# Patient Record
Sex: Female | Born: 1960 | Race: White | Hispanic: No | Smoking: Never smoker
Health system: Southern US, Community
[De-identification: ages and names within clinical notes are randomized; demographics above are authoritative.]

## PROBLEM LIST (undated history)

## (undated) DIAGNOSIS — R1900 Intra-abdominal and pelvic swelling, mass and lump, unspecified site: Secondary | ICD-10-CM

## (undated) DIAGNOSIS — N809 Endometriosis, unspecified: Secondary | ICD-10-CM

## (undated) DIAGNOSIS — M436 Torticollis: Secondary | ICD-10-CM

## (undated) DIAGNOSIS — F418 Other specified anxiety disorders: Secondary | ICD-10-CM

## (undated) DIAGNOSIS — F419 Anxiety disorder, unspecified: Secondary | ICD-10-CM

## (undated) DIAGNOSIS — C801 Malignant (primary) neoplasm, unspecified: Secondary | ICD-10-CM

## (undated) DIAGNOSIS — N80129 Deep endometriosis of ovary, unspecified ovary: Secondary | ICD-10-CM

## (undated) HISTORY — DX: Malignant (primary) neoplasm, unspecified: C80.1

## (undated) HISTORY — DX: Other specified anxiety disorders: F41.8

## (undated) HISTORY — PX: OTHER SURGICAL HISTORY: SHX169

---

## 1998-03-18 ENCOUNTER — Encounter: Payer: Self-pay | Admitting: Obstetrics and Gynecology

## 1998-03-18 ENCOUNTER — Ambulatory Visit (HOSPITAL_COMMUNITY): Admission: RE | Admit: 1998-03-18 | Discharge: 1998-03-18 | Payer: Self-pay | Admitting: Obstetrics and Gynecology

## 2000-01-23 ENCOUNTER — Other Ambulatory Visit: Admission: RE | Admit: 2000-01-23 | Discharge: 2000-01-23 | Payer: Self-pay | Admitting: Gynecology

## 2001-03-29 ENCOUNTER — Other Ambulatory Visit: Admission: RE | Admit: 2001-03-29 | Discharge: 2001-03-29 | Payer: Self-pay | Admitting: Gynecology

## 2001-10-03 ENCOUNTER — Ambulatory Visit (HOSPITAL_COMMUNITY): Admission: RE | Admit: 2001-10-03 | Discharge: 2001-10-03 | Payer: Self-pay | Admitting: Family Medicine

## 2001-10-03 ENCOUNTER — Encounter: Payer: Self-pay | Admitting: Family Medicine

## 2001-10-18 ENCOUNTER — Encounter: Admission: RE | Admit: 2001-10-18 | Discharge: 2001-10-18 | Payer: Self-pay | Admitting: Family Medicine

## 2001-10-18 ENCOUNTER — Encounter: Payer: Self-pay | Admitting: Family Medicine

## 2001-11-29 ENCOUNTER — Encounter: Payer: Self-pay | Admitting: Family Medicine

## 2001-11-29 ENCOUNTER — Encounter: Admission: RE | Admit: 2001-11-29 | Discharge: 2001-11-29 | Payer: Self-pay | Admitting: Family Medicine

## 2002-07-18 ENCOUNTER — Other Ambulatory Visit: Admission: RE | Admit: 2002-07-18 | Discharge: 2002-07-18 | Payer: Self-pay | Admitting: Gynecology

## 2003-10-09 ENCOUNTER — Other Ambulatory Visit: Admission: RE | Admit: 2003-10-09 | Discharge: 2003-10-09 | Payer: Self-pay | Admitting: Gynecology

## 2005-03-02 ENCOUNTER — Other Ambulatory Visit: Admission: RE | Admit: 2005-03-02 | Discharge: 2005-03-02 | Payer: Self-pay | Admitting: Gynecology

## 2005-03-30 ENCOUNTER — Ambulatory Visit (HOSPITAL_BASED_OUTPATIENT_CLINIC_OR_DEPARTMENT_OTHER): Admission: RE | Admit: 2005-03-30 | Discharge: 2005-03-30 | Payer: Self-pay | Admitting: Gynecology

## 2005-03-30 HISTORY — PX: OTHER SURGICAL HISTORY: SHX169

## 2006-05-12 ENCOUNTER — Other Ambulatory Visit: Admission: RE | Admit: 2006-05-12 | Discharge: 2006-05-12 | Payer: Self-pay | Admitting: Gynecology

## 2010-02-02 ENCOUNTER — Encounter: Payer: Self-pay | Admitting: Family Medicine

## 2010-06-17 ENCOUNTER — Other Ambulatory Visit: Payer: Self-pay | Admitting: Gynecology

## 2010-08-11 ENCOUNTER — Other Ambulatory Visit: Payer: Self-pay | Admitting: Physician Assistant

## 2010-08-11 ENCOUNTER — Ambulatory Visit
Admission: RE | Admit: 2010-08-11 | Discharge: 2010-08-11 | Disposition: A | Discharge status: Home / Self Care | Payer: 59 | Source: Ambulatory Visit | Attending: Physician Assistant | Admitting: Physician Assistant

## 2010-08-11 DIAGNOSIS — M542 Cervicalgia: Secondary | ICD-10-CM

## 2011-05-17 ENCOUNTER — Inpatient Hospital Stay (HOSPITAL_COMMUNITY): Payer: 59

## 2011-05-17 ENCOUNTER — Emergency Department (HOSPITAL_COMMUNITY)
Admission: EM | Admit: 2011-05-17 | Discharge: 2011-05-17 | Disposition: A | Discharge status: Home / Self Care | Payer: 59 | Source: Home / Self Care | Attending: Family Medicine | Admitting: Family Medicine

## 2011-05-17 ENCOUNTER — Inpatient Hospital Stay (HOSPITAL_COMMUNITY)
Admission: AD | Admit: 2011-05-17 | Discharge: 2011-05-18 | Disposition: A | Discharge status: Home / Self Care | Payer: 59 | Source: Ambulatory Visit | Attending: Gynecology | Admitting: Gynecology

## 2011-05-17 ENCOUNTER — Encounter (HOSPITAL_COMMUNITY): Payer: Self-pay | Admitting: *Deleted

## 2011-05-17 DIAGNOSIS — D259 Leiomyoma of uterus, unspecified: Secondary | ICD-10-CM | POA: Insufficient documentation

## 2011-05-17 DIAGNOSIS — R109 Unspecified abdominal pain: Secondary | ICD-10-CM | POA: Insufficient documentation

## 2011-05-17 DIAGNOSIS — R102 Pelvic and perineal pain: Secondary | ICD-10-CM

## 2011-05-17 DIAGNOSIS — N83209 Unspecified ovarian cyst, unspecified side: Secondary | ICD-10-CM

## 2011-05-17 DIAGNOSIS — N83201 Unspecified ovarian cyst, right side: Secondary | ICD-10-CM

## 2011-05-17 DIAGNOSIS — N949 Unspecified condition associated with female genital organs and menstrual cycle: Secondary | ICD-10-CM

## 2011-05-17 HISTORY — DX: Anxiety disorder, unspecified: F41.9

## 2011-05-17 LAB — DIFFERENTIAL
Eosinophils Absolute: 0 10*3/uL (ref 0.0–0.7)
Eosinophils Relative: 0 % (ref 0–5)
Lymphocytes Relative: 10 % — ABNORMAL LOW (ref 12–46)
Lymphs Abs: 1.1 10*3/uL (ref 0.7–4.0)
Monocytes Relative: 5 % (ref 3–12)

## 2011-05-17 LAB — POCT URINALYSIS DIP (DEVICE)
Glucose, UA: NEGATIVE mg/dL
Ketones, ur: >=160 mg/dL — AB
Specific Gravity, Urine: 1.025 (ref 1.005–1.030)

## 2011-05-17 LAB — CBC
HCT: 42.2 % (ref 36.0–46.0)
Hemoglobin: 14.1 g/dL (ref 12.0–15.0)
MCH: 30.7 pg (ref 26.0–34.0)
MCHC: 33.4 g/dL (ref 30.0–36.0)
MCV: 91.7 fL (ref 78.0–100.0)
Platelets: 334 10*3/uL (ref 150–400)
RBC: 4.6 MIL/uL (ref 3.87–5.11)
RDW: 13.1 % (ref 11.5–15.5)
WBC: 11.2 10*3/uL — ABNORMAL HIGH (ref 4.0–10.5)

## 2011-05-17 NOTE — MAU Note (Signed)
Pt in c/o 4-5 episodes of right sided mid abdominal "spasm-like" pain since yesterday around 1730.  States she had not had a period in over a year besides a couple times of spotting for one day.  States she started having a normal period 9 days ago, now starting to subside.  Was seen at urgent care and told to come over here.

## 2011-05-17 NOTE — ED Provider Notes (Signed)
History     CSN: [id number]  Arrival date & time 05/17/11  1656   First MD Initiated Contact with Patient 05/17/11 1713      Chief Complaint  Patient presents with  . Abdominal Pain    (Consider location/radiation/quality/duration/timing/severity/associated sxs/prior treatment) Patient is a 51 y.o. female presenting with abdominal pain. The history is provided by the patient.  Abdominal Pain The primary symptoms of the illness include abdominal pain, fever, diarrhea and vaginal bleeding. The primary symptoms of the illness do not include fatigue, nausea, vomiting or vaginal discharge. The current episode started yesterday. The onset of the illness was sudden. The problem has been gradually worsening.  Associated with: very vague about lnmp being more or less than 1 yr. The patient states that she believes she is currently not pregnant. The patient has had a change in bowel habit. Additional symptoms associated with the illness include anorexia.    Past Medical History  Diagnosis Date  . Anxiety   . Fibroid     Past Surgical History  Procedure Date  . Cystectomy     History reviewed. No pertinent family history.  History  Substance Use Topics  . Smoking status: Never Smoker   . Smokeless tobacco: Not on file  . Alcohol Use: Yes    OB History    Grav Para Term Preterm Abortions TAB SAB Ect Mult Living                  Review of Systems  Constitutional: Positive for fever. Negative for fatigue.  Gastrointestinal: Positive for abdominal pain, diarrhea and anorexia. Negative for nausea and vomiting.  Genitourinary: Positive for vaginal bleeding and pelvic pain. Negative for flank pain, vaginal discharge and vaginal pain.    Allergies  Review of patient's allergies indicates no known allergies.  Home Medications   Current Outpatient Rx  Name Route Sig Dispense Refill  . ACETAMINOPHEN 325 MG PO TABS Oral Take 325 mg by mouth every 6 (six) hours as needed.    .  ALPRAZOLAM 0.5 MG PO TABS Oral Take 0.5 mg by mouth 1 day or 1 dose.      BP 145/85  Pulse 104  Temp(Src) 99.3 F (37.4 C) (Oral)  Resp 21  SpO2 100%  LMP 05/08/2011  Physical Exam  Nursing note and vitals reviewed. Constitutional: She appears well-developed and well-nourished.  Abdominal: Soft. Bowel sounds are normal. She exhibits distension. There is tenderness. There is guarding.    Genitourinary:       ED Course  Procedures (including critical care time)  Labs Reviewed  POCT URINALYSIS DIP (DEVICE) - Abnormal; Notable for the following:    Bilirubin Urine SMALL (*)    Ketones, ur >=160 (*)    Hgb urine dipstick SMALL (*)    Protein, ur 30 (*)    All other components within normal limits   No results found.   1. Acute pelvic pain, female       MDM          Linna Hoff, MD 05/17/11 1820

## 2011-05-17 NOTE — MAU Provider Note (Signed)
History     CSN: [id number]  Arrival date & time 05/17/11  9562   None     Chief Complaint  Patient presents with  . Abdominal Pain    (Consider location/radiation/quality/duration/timing/severity/associated sxs/prior treatment) HPI Kathryn [name] is a 51 y.o.  [id number]. She presents with R side/mid apin since 5/4 starting around 5:30 pm. She has had 3-4 episode sof extreme pain, unabl eto move. It hurst to move or laugh. No change in discharge, UTI S&S. Feel nauseated and like has low grade fever with thep ain. She had 1st menses in 1 yr 4/26 x 7-9 days, changed a pad 1-2x/day, occ clot , no cramping. Last sexual contact 8-9 yr ago. Known uterine fibroids.  Past Medical History  Diagnosis Date  . Anxiety   . Fibroid     Past Surgical History  Procedure Date  . Cystectomy     Family History  Problem Relation Age of Onset  . Anesthesia problems Neg Hx     History  Substance Use Topics  . Smoking status: Never Smoker   . Smokeless tobacco: Not on file  . Alcohol Use: Yes    OB History    Grav Para Term Preterm Abortions TAB SAB Ect Mult Living   3 2 2  0 1 0 1 0 0 2      Review of Systems  Constitutional: Negative for fever and chills.  Gastrointestinal:       Abd pain  Genitourinary: Negative for vaginal bleeding, vaginal discharge and vaginal pain.    Allergies  Review of patient's allergies indicates no known allergies.  Home Medications  No current outpatient prescriptions on file.  BP 146/93  Pulse 108  Temp(Src) 100.9 F (38.3 C) (Oral)  Resp 18  Ht 5' 2.5" (1.588 m)  Wt 140 lb (63.504 kg)  BMI 25.20 kg/m2  LMP 05/08/2011  Physical Exam  Constitutional: She is oriented to person, place, and time. She appears well-developed and well-nourished. No distress.  HENT:  Head: Normocephalic.  Cardiovascular: Normal rate.   Pulmonary/Chest: Effort normal.  Abdominal: Soft. She exhibits no distension and no mass. There is tenderness (Right mid  abdomen). There is no rebound and no guarding.  Genitourinary: Vagina normal and uterus normal. No vaginal discharge (No blood) found.       Uterus enlarged, retroverted  Musculoskeletal: Normal range of motion.  Neurological: She is alert and oriented to person, place, and time.  Skin: Skin is warm and dry.  Psychiatric: She has a normal mood and affect.    ED Course  Procedures (including critical care time)   Labs Reviewed  CBC   [place] Results:  Renal US shows small cyst on left kidney, normal on right with bilateral jets. US Renal  05/17/2011  The *RADIOLOGY REPORT*  Clinical Data: Right abdominal pain.  RENAL / URINARY TRACT ULTRASOUND  Technique:  Complete ultrasound exam of the kidneys and urinary bladder was performed.  Comparison: No comparison studies available.  Findings:  The right kidney measures 10.1 cm in Erler axis.  The left kidney measures 10.3 cm.  The right kidney is unremarkable.  16 mm cyst is identified in the upper pole of the left kidney.  Midline imaging of the pelvis reveals an unremarkable urinary bladder.  Impression:  Small left renal cyst.  Otherwise unremarkable urinary tract ultrasound.  Original Report Authenticated By: ERIC A. MANSELL, M.D.   Pelvic US shows bilateral complex ovarian cysts, two on left, one on right. 4 fibroids,  all around 2cm.  MDM  Care to Artelia Laroche, CNM Discussed with Dr Audie Box He feels it is probably GI Will check pelvic and renal ultrasound. If normal will have pt go home on supportive care and if pain worsens go to [facility] ED  A:  Abdominal pain      Bilateral Complex ovarian cysts      Uterine Fibroids P:  DIscharge home.       Instructed to call Dr Nicholas Lose tomorrow to schedule f/u appt        To go to ED if pain or other symptoms worsen.

## 2011-05-17 NOTE — ED Notes (Signed)
[facility] evaluation vaginal bleeding and abd pain - report given to [place] [facility]U

## 2011-05-17 NOTE — ED Notes (Signed)
Pt with onset of right sided abdominal pain yesterday 530pm - pt describes pain as a spasm pain radiates across abdomen - nausea with pain - abdomen tender  To palpation right mid to lower abd - pt has had no solid foods today drinking water and coffee - denies urinary symptoms - denies constipation - BM x 2 today last loose - per pt had not had a period over one year -  Lasted approx 9 days pt had been taking flax seed thought had brought period on - continues to have very light brownish discharge

## 2011-05-17 NOTE — Discharge Instructions (Signed)
Go directly to [facility] for further eval., do not eat or drink  Anything  Until seen.

## 2011-05-17 NOTE — ED Notes (Signed)
Dr Artis Flock spoke with NP at [facility]

## 2011-05-17 NOTE — MAU Note (Signed)
Pt sent from urgent care c/o having pain on mid lower right side tha radiates down towads her pelvis. Had not had a period for a little over a year and then started having vaginal bleeding 9 days ago that has since slowed down and more brownish (lasted like a period) stated she had satrted taking flax seed oil prior to the vaginal bleeding started.

## 2011-05-18 ENCOUNTER — Telehealth: Payer: Self-pay | Admitting: Gynecology

## 2011-05-18 LAB — POCT PREGNANCY, URINE: Preg Test, Ur: NEGATIVE

## 2011-05-18 MED ORDER — HYDROCODONE-ACETAMINOPHEN 5-325 MG PO TABS: 1.0000 | ORAL_TABLET | Freq: Four times a day (QID) | ORAL | Status: AC | PRN

## 2011-05-18 NOTE — Discharge Instructions (Signed)
Ovarian Cyst The ovaries are small organs that are on each side of the uterus. The ovaries are the organs that produce the female hormones, estrogen and progesterone. An ovarian cyst is a sac filled with fluid that can vary in its size. It is normal for a small cyst to form in women who are in the childbearing age and who have menstrual periods. This type of cyst is called a follicle cyst that becomes an ovulation cyst (corpus luteum cyst) after it produces the women's egg. It later goes away on its own if the woman does not become pregnant. There are other kinds of ovarian cysts that may cause problems and may need to be treated. The most serious problem is a cyst with cancer. It should be noted that menopausal women who have an ovarian cyst are at a higher risk of it being a cancer cyst. They should be evaluated very quickly, thoroughly and followed closely. This is especially true in menopausal women because of the high rate of ovarian cancer in women in menopause. CAUSES AND TYPES OF OVARIAN CYSTS:  FUNCTIONAL CYST: The follicle/corpus luteum cyst is a functional cyst that occurs every month during ovulation with the menstrual cycle. They go away with the next menstrual cycle if the woman does not get pregnant. Usually, there are no symptoms with a functional cyst.   ENDOMETRIOMA CYST: This cyst develops from the lining of the uterus tissue. This cyst gets in or on the ovary. It grows every month from the bleeding during the menstrual period. It is also called a "chocolate cyst" because it becomes filled with blood that turns brown. This cyst can cause pain in the lower abdomen during intercourse and with your menstrual period.   CYSTADENOMA CYST: This cyst develops from the cells on the outside of the ovary. They usually are not cancerous. They can get very big and cause lower abdomen pain and pain with intercourse. This type of cyst can twist on itself, cut off its blood supply and cause severe pain.  It also can easily rupture and cause a lot of pain.   DERMOID CYST: This type of cyst is sometimes found in both ovaries. They are found to have different kinds of body tissue in the cyst. The tissue includes skin, teeth, hair, and/or cartilage. They usually do not have symptoms unless they get very big. Dermoid cysts are rarely cancerous.   POLYCYSTIC OVARY: This is a rare condition with hormone problems that produces many small cysts on both ovaries. The cysts are follicle-like cysts that never produce an egg and become a corpus luteum. It can cause an increase in body weight, infertility, acne, increase in body and facial hair and lack of menstrual periods or rare menstrual periods. Many women with this problem develop type 2 diabetes. The exact cause of this problem is unknown. A polycystic ovary is rarely cancerous.   THECA LUTEIN CYST: Occurs when too much hormone (human chorionic gonadotropin) is produced and over-stimulates the ovaries to produce an egg. They are frequently seen when doctors stimulate the ovaries for invitro-fertilization (test tube babies).   LUTEOMA CYST: This cyst is seen during pregnancy. Rarely it can cause an obstruction to the birth canal during labor and delivery. They usually go away after delivery.  SYMPTOMS   Pelvic pain or pressure.   Pain during sexual intercourse.   Increasing girth (swelling) of the abdomen.   Abnormal menstrual periods.   Increasing pain with menstrual periods.   You stop having   menstrual periods and you are not pregnant.  DIAGNOSIS  The diagnosis can be made during:  Routine or annual pelvic examination (common).   Ultrasound.   X-ray of the pelvis.   CT Scan.   MRI.   Blood tests.  TREATMENT   Treatment may only be to follow the cyst monthly for 2 to 3 months with your caregiver. Many go away on their own, especially functional cysts.   May be aspirated (drained) with a Giammarco needle with ultrasound, or by laparoscopy  (inserting a tube into the pelvis through a small incision).   The whole cyst can be removed by laparoscopy.   Sometimes the cyst may need to be removed through an incision in the lower abdomen.   Hormone treatment is sometimes used to help dissolve certain cysts.   Birth control pills are sometimes used to help dissolve certain cysts.  HOME CARE INSTRUCTIONS  Follow your caregiver's advice regarding:  Medicine.   Follow up visits to evaluate and treat the cyst.   You may need to come back or make an appointment with another caregiver, to find the exact cause of your cyst, if your caregiver is not a gynecologist.   Get your yearly and recommended pelvic examinations and Pap tests.   Let your caregiver know if you have had an ovarian cyst in the past.  SEEK MEDICAL CARE IF:   Your periods are late, irregular, they stop, or are painful.   Your stomach (abdomen) or pelvic pain does not go away.   Your stomach becomes larger or swollen.   You have pressure on your bladder or trouble emptying your bladder completely.   You have painful sexual intercourse.   You have feelings of fullness, pressure, or discomfort in your stomach.   You lose weight for no apparent reason.   You feel generally ill.   You become constipated.   You lose your appetite.   You develop acne.   You have an increase in body and facial hair.   You are gaining weight, without changing your exercise and eating habits.   You think you are pregnant.  SEEK IMMEDIATE MEDICAL CARE IF:   You have increasing abdominal pain.   You feel sick to your stomach (nausea) and/or vomit.   You develop a fever that comes on suddenly.   You develop abdominal pain during a bowel movement.   Your menstrual periods become heavier than usual.  Document Released: 12/29/2004 Document Revised: 12/18/2010 Document Reviewed: 11/01/2008 Exit[employer] Patient Information 2012 [employer]. 

## 2011-05-18 NOTE — Telephone Encounter (Signed)
Pt informed with the below note, u/s results faxed to Dr. Nicholas Lose office. Pt will follow up as directed.

## 2011-05-18 NOTE — Telephone Encounter (Signed)
Please call the patient.  I was on call last night when the patient was evaluated in the triage area by the nurse practitioner. Her ultrasound showed cysts on both of her ovaries which may account for her pain and these definitely need to be followed up. I know that they told her to follow up with Dr. Nicholas Lose but I want to emphasize the need to do so to rule out ovarian tumors and that she needs to call his office to make an appointment to see him. I also want a copy of the ultrasound report faxed to his office as he does not have access to EMR.

## 2011-05-19 ENCOUNTER — Other Ambulatory Visit: Payer: Self-pay | Admitting: Gynecology

## 2011-05-19 DIAGNOSIS — N9489 Other specified conditions associated with female genital organs and menstrual cycle: Secondary | ICD-10-CM

## 2011-05-26 ENCOUNTER — Ambulatory Visit
Admission: RE | Admit: 2011-05-26 | Discharge: 2011-05-26 | Disposition: A | Discharge status: Home / Self Care | Payer: 59 | Source: Ambulatory Visit | Attending: Gynecology | Admitting: Gynecology

## 2011-05-26 DIAGNOSIS — N9489 Other specified conditions associated with female genital organs and menstrual cycle: Secondary | ICD-10-CM

## 2011-05-26 MED ORDER — GADOBENATE DIMEGLUMINE 529 MG/ML IV SOLN
12.0000 mL | Freq: Once | INTRAVENOUS | Status: AC | PRN
  Administered 2011-05-26: 12 mL via INTRAVENOUS

## 2011-06-22 ENCOUNTER — Encounter (HOSPITAL_BASED_OUTPATIENT_CLINIC_OR_DEPARTMENT_OTHER): Payer: Self-pay | Admitting: *Deleted

## 2011-06-22 NOTE — Progress Notes (Signed)
NPO AFTER MN. ARRIVES AT 0615. NEEDS HG AND EKG. WILL TAKE XANAX AM OF SURG W/ SIP OF WATER. REVIEWED [facility] GUIDELINES.  MD PRE-OP ORDERS PENDING.

## 2011-06-29 ENCOUNTER — Ambulatory Visit (HOSPITAL_BASED_OUTPATIENT_CLINIC_OR_DEPARTMENT_OTHER): Payer: 59 | Admitting: Anesthesiology

## 2011-06-29 ENCOUNTER — Ambulatory Visit (HOSPITAL_BASED_OUTPATIENT_CLINIC_OR_DEPARTMENT_OTHER)
Admission: RE | Admit: 2011-06-29 | Discharge: 2011-06-30 | Disposition: A | Discharge status: Home / Self Care | Payer: 59 | Source: Ambulatory Visit | Attending: Gynecology | Admitting: Gynecology

## 2011-06-29 ENCOUNTER — Encounter (HOSPITAL_BASED_OUTPATIENT_CLINIC_OR_DEPARTMENT_OTHER): Payer: Self-pay | Admitting: Anesthesiology

## 2011-06-29 ENCOUNTER — Encounter (HOSPITAL_BASED_OUTPATIENT_CLINIC_OR_DEPARTMENT_OTHER)
Admission: RE | Disposition: A | Discharge status: Home / Self Care | Payer: Self-pay | Source: Ambulatory Visit | Attending: Gynecology

## 2011-06-29 ENCOUNTER — Encounter (HOSPITAL_BASED_OUTPATIENT_CLINIC_OR_DEPARTMENT_OTHER): Payer: Self-pay | Admitting: *Deleted

## 2011-06-29 DIAGNOSIS — N80109 Endometriosis of ovary, unspecified side, unspecified depth: Secondary | ICD-10-CM | POA: Insufficient documentation

## 2011-06-29 DIAGNOSIS — R52 Pain, unspecified: Secondary | ICD-10-CM | POA: Insufficient documentation

## 2011-06-29 DIAGNOSIS — D250 Submucous leiomyoma of uterus: Secondary | ICD-10-CM | POA: Insufficient documentation

## 2011-06-29 DIAGNOSIS — N736 Female pelvic peritoneal adhesions (postinfective): Secondary | ICD-10-CM | POA: Insufficient documentation

## 2011-06-29 DIAGNOSIS — N801 Endometriosis of ovary: Secondary | ICD-10-CM | POA: Insufficient documentation

## 2011-06-29 DIAGNOSIS — R109 Unspecified abdominal pain: Secondary | ICD-10-CM | POA: Insufficient documentation

## 2011-06-29 DIAGNOSIS — D251 Intramural leiomyoma of uterus: Secondary | ICD-10-CM | POA: Insufficient documentation

## 2011-06-29 HISTORY — PX: ABDOMINAL HYSTERECTOMY: SHX81

## 2011-06-29 HISTORY — DX: Intra-abdominal and pelvic swelling, mass and lump, unspecified site: R19.00

## 2011-06-29 HISTORY — PX: SALPINGOOPHORECTOMY: SHX82

## 2011-06-29 HISTORY — DX: Torticollis: M43.6

## 2011-06-29 HISTORY — PX: LAPAROTOMY: SHX154

## 2011-06-29 HISTORY — PX: ENDOMETRIAL ABLATION: SHX621

## 2011-06-29 HISTORY — DX: Endometriosis, unspecified: N80.9

## 2011-06-29 HISTORY — DX: Deep endometriosis of ovary, unspecified ovary: N80.129

## 2011-06-29 LAB — CBC
HCT: 41 % (ref 36.0–46.0)
Platelets: 229 10*3/uL (ref 150–400)
RDW: 13 % (ref 11.5–15.5)
WBC: 12.7 10*3/uL — ABNORMAL HIGH (ref 4.0–10.5)

## 2011-06-29 LAB — POCT HEMOGLOBIN-HEMACUE: Hemoglobin: 15.1 g/dL — ABNORMAL HIGH (ref 12.0–15.0)

## 2011-06-29 SURGERY — HYSTERECTOMY, ABDOMINAL
Anesthesia: General | Site: Uterus | Wound class: Clean

## 2011-06-29 MED ORDER — DEXAMETHASONE SODIUM PHOSPHATE 4 MG/ML IJ SOLN
INTRAMUSCULAR | Status: DC | PRN
  Administered 2011-06-29: 10 mg via INTRAVENOUS

## 2011-06-29 MED ORDER — DROPERIDOL 2.5 MG/ML IJ SOLN
INTRAMUSCULAR | Status: DC | PRN
  Administered 2011-06-29: 0.625 mg via INTRAVENOUS

## 2011-06-29 MED ORDER — OXYCODONE HCL 5 MG PO TABS
5.0000 mg | ORAL_TABLET | ORAL | Status: DC | PRN
  Administered 2011-06-29 – 2011-06-30 (×4): 5 mg via ORAL

## 2011-06-29 MED ORDER — ONDANSETRON HCL 4 MG/2ML IJ SOLN: 4.0000 mg | Freq: Four times a day (QID) | INTRAMUSCULAR | Status: DC | PRN

## 2011-06-29 MED ORDER — ALPRAZOLAM 0.5 MG PO TABS: 0.5000 mg | ORAL_TABLET | Freq: Four times a day (QID) | ORAL | Status: DC

## 2011-06-29 MED ORDER — STERILE WATER FOR IRRIGATION IR SOLN
Status: DC | PRN
  Administered 2011-06-29: 500 mL

## 2011-06-29 MED ORDER — SODIUM CHLORIDE 0.9 % IR SOLN
Status: DC | PRN
  Administered 2011-06-29: 500 mL

## 2011-06-29 MED ORDER — SODIUM CHLORIDE 0.9 % IJ SOLN: 9.0000 mL | INTRAMUSCULAR | Status: DC | PRN

## 2011-06-29 MED ORDER — DEXTROSE-NACL 5-0.45 % IV SOLN
INTRAVENOUS | Status: DC
  Administered 2011-06-29: 15:00:00 via INTRAVENOUS

## 2011-06-29 MED ORDER — MEPERIDINE HCL 25 MG/ML IJ SOLN: 6.2500 mg | INTRAMUSCULAR | Status: DC | PRN

## 2011-06-29 MED ORDER — PROMETHAZINE HCL 25 MG/ML IJ SOLN: 12.5000 mg | Freq: Four times a day (QID) | INTRAMUSCULAR | Status: DC | PRN

## 2011-06-29 MED ORDER — LACTATED RINGERS IV SOLN: INTRAVENOUS | Status: DC

## 2011-06-29 MED ORDER — EPHEDRINE SULFATE 50 MG/ML IJ SOLN
INTRAMUSCULAR | Status: DC | PRN
  Administered 2011-06-29: 10 mg via INTRAVENOUS

## 2011-06-29 MED ORDER — GLYCOPYRROLATE 0.2 MG/ML IJ SOLN
INTRAMUSCULAR | Status: DC | PRN
  Administered 2011-06-29: 0.6 mg via INTRAVENOUS

## 2011-06-29 MED ORDER — NEOSTIGMINE METHYLSULFATE 1 MG/ML IJ SOLN
INTRAMUSCULAR | Status: DC | PRN
  Administered 2011-06-29: 5 mg via INTRAVENOUS

## 2011-06-29 MED ORDER — FENTANYL CITRATE 0.05 MG/ML IJ SOLN
INTRAMUSCULAR | Status: DC | PRN
  Administered 2011-06-29 (×4): 50 ug via INTRAVENOUS
  Administered 2011-06-29: 100 ug via INTRAVENOUS
  Administered 2011-06-29 (×3): 50 ug via INTRAVENOUS

## 2011-06-29 MED ORDER — LIDOCAINE HCL (CARDIAC) 20 MG/ML IV SOLN
INTRAVENOUS | Status: DC | PRN
  Administered 2011-06-29: 60 mg via INTRAVENOUS

## 2011-06-29 MED ORDER — PROPOFOL 10 MG/ML IV EMUL
INTRAVENOUS | Status: DC | PRN
  Administered 2011-06-29: 200 mg via INTRAVENOUS

## 2011-06-29 MED ORDER — ONDANSETRON HCL 4 MG PO TABS: 4.0000 mg | ORAL_TABLET | Freq: Four times a day (QID) | ORAL | Status: DC | PRN

## 2011-06-29 MED ORDER — DIPHENHYDRAMINE HCL 50 MG/ML IJ SOLN: 12.5000 mg | Freq: Four times a day (QID) | INTRAMUSCULAR | Status: DC | PRN

## 2011-06-29 MED ORDER — CELECOXIB 200 MG PO CAPS
200.0000 mg | ORAL_CAPSULE | Freq: Once | ORAL | Status: AC
  Administered 2011-06-29: 200 mg via ORAL

## 2011-06-29 MED ORDER — DEXTROSE 5 % IV SOLN: 2.0000 g | INTRAVENOUS | Status: AC

## 2011-06-29 MED ORDER — MIDAZOLAM HCL 5 MG/5ML IJ SOLN
INTRAMUSCULAR | Status: DC | PRN
  Administered 2011-06-29: 2 mg via INTRAVENOUS

## 2011-06-29 MED ORDER — ROCURONIUM BROMIDE 100 MG/10ML IV SOLN
INTRAVENOUS | Status: DC | PRN
  Administered 2011-06-29: 40 mg via INTRAVENOUS
  Administered 2011-06-29: 5 mg via INTRAVENOUS

## 2011-06-29 MED ORDER — NALOXONE HCL 0.4 MG/ML IJ SOLN: 0.4000 mg | INTRAMUSCULAR | Status: DC | PRN

## 2011-06-29 MED ORDER — ONDANSETRON HCL 4 MG/2ML IJ SOLN
INTRAMUSCULAR | Status: DC | PRN
  Administered 2011-06-29: 4 mg via INTRAVENOUS

## 2011-06-29 MED ORDER — HYDROMORPHONE 0.3 MG/ML IV SOLN
INTRAVENOUS | Status: DC
  Administered 2011-06-29: 13:00:00 via INTRAVENOUS

## 2011-06-29 MED ORDER — ACETAMINOPHEN 10 MG/ML IV SOLN
INTRAVENOUS | Status: DC | PRN
  Administered 2011-06-29: 1000 mg via INTRAVENOUS

## 2011-06-29 MED ORDER — ACETAMINOPHEN 10 MG/ML IV SOLN
1000.0000 mg | Freq: Four times a day (QID) | INTRAVENOUS | Status: DC
  Administered 2011-06-29 (×2): 1000 mg via INTRAVENOUS

## 2011-06-29 MED ORDER — DIPHENHYDRAMINE HCL 12.5 MG/5ML PO ELIX: 12.5000 mg | ORAL_SOLUTION | Freq: Four times a day (QID) | ORAL | Status: DC | PRN

## 2011-06-29 MED ORDER — PROMETHAZINE HCL 25 MG/ML IJ SOLN: 6.2500 mg | INTRAMUSCULAR | Status: DC | PRN

## 2011-06-29 MED ORDER — LACTATED RINGERS IV SOLN
INTRAVENOUS | Status: DC
  Administered 2011-06-29 (×3): via INTRAVENOUS

## 2011-06-29 MED ORDER — HYDROMORPHONE HCL PF 1 MG/ML IJ SOLN
0.2500 mg | INTRAMUSCULAR | Status: DC | PRN
  Administered 2011-06-29 (×2): 0.25 mg via INTRAVENOUS

## 2011-06-29 MED ORDER — DEXTROSE 5 % IV SOLN
1.0000 g | INTRAVENOUS | Status: DC | PRN
  Administered 2011-06-29: 2 g via INTRAVENOUS

## 2011-06-29 SURGICAL SUPPLY — 49 items
APL SKNCLS STERI-STRIP NONHPOA (GAUZE/BANDAGES/DRESSINGS) ×3
BAG URINE DRAINAGE (UROLOGICAL SUPPLIES) ×4 IMPLANT
BENZOIN TINCTURE PRP APPL 2/3 (GAUZE/BANDAGES/DRESSINGS) ×4 IMPLANT
BLADE HEX COATED 2.75 (ELECTRODE) ×4 IMPLANT
BLADE SURG 10 STRL SS (BLADE) ×8 IMPLANT
CANISTER SUCTION 2500CC (MISCELLANEOUS) ×4 IMPLANT
CATH FOLEY 2WAY SLVR  5CC 16FR (CATHETERS) ×1
CATH FOLEY 2WAY SLVR 5CC 16FR (CATHETERS) ×3 IMPLANT
CLEANER CAUTERY TIP 5X5 PAD (MISCELLANEOUS) ×3 IMPLANT
CLOTH BEACON ORANGE TIMEOUT ST (SAFETY) ×4 IMPLANT
COVER MAYO STAND STRL (DRAPES) ×4 IMPLANT
COVER TABLE BACK 60X90 (DRAPES) ×4 IMPLANT
DECANTER SPIKE VIAL GLASS SM (MISCELLANEOUS) IMPLANT
DRAPE LAPAROTOMY TRNSV 102X78 (DRAPE) ×4 IMPLANT
DRAPE WARM FLUID 44X44 (DRAPE) ×4 IMPLANT
GAUZE SPONGE 4X4 12PLY STRL LF (GAUZE/BANDAGES/DRESSINGS) ×4 IMPLANT
GLOVE BIO SURGEON STRL SZ8 (GLOVE) ×8 IMPLANT
GLOVE BIOGEL PI IND STRL 6.5 (GLOVE) ×3 IMPLANT
GLOVE BIOGEL PI INDICATOR 6.5 (GLOVE) ×1
GLOVE ECLIPSE 6.0 STRL STRAW (GLOVE) ×4 IMPLANT
GLOVE ECLIPSE 7.5 STRL STRAW (GLOVE) ×4 IMPLANT
GOWN PREVENTION PLUS LG XLONG (DISPOSABLE) ×8 IMPLANT
GOWN SURGICAL LARGE (GOWNS) ×4 IMPLANT
HOLDER FOLEY CATH W/STRAP (MISCELLANEOUS) ×4 IMPLANT
NEEDLE HYPO 25X1 1.5 SAFETY (NEEDLE) IMPLANT
NS IRRIG 500ML POUR BTL (IV SOLUTION) ×12 IMPLANT
PACK BASIN DAY SURGERY FS (CUSTOM PROCEDURE TRAY) ×4 IMPLANT
PAD CLEANER CAUTERY TIP 5X5 (MISCELLANEOUS) ×1
PAD OB MATERNITY 4.3X12.25 (PERSONAL CARE ITEMS) ×4 IMPLANT
PENCIL BUTTON HOLSTER BLD 10FT (ELECTRODE) ×4 IMPLANT
SPONGE GAUZE 4X4 12PLY (GAUZE/BANDAGES/DRESSINGS) ×4 IMPLANT
SPONGE LAP 18X18 X RAY DECT (DISPOSABLE) ×8 IMPLANT
STAPLER VISISTAT 35W (STAPLE) ×4 IMPLANT
STRIP CLOSURE SKIN 1/2X4 (GAUZE/BANDAGES/DRESSINGS) IMPLANT
STRIP CLOSURE SKIN 1/4X4 (GAUZE/BANDAGES/DRESSINGS) ×4 IMPLANT
SUT MON AB 2-0 SH 27 (SUTURE) ×2
SUT MON AB 2-0 SH27 (SUTURE) ×3 IMPLANT
SUT MON AB-0 CT1 36 (SUTURE) ×4 IMPLANT
SUT VIC AB 0 CT1 36 (SUTURE) ×12 IMPLANT
SUT VIC AB 3-0 CTX 36 (SUTURE) ×4 IMPLANT
SUT VICRYL 0 TIES 12 18 (SUTURE) ×4 IMPLANT
SYR BULB IRRIGATION 50ML (SYRINGE) ×4 IMPLANT
SYRINGE 10CC LL (SYRINGE) ×4 IMPLANT
TAPE CLOTH SURG 4X10 WHT LF (GAUZE/BANDAGES/DRESSINGS) ×4 IMPLANT
TOWEL OR 17X24 6PK STRL BLUE (TOWEL DISPOSABLE) ×8 IMPLANT
TRAY DSU PREP LF (CUSTOM PROCEDURE TRAY) ×4 IMPLANT
TUBE CONNECTING 12X1/4 (SUCTIONS) ×4 IMPLANT
WATER STERILE IRR 500ML POUR (IV SOLUTION) ×4 IMPLANT
YANKAUER SUCT BULB TIP NO VENT (SUCTIONS) ×4 IMPLANT

## 2011-06-29 NOTE — Anesthesia Postprocedure Evaluation (Signed)
  Anesthesia Post-op Note  Patient: Kathryn [name]  Procedure(s) Performed: Procedure(s) (LRB): HYSTERECTOMY ABDOMINAL (N/A) SALPINGO OOPHERECTOMY (Bilateral) ENDOMETRIAL ABLATION (N/A) EXPLORATORY LAPAROTOMY (N/A)  Patient Location: PACU  Anesthesia Type: General  Level of Consciousness: awake and alert   Airway and Oxygen Therapy: Patient Spontanous Breathing  Post-op Pain: mild  Post-op Assessment: Post-op Vital signs reviewed, Patient's Cardiovascular Status Stable, Respiratory Function Stable, Patent Airway and No signs of Nausea or vomiting  Post-op Vital Signs: stable  Complications: No apparent anesthesia complications

## 2011-06-29 NOTE — Progress Notes (Signed)
Report given to [facility] in [facility]

## 2011-06-29 NOTE — Anesthesia Procedure Notes (Signed)
Procedure Name: Intubation Date/Time: 06/29/2011 7:39 AM Performed by: Norva Pavlov Pre-anesthesia Checklist: Patient identified, Emergency Drugs available, Suction available and Patient being monitored Patient Re-evaluated:Patient Re-evaluated prior to inductionOxygen Delivery Method: Circle System Utilized Preoxygenation: Pre-oxygenation with 100% oxygen Intubation Type: IV induction Ventilation: Mask ventilation without difficulty Laryngoscope Size: Mac and 3 Grade View: Grade I Tube type: Oral Tube size: 7.0 mm Number of attempts: 1 Airway Equipment and Method: stylet and oral airway Placement Confirmation: ETT inserted through vocal cords under direct vision,  positive ETCO2 and breath sounds checked- equal and bilateral Secured at: 21 cm Tube secured with: Tape Dental Injury: Teeth and Oropharynx as per pre-operative assessment

## 2011-06-29 NOTE — Transfer of Care (Signed)
Immediate Anesthesia Transfer of Care Note  Patient: Deandra A Kovacevic  Procedure(s) Performed: Procedure(s) (LRB): HYSTERECTOMY ABDOMINAL (N/A) SALPINGO OOPHERECTOMY (Bilateral) ENDOMETRIAL ABLATION (N/A) EXPLORATORY LAPAROTOMY (N/A)  Patient Location: PACU  Anesthesia Type: General  Level of Consciousness: drowsy, arouses to name, follows commands  Airway & Oxygen Therapy: Patient Spontanous Breathing and Patient connected to face mask oxygen  Post-op Assessment: Report given to PACU RN and Post -op Vital signs reviewed and stable  Post vital signs: Reviewed and stable  Complications: No apparent anesthesia complications

## 2011-06-29 NOTE — Anesthesia Preprocedure Evaluation (Addendum)
Anesthesia Evaluation  Patient identified by MRN, date of birth, ID band Patient awake    Reviewed: Allergy & Precautions, H&P , NPO status , Patient's Chart, lab work & pertinent test results  Airway Mallampati: II TM Distance: >3 FB Neck ROM: full    Dental No notable dental hx.    Pulmonary neg pulmonary ROS,  breath sounds clear to auscultation  Pulmonary exam normal       Cardiovascular Exercise Tolerance: Good negative cardio ROS  Rhythm:regular Rate:Normal     Neuro/Psych negative neurological ROS  negative psych ROS   GI/Hepatic negative GI ROS, Neg liver ROS,   Endo/Other  negative endocrine ROS  Renal/GU negative Renal ROS  negative genitourinary   Musculoskeletal   Abdominal   Peds  Hematology negative hematology ROS (+)   Anesthesia Other Findings   Reproductive/Obstetrics negative OB ROS                           Anesthesia Physical Anesthesia Plan  ASA: II  Anesthesia Plan: General ETT   Post-op Pain Management:    Induction:   Airway Management Planned:   Additional Equipment:   Intra-op Plan:   Post-operative Plan:   Informed Consent: I have reviewed the patients History and Physical, chart, labs and discussed the procedure including the risks, benefits and alternatives for the proposed anesthesia with the patient or authorized representative who has indicated his/her understanding and acceptance.   Dental Advisory Given  Plan Discussed with: CRNA  Anesthesia Plan Comments:         Anesthesia Quick Evaluation  

## 2011-06-30 NOTE — Discharge Instructions (Signed)
Hysterectomy Care After Refer to this sheet in the next few weeks. These instructions provide you with information on caring for yourself after your procedure. Your caregiver may also give you more specific instructions. Your treatment has been planned according to current medical practices, but problems sometimes occur. Call your caregiver if you have any problems or questions after your procedure. HOME CARE INSTRUCTIONS  Healing will take time. You may have discomfort, tenderness, swelling, and bruising at the surgical site for about 2 weeks. This is normal and will get better as time goes on.  Only take over-the-counter or prescription medicines for pain, discomfort, or fever as directed by your caregiver.   Do not take aspirin. It can cause bleeding.   Do not drive when taking pain medicine.   Follow your caregiver's advice regarding exercise, lifting, driving, and general activities.   Resume your usual diet as directed and allowed.   Get plenty of rest and sleep.   Do not douche, use tampons, or have sexual intercourse for at least 6 weeks or until your caregiver gives you permission.   Change your bandages (dressings) as directed by your caregiver.   Monitor your temperature.   Take showers instead of baths for 2 to 3 weeks.   Do not drink alcohol until your caregiver gives you permission.   If you are constipated, you may take a mild laxative with your caregiver's permission. Bran foods may help with constipation problems. Drinking enough fluids to keep your urine clear or pale yellow may help as well.   Try to have someone home with you for 1 or 2 weeks to help around the house.   Keep all of your follow-up appointments as directed by your caregiver.  SEEK MEDICAL CARE IF:   You have swelling, redness, or increasing pain in the surgical cut (incision) area.   You have pus coming from the incision.   You notice a bad smell coming from the incision or dressing.   You  have swelling, redness, or pain around the intravenous (IV) site.   Your incision breaks open.   You feel dizzy or lightheaded.   You have pain or bleeding when you urinate.   You have persistent diarrhea.   You have persistent nausea and vomiting.   You have abnormal vaginal discharge.   You have a rash.   You have any type of abnormal reaction or develop an allergy to your medicine.   Your pain is not controlled with your prescribed medicine.  SEEK IMMEDIATE MEDICAL CARE IF:   You have a fever.   You have severe abdominal pain.   You have chest pain.   You have shortness of breath.   You faint.   You have pain, swelling, or redness of your leg.   You have heavy vaginal bleeding with blood clots.  MAKE SURE YOU:  Understand these instructions.   Will watch your condition.   Will get help right away if you are not doing well or get worse.  Document Released: 07/18/2004 Document Revised: 12/18/2010 Document Reviewed: 08/15/2010 [employer] Patient Information 2012 [employer]. 

## 2011-06-30 NOTE — Op Note (Signed)
Kathryn [name], Kathryn [name]               ACCOUNT NO.:  [id number]  MEDICAL RECORD NO.:  [id number]  LOCATION:                               FACILITY:  [facility]  PHYSICIAN:  Gretta Cool, M.D. DATE OF BIRTH:  Dec 17, 1960  DATE OF PROCEDURE:  06/28/2011 DATE OF DISCHARGE:                              OPERATIVE REPORT   PREOPERATIVE DIAGNOSES: 1. Bilateral ovarian endometriomas with intermittent leakage and acute     abdominal pain. 2. Submucous and mural fibroids.  POSTOPERATIVE DIAGNOSES: 1. Bilateral ovarian endometriomas with intermittent leakage and acute     abdominal pain. 2. Submucous and mural fibroids. 3. Extreme adhesions of the bilateral endometriomas to the     rectosigmoid and the posterior aspect of the uterus.  PROCEDURE:  Exploratory laparotomy, lysis of extensive adhesions, supracervical hysterectomy and bilateral salpingo-oophorectomy.  SURGEON:  Gretta Cool, M.D.  ANESTHESIA:  General orotracheal.  ASSISTANT:  Dr. Ileana Roup.  PROCEDURE:  Under excellent general anesthesia, with the patient prepped and draped in supine position, with her bladder drained by Foley catheter, a Pfannenstiel incision was made and extended through the fascia.  The rectus muscles were then separated in the midline and the peritoneum opened.  The abdomen was then explored.  Immediately there was identified a chocolate endometriosis material aspirated throughout the pelvis.  Both ovaries were large endometriomas densely adherent to the rectosigmoid and the posterior aspect of the uterus and the cul-de- sac peritoneum.  The upper abdomen appeared entirely unremarkable.  All of the endometriosis seemed confined to the pelvis and the depths of the pelvis in particular.  The ovaries were mobilized from the cul-de-sac and adhesions to the posterior aspect of the uterus and rectosigmoid by blunt and sharp dissection.  Both ovaries looked endometrioma fluid and collapsed during the  dissection.  There was no evidence of anaplastic process, pelvic washings were taken at the beginning of the procedure, but discarded because of the benign appearance of the process.  Once the adnexal structures were adequately mobilized, clamps were placed across the adnexal pedicles, round ligaments ligated, transected and then the anterior leaf of the broad ligament dissected off the lower uterine segment.  The peritoneum was opened and the ureter was identified bilaterally.  The pedicles were then clamped, cut, sutured and tied with 0 Vicryl.  A second free tie was used to doubly ligate the pedicle.  At this point, the uterine vessels were skeletonized, clamped, cut, sutured and tied with 0 Vicryl.  The upper portion of the cardinal ligaments were then also clamped, cut, sutured and tied.  Because of concern about the patient's support with removal of the cervix, decision was made to leave the cervix in place.  The in conical inverted T incision was made so as to remove most of the endocervical canal.  The remainder of the canal was then cauterized.  The cervix was then closed anterior to posterior with a running suture of 0 Vicryl.  At this point, the cardinal uterosacral complex was secured to the cervix on each side. The cervix felt quite well supported at this point.  The pelvis was irrigated to remove all debris.  All visible evidence of  endometriosis was excised, bleeding points treated by cautery.  At the end of the procedure there was no significant bleeding.  The pelvic floor was re- peritonealized with running suture of number 2-0 Monocryl.  At this point, the packs and retractors were removed.  The pelvis was again irrigated, bile pool placed down in the pelvis and the omentum pulled down as well.  The abdominal peritoneum was then closed with a running suture of 0 Monocryl.  The fascia was then approximated from each angle to the midline with a running suture of 0 Vicryl.   At this point, the subcutaneous tissue was approximated with interrupted sutures of 3-0 Vicryl.  Skin was closed with skin staples and Steri-Strips.  At the end the procedure, sponge and lap counts were correct.  No complications. The patient returned to the recovery room in excellent condition.          ______________________________ Gretta Cool, M.D.     CWL/MEDQ  D:  06/29/2011  T:  06/29/2011  Job:  [id number]  cc:   Dr. Ileana Roup  [facility]  [facility]

## 2011-07-03 ENCOUNTER — Encounter (HOSPITAL_BASED_OUTPATIENT_CLINIC_OR_DEPARTMENT_OTHER): Payer: Self-pay | Admitting: Gynecology

## 2012-06-07 ENCOUNTER — Telehealth: Payer: Self-pay | Admitting: Neurology

## 2012-06-07 NOTE — Telephone Encounter (Signed)
Kathryn [name], Please let her know, she does not have to do research study, but she needs to come back for follow up before we refill or increase clonazepam. Give her a follow up appt

## 2012-06-07 NOTE — Telephone Encounter (Signed)
Patient requesting dose increase on Klonopin.  I am unable to change doses, forwarding request to Provider.  As well, patient has not been seen in over one year, will need to schedule annual appt.  I called and spoke to patient.  She said she would like a new Rx for increased dose, if MD feels that is okay.  Said if we can send one Rx she will call back and schedule appt.  Says she is not interested in doing the research study and would like a different option if possible.

## 2012-06-08 ENCOUNTER — Encounter: Payer: Self-pay | Admitting: *Deleted

## 2012-06-08 ENCOUNTER — Telehealth: Payer: Self-pay | Admitting: Neurology

## 2012-06-09 NOTE — Telephone Encounter (Signed)
Patient is sch. For apt. With Dr.Yan 06/10/2012 spoke with patient

## 2012-06-09 NOTE — Telephone Encounter (Signed)
Patient is sch. For apt 06/10/2012 with Dr.Yan spoke to patient she wants to come.

## 2012-06-09 NOTE — Telephone Encounter (Signed)
Patient calling back because she has no more meds. Please call the patient per Dr. Terrace Arabia to schedule so that she can get a refill after being seen.

## 2012-06-10 ENCOUNTER — Encounter: Payer: Self-pay | Admitting: Neurology

## 2012-06-10 ENCOUNTER — Ambulatory Visit (INDEPENDENT_AMBULATORY_CARE_PROVIDER_SITE_OTHER): Payer: 59 | Admitting: Neurology

## 2012-06-10 VITALS — BP 131/75 | HR 68 | Temp 97.1°F | Ht 62.0 in | Wt 146.0 lb

## 2012-06-10 DIAGNOSIS — G243 Spasmodic torticollis: Secondary | ICD-10-CM | POA: Insufficient documentation

## 2012-06-10 MED ORDER — VENLAFAXINE HCL ER 37.5 MG PO CP24: 75.0000 mg | ORAL_CAPSULE | Freq: Every day | ORAL | Status: DC

## 2012-06-10 MED ORDER — CLONAZEPAM 0.5 MG PO TABS: 0.5000 mg | ORAL_TABLET | Freq: Two times a day (BID) | ORAL | Status: DC | PRN
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
## 2012-06-14 ENCOUNTER — Encounter: Payer: Self-pay | Admitting: Neurology

## 2013-03-07 ENCOUNTER — Other Ambulatory Visit: Payer: Self-pay | Admitting: Neurology

## 2013-03-07 MED ORDER — CLONAZEPAM 0.5 MG PO TABS: 0.5000 mg | ORAL_TABLET | Freq: Two times a day (BID) | ORAL | Status: DC | PRN

## 2013-03-08 NOTE — Telephone Encounter (Signed)
Rx has been faxed.

## 2013-03-10 ENCOUNTER — Telehealth: Payer: Self-pay | Admitting: Neurology
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
## 2013-03-10 NOTE — Telephone Encounter (Signed)
Patient calling for clonazepam refill. She uses [facility] on [address]. [phone].

## 2013-07-12 ENCOUNTER — Ambulatory Visit: Payer: 59 | Admitting: Neurology

## 2013-09-13 ENCOUNTER — Telehealth: Payer: Self-pay | Admitting: Neurology

## 2013-09-13 NOTE — Telephone Encounter (Signed)
Patient calling to check why her clonazepam medication refill request was denied, please return call and advise.

## 2013-09-13 NOTE — Telephone Encounter (Signed)
Patient has scheduled an appt.  Request forwarded to provider for approval

## 2013-09-13 NOTE — Telephone Encounter (Signed)
We have not received a refill request from the pharmacy.  By viewing the chart, this patient has not been seen in over 1 year.  I called the patient back, got no answer.  Left message.

## 2013-09-14 ENCOUNTER — Ambulatory Visit (INDEPENDENT_AMBULATORY_CARE_PROVIDER_SITE_OTHER): Payer: Self-pay | Admitting: Neurology

## 2013-09-14 ENCOUNTER — Encounter: Payer: Self-pay | Admitting: Neurology

## 2013-09-14 VITALS — BP 139/84 | HR 82 | Ht 62.0 in | Wt 150.0 lb

## 2013-09-14 DIAGNOSIS — G243 Spasmodic torticollis: Secondary | ICD-10-CM

## 2013-09-14 MED ORDER — CLONAZEPAM 0.5 MG PO TABS: 0.5000 mg | ORAL_TABLET | Freq: Three times a day (TID) | ORAL | Status: DC
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
## 2013-11-13 ENCOUNTER — Encounter: Payer: Self-pay | Admitting: Neurology

## 2014-04-02 ENCOUNTER — Telehealth: Payer: Self-pay | Admitting: Neurology

## 2014-04-02 NOTE — Telephone Encounter (Signed)
Patient is calling as she needs refill for Rx Klonopin 0.5 mg 3 tablets per day. Please call.

## 2014-04-03 ENCOUNTER — Other Ambulatory Visit: Payer: Self-pay

## 2014-04-03 MED ORDER — CLONAZEPAM 0.5 MG PO TABS: 0.5000 mg | ORAL_TABLET | Freq: Three times a day (TID) | ORAL | Status: DC

## 2014-04-04 NOTE — Telephone Encounter (Signed)
Rx signed and faxed.

## 2014-07-24 ENCOUNTER — Emergency Department (HOSPITAL_COMMUNITY)
Admission: EM | Admit: 2014-07-24 | Discharge: 2014-07-24 | Disposition: A | Discharge status: Home / Self Care | Payer: Self-pay | Attending: Emergency Medicine | Admitting: Emergency Medicine

## 2014-07-24 ENCOUNTER — Emergency Department (HOSPITAL_COMMUNITY): Payer: Self-pay

## 2014-07-24 ENCOUNTER — Encounter (HOSPITAL_COMMUNITY): Payer: Self-pay | Admitting: Emergency Medicine

## 2014-07-24 DIAGNOSIS — R000 Tachycardia, unspecified: Secondary | ICD-10-CM | POA: Insufficient documentation

## 2014-07-24 DIAGNOSIS — M542 Cervicalgia: Secondary | ICD-10-CM | POA: Insufficient documentation

## 2014-07-24 DIAGNOSIS — R079 Chest pain, unspecified: Secondary | ICD-10-CM | POA: Insufficient documentation

## 2014-07-24 DIAGNOSIS — F418 Other specified anxiety disorders: Secondary | ICD-10-CM | POA: Insufficient documentation

## 2014-07-24 DIAGNOSIS — Z8742 Personal history of other diseases of the female genital tract: Secondary | ICD-10-CM | POA: Insufficient documentation

## 2014-07-24 DIAGNOSIS — M549 Dorsalgia, unspecified: Secondary | ICD-10-CM | POA: Insufficient documentation

## 2014-07-24 DIAGNOSIS — Z79899 Other long term (current) drug therapy: Secondary | ICD-10-CM | POA: Insufficient documentation

## 2014-07-24 LAB — COMPREHENSIVE METABOLIC PANEL
ALK PHOS: 82 U/L (ref 38–126)
ALT: 24 U/L (ref 14–54)
AST: 26 U/L (ref 15–41)
Albumin: 4.3 g/dL (ref 3.5–5.0)
Anion gap: 11 (ref 5–15)
BUN: 10 mg/dL (ref 6–20)
CALCIUM: 9.1 mg/dL (ref 8.9–10.3)
CHLORIDE: 102 mmol/L (ref 101–111)
CO2: 27 mmol/L (ref 22–32)
Creatinine, Ser: 0.85 mg/dL (ref 0.44–1.00)
GFR calc Af Amer: >60 mL/min (ref >60)
Glucose, Bld: 99 mg/dL (ref 65–99)
Potassium: 3.7 mmol/L (ref 3.5–5.1)
SODIUM: 140 mmol/L (ref 135–145)
Total Bilirubin: 0.6 mg/dL (ref 0.3–1.2)
Total Protein: 7.6 g/dL (ref 6.5–8.1)

## 2014-07-24 LAB — CBC WITH DIFFERENTIAL/PLATELET
BASOS PCT: 0 % (ref 0–1)
Basophils Absolute: 0 10*3/uL (ref 0.0–0.1)
Eosinophils Absolute: 0.1 10*3/uL (ref 0.0–0.7)
Eosinophils Relative: 1 % (ref 0–5)
HEMATOCRIT: 48.1 % — AB (ref 36.0–46.0)
Hemoglobin: 16 g/dL — ABNORMAL HIGH (ref 12.0–15.0)
LYMPHS PCT: 13 % (ref 12–46)
Lymphs Abs: 1.4 10*3/uL (ref 0.7–4.0)
MCH: 30.2 pg (ref 26.0–34.0)
MCHC: 33.3 g/dL (ref 30.0–36.0)
MCV: 90.9 fL (ref 78.0–100.0)
MONOS PCT: 7 % (ref 3–12)
Monocytes Absolute: 0.8 10*3/uL (ref 0.1–1.0)
NEUTROS PCT: 79 % — AB (ref 43–77)
Neutro Abs: 8.5 10*3/uL — ABNORMAL HIGH (ref 1.7–7.7)
Platelets: 223 10*3/uL (ref 150–400)
RBC: 5.29 MIL/uL — ABNORMAL HIGH (ref 3.87–5.11)
RDW: 13.3 % (ref 11.5–15.5)
WBC: 10.8 10*3/uL — ABNORMAL HIGH (ref 4.0–10.5)

## 2014-07-24 LAB — I-STAT TROPONIN, ED
TROPONIN I, POC: 0 ng/mL (ref 0.00–0.08)
TROPONIN I, POC: 0 ng/mL (ref 0.00–0.08)

## 2014-07-24 LAB — D-DIMER, QUANTITATIVE: D-Dimer, Quant: 0.46 ug/mL-FEU (ref 0.00–0.48)

## 2014-07-24 LAB — LIPASE, BLOOD: Lipase: 20 U/L — ABNORMAL LOW (ref 22–51)

## 2014-07-24 MED ORDER — HYDROCODONE-ACETAMINOPHEN 5-325 MG PO TABS: 1.0000 | ORAL_TABLET | Freq: Four times a day (QID) | ORAL | Status: DC | PRN

## 2014-07-24 NOTE — ED Notes (Signed)
Pt states that she has been having episodes of intermittent chest pain radiating to back and up to her jaw.  States that she has had one every day for the past 3 days.

## 2014-07-24 NOTE — ED Provider Notes (Signed)
CSN: [id number]     Arrival date & time 07/24/14  1254 History   First MD Initiated Contact with Patient 07/24/14 1309     Chief Complaint  Patient presents with  . Chest Pain  . Back Pain  . Neck Pain    Patient is a 54 y.o. female presenting with chest pain, back pain, and neck pain. The history is provided by the patient. No language interpreter was used.  Chest Pain Associated symptoms: back pain   Back Pain Associated symptoms: chest pain   Neck Pain Associated symptoms: chest pain    [name] presents for reevaluation of chest pain. She reports 2-3 weeks of intermittent right-sided chest pain and central back pain. Pain episodes are waxing and waning. The pain is more frequent at night and in the morning. When she does have a painful episode lasted about 10 minutes and is worse with movement. At times the pain radiates up into the neck and the right jaw. She denies any fevers, shortness of breath, leg swelling. She's had decreased appetite and nausea, no abdominal pain or vomiting. She has a history of hysterectomy. Symptoms are moderate, waxing and waning, worsening. She has no current pain on evaluation in the emergency department.  Past Medical History  Diagnosis Date  . Anxiety   . Mass of pelvis   . Endometriosis   . Endometrioma   . Torticollis, unspecified NECK MUSCLE--  OCCASIONAL  . Depression with anxiety    Past Surgical History  Procedure Laterality Date  . Excision of a hidradenitis abscess, left inguinal area  03-30-2005  . Benign breast cyst removed  YRS AGO  . Abdominal hysterectomy  06/29/2011    Procedure: HYSTERECTOMY ABDOMINAL;  Surgeon: Selinda Orion, MD;  Location: [facility];  Service: Gynecology;  Laterality: N/A;  PROCEDURE READS: EXPLORATORY LAPAROTOMY, TOTAL ABDOMINAL HYSTERECTOMY WITH BSO AND  SUPERCERVICAL HYSTERECTOMY WITH BSO AND EXCISION OF ENDOMETRIOMIAS OWER  . Salpingoophorectomy  06/29/2011    Procedure: SALPINGO  OOPHERECTOMY;  Surgeon: Selinda Orion, MD;  Location: [facility];  Service: Gynecology;  Laterality: Bilateral;  . Endometrial ablation  06/29/2011    Procedure: ENDOMETRIAL ABLATION;  Surgeon: Selinda Orion, MD;  Location: [facility];  Service: Gynecology;  Laterality: N/A;  . Laparotomy  06/29/2011    Procedure: EXPLORATORY LAPAROTOMY;  Surgeon: Selinda Orion, MD;  Location: [facility];  Service: Gynecology;  Laterality: N/A;   Family History  Problem Relation Age of Onset  . Anesthesia problems Neg Hx   . Cancer Father    History  Substance Use Topics  . Smoking status: Never Smoker   . Smokeless tobacco: Never Used  . Alcohol Use: Yes     Comment: 2-3 Alcoholic beverages daily.   OB History    Gravida Para Term Preterm AB TAB SAB Ectopic Multiple Living   3 2 2  0 1 0 1 0 0 2     Review of Systems  Cardiovascular: Positive for chest pain.  Musculoskeletal: Positive for back pain and neck pain.  All other systems reviewed and are negative.     Allergies  Review of patient's allergies indicates no known allergies.  Home Medications   Prior to Admission medications   Medication Sig Start Date End Date Taking? Authorizing Provider  cholecalciferol (VITAMIN D) 1000 UNITS tablet Take 1,000 Units by mouth daily.   Yes Historical Provider, MD  clonazePAM (KLONOPIN) 0.5 MG tablet Take 1 tablet (0.5  mg total) by mouth 3 (three) times daily. Patient taking differently: Take 0.5 mg by mouth 3 (three) times daily as needed for anxiety.  04/03/14  Yes Marcial Pacas, MD  cyclobenzaprine (FLEXERIL) 10 MG tablet Take 10 mg by mouth 3 (three) times daily as needed for muscle spasms.   Yes Historical Provider, MD  Flaxseed, Linseed, (FLAX SEEDS PO) Take 1 tablet by mouth daily.   Yes Historical Provider, MD  Misc Natural Products (ACAI+SUPERFRUIT/GREEN TEA) TABS Take 1 tablet by mouth daily.   Yes Historical Provider, MD  Multiple Vitamin  (MULITIVITAMIN WITH MINERALS) TABS Take 1 tablet by mouth daily.   Yes Historical Provider, MD  venlafaxine XR (EFFEXOR-XR) 37.5 MG 24 hr capsule Take 2 capsules (75 mg total) by mouth daily. One po qday xone week. Patient not taking: Reported on 07/24/2014 06/10/12   Marcial Pacas, MD   BP 160/93 mmHg  Pulse 121  Temp(Src) 98.3 F (36.8 C) (Oral)  Resp 18  SpO2 100% Physical Exam  Constitutional: She is oriented to person, place, and time. She appears well-developed and well-nourished.  HENT:  Head: Normocephalic and atraumatic.  Cardiovascular: Regular rhythm.   No murmur heard. Tachycardic  Pulmonary/Chest: Effort normal and breath sounds normal. No respiratory distress.  Abdominal: Soft. There is no tenderness. There is no rebound and no guarding.  Musculoskeletal: She exhibits no edema or tenderness.  Neurological: She is alert and oriented to person, place, and time.  Skin: Skin is warm and dry.  Psychiatric: She has a normal mood and affect. Her behavior is normal.  Nursing note and vitals reviewed.   ED Course  Procedures (including critical care time) Labs Review Labs Reviewed  CBC WITH DIFFERENTIAL/PLATELET - Abnormal; Notable for the following:    WBC 10.8 (*)    RBC 5.29 (*)    Hemoglobin 16.0 (*)    HCT 48.1 (*)    Neutrophils Relative % 79 (*)    Neutro Abs 8.5 (*)    All other components within normal limits  LIPASE, BLOOD - Abnormal; Notable for the following:    Lipase 20 (*)    All other components within normal limits  COMPREHENSIVE METABOLIC PANEL  D-DIMER, QUANTITATIVE (NOT AT [facility])  Randolm Idol, ED  Randolm Idol, ED    Imaging Review Dg Chest 2 View  07/24/2014   CLINICAL DATA:  Chest pain since this morning.  EXAM: CHEST  2 VIEW  COMPARISON:  None.  FINDINGS: The heart size and mediastinal contours are within normal limits. Both lungs are clear. The visualized skeletal structures are unremarkable.  IMPRESSION: Normal chest x-ray.    Electronically Signed   By: Marijo Sanes M.D.   On: 07/24/2014 14:30     EKG Interpretation   Date/Time:  Tuesday July 24 2014 13:07:05 EDT Ventricular Rate:  117 PR Interval:  111 QRS Duration: 69 QT Interval:  317 QTC Calculation: 442 R Axis:   1 Text Interpretation:  Sinus tachycardia no acute ST changes Confirmed by  Hazle Coca [phone]) on 07/24/2014 1:59:43 PM      MDM   Final diagnoses:  Chest pain, unspecified chest pain type    Pt here for evaluation of intermittent right sided chest pain.  Hx and presentation is atypical for ACS, CHF, dissection, biliary colic.  Chest pain happens at night and when lying on her side.  No active pain in department. No abdominal tenderness/chest wall tenderness on exam.  Discussed with pt outpatient follow up, return precautions.  Quintella Reichert, MD 07/25/14 [phone]

## 2014-07-24 NOTE — ED Notes (Signed)
Patient wanted to know if she was able to eat. Per Dr. Ayesha Rumpf, patient needs to wait until test results comes back in.

## 2014-07-24 NOTE — Discharge Instructions (Signed)

## 2014-07-24 NOTE — Care Management Note (Signed)
Case Management Note  Patient Details  Name: Kathryn [name] MRN: [id number] Date of Birth: 10-02-1960  Subjective/Objective:      54 y.o. F seen in the ED for intermittent CP x 3d. States she has no insurance and confirms self-pay d/t decreased work days to 2-3 per week. Appreciative of Self pay resources and willing to accept referral to [facility]. Unaware that she had appt upcoming with Neurologist that she had last seen 10/2013. She states that she did see Neurologist Krista Blue  for a reduced rate d/t her lack of insurance.               Action/Plan:  CM discussed and provided written information for self pay pcps, discussed the importance of pcp vs EDP services for f/u care, www.needymeds.org, www.goodrx.com, discounted pharmacies and other [employer] such as [employer] [facility] [employer], affordable care act,  [place] med assist, financial assistance, self pay dental services, [place] med assist, DSS and  health department  Reviewed resources for [employer] self pay pcps like [facility], family medicine at [employer], [facility], [facility], local urgent care centers, [facility], [facility] family practice, general medical clinics, family services of the [place], [facility] urgent care plus others, medication resources, [facility] out [facility]ies  Pt voiced understanding and appreciation of resources provided   Provided [facility] contact information Pt agreed to a referral Cm completed referral Pt to be contact by [facility] clinical liaison    Expected Discharge Date:                  Expected Discharge Plan:     In-House Referral:     Discharge planning Services     Post Acute Care Choice:    Choice offered to:     DME Arranged:    DME Agency:     HH Arranged:    [place] Agency:     Status of Service:     Medicare Important Message Given:    Date Medicare IM Given:    Medicare IM give by:    Date Additional Medicare IM Given:    Additional Medicare Important Message give  by:     If discussed at [place] of Stay Meetings, dates discussed:    Additional Comments:  Delrae Sawyers, RN 07/24/2014, 3:51 PM

## 2014-09-18 ENCOUNTER — Ambulatory Visit (INDEPENDENT_AMBULATORY_CARE_PROVIDER_SITE_OTHER): Payer: Self-pay | Admitting: Nurse Practitioner

## 2014-09-18 ENCOUNTER — Encounter: Payer: Self-pay | Admitting: Nurse Practitioner

## 2014-09-18 VITALS — BP 120/88 | HR 108 | Ht 62.0 in | Wt 145.8 lb

## 2014-09-18 DIAGNOSIS — G243 Spasmodic torticollis: Secondary | ICD-10-CM

## 2014-09-18 MED ORDER — CLONAZEPAM 0.5 MG PO TABS: 0.5000 mg | ORAL_TABLET | Freq: Three times a day (TID) | ORAL | Status: DC | PRN

## 2014-09-18 NOTE — Patient Instructions (Signed)
Continue Klonipin at current dose will refill
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
## 2014-09-19 NOTE — Progress Notes (Signed)
I have reviewed and agreed above plan. 

## 2015-06-04 ENCOUNTER — Telehealth: Payer: Self-pay | Admitting: Neurology

## 2015-06-04 MED ORDER — CLONAZEPAM 0.5 MG PO TABS: 0.5000 mg | ORAL_TABLET | Freq: Three times a day (TID) | ORAL | Status: DC | PRN

## 2015-06-04 NOTE — Telephone Encounter (Signed)
Message For: OFFICE               Taken 23-MAY-17 at 12:41PM by MCP ------------------------------------------------------------ Kathryn [name]                  CID [id number]  Patient SAME                 Pt's Dr Krista Blue          Area Code 336 Phone# [phone]    [facility], HAS ONLY 2        PILLS LEFT, PLEASE CALL                              Disp:Y/N N If Y = C/B If No Response In 63minutes ============================================================

## 2015-06-04 NOTE — Telephone Encounter (Signed)
Patient requesting refill of clonazePAM (KLONOPIN) 0.5 MG tablet [facility]acy: [facility] Patient states pharmacy was told, patient was never a patient of Dr. Rhea Belton.

## 2015-06-04 NOTE — Telephone Encounter (Signed)
Clonazepam was faxed to the pharmacy earlier today.  Called patient to let her know the status.

## 2015-09-18 ENCOUNTER — Ambulatory Visit: Payer: Self-pay | Admitting: Nurse Practitioner

## 2015-10-25 ENCOUNTER — Ambulatory Visit (INDEPENDENT_AMBULATORY_CARE_PROVIDER_SITE_OTHER): Payer: Self-pay | Admitting: Nurse Practitioner

## 2015-10-25 ENCOUNTER — Encounter: Payer: Self-pay | Admitting: Nurse Practitioner

## 2015-10-25 VITALS — BP 123/85 | HR 107 | Ht 62.0 in | Wt 153.8 lb

## 2015-10-25 DIAGNOSIS — G243 Spasmodic torticollis: Secondary | ICD-10-CM

## 2015-10-25 MED ORDER — CLONAZEPAM 0.5 MG PO TABS: 0.5000 mg | ORAL_TABLET | Freq: Three times a day (TID) | ORAL | 5 refills | Status: DC | PRN

## 2015-10-25 NOTE — Patient Instructions (Signed)
Continue Klonipin at current dose will refill Continue tizanidine. PCP, Follow-up yearly and when necessary Call for worsening of symptoms
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
## 2015-10-25 NOTE — Progress Notes (Signed)
[phone].

## 2015-10-28 NOTE — Progress Notes (Signed)
I have reviewed and agreed above plan. 

## 2016-03-27 ENCOUNTER — Telehealth: Payer: Self-pay | Admitting: Neurology

## 2016-03-27 NOTE — Telephone Encounter (Signed)
Patient had 200 units of xeomin that expired. Office disposed of the medication.

## 2016-06-03 ENCOUNTER — Telehealth: Payer: Self-pay | Admitting: Nurse Practitioner

## 2016-06-03 MED ORDER — CLONAZEPAM 0.5 MG PO TABS: 0.5000 mg | ORAL_TABLET | Freq: Three times a day (TID) | ORAL | 5 refills | Status: DC | PRN

## 2016-06-03 NOTE — Telephone Encounter (Signed)
Pt has called numerous times today saying the pharmacy has sent the refill request several times prior to the message sent earlier. She just called again and said [facility] message from the clinic says patient unknown. She is saying someone has done something incorrectly on our side, said she wants this straightened out so it doesn't happen again. I told her the msg for the request has been forwarded and I would send a msg to RN with her concerns.

## 2016-06-03 NOTE — Telephone Encounter (Signed)
Refilled

## 2016-06-03 NOTE — Telephone Encounter (Signed)
Pt request refill for clonazePAM (KLONOPIN) 0.5 MG tablet [facility]

## 2016-06-03 NOTE — Telephone Encounter (Signed)
There was not a refill request for the klonopin from the pharmacy. [facility] to [phone]. Rx receive and fax.

## 2016-10-23 ENCOUNTER — Ambulatory Visit: Payer: Self-pay | Admitting: Nurse Practitioner

## 2016-10-28 ENCOUNTER — Encounter: Payer: Self-pay | Admitting: Nurse Practitioner

## 2016-10-28 ENCOUNTER — Ambulatory Visit (INDEPENDENT_AMBULATORY_CARE_PROVIDER_SITE_OTHER): Payer: Self-pay | Admitting: Nurse Practitioner

## 2016-10-28 VITALS — BP 148/87 | HR 117 | Ht 62.0 in | Wt 151.8 lb

## 2016-10-28 DIAGNOSIS — G243 Spasmodic torticollis: Secondary | ICD-10-CM

## 2016-10-28 MED ORDER — CLONAZEPAM 0.5 MG PO TABS: 0.5000 mg | ORAL_TABLET | Freq: Three times a day (TID) | ORAL | 5 refills | Status: DC | PRN

## 2016-10-28 NOTE — Progress Notes (Signed)
FX CONFIRMATION RECEIVED CLONAZEPAM [facility] [phone].

## 2016-10-28 NOTE — Patient Instructions (Signed)
Continue Klonipin at current dose will refill Continue tizanidine. Per PCP, Follow-up yearly and when necessary Call for worsening of symptoms
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
## 2016-10-29 NOTE — Progress Notes (Signed)
I have reviewed and agreed above plan. 

## 2016-12-29 ENCOUNTER — Ambulatory Visit (HOSPITAL_COMMUNITY)
Admission: RE | Admit: 2016-12-29 | Discharge: 2016-12-29 | Disposition: A | Discharge status: Home / Self Care | Payer: Self-pay | Source: Ambulatory Visit | Attending: Obstetrics and Gynecology | Admitting: Obstetrics and Gynecology

## 2016-12-29 ENCOUNTER — Other Ambulatory Visit: Payer: Self-pay | Admitting: Radiology

## 2016-12-29 ENCOUNTER — Encounter (HOSPITAL_COMMUNITY): Payer: Self-pay

## 2016-12-29 VITALS — BP 140/98 | Ht 62.0 in | Wt 157.0 lb

## 2016-12-29 DIAGNOSIS — N6310 Unspecified lump in the right breast, unspecified quadrant: Secondary | ICD-10-CM

## 2016-12-29 DIAGNOSIS — Z1239 Encounter for other screening for malignant neoplasm of breast: Secondary | ICD-10-CM

## 2016-12-29 NOTE — Progress Notes (Signed)
Complaints of right breast lump, redness, and pain x 3 months. Patient states the pain has increased and is a constant pain. Patient rates the pain at a 7 out of 10. Patient referred to [facility] by [facility] due to recommending bilateral breast biopsies.  Pap Smear: Pap smear not completed today. Last Pap smear was in 2013 and normal per patient. Per patient has no history of an abnormal Pap smear. Patient has a history of a hysterectomy 06/29/2011 due to endometriosis. Patient no longer needs Pap smears due to her history of a hysterectomy for benign reasons per BCCCP and ACOG guidelines. No Pap smear results but hysterectomy report is in Epic.  Physical exam: Breasts Breasts symmetrical. No skin abnormalities left breast. Right upper inner quadrant of breast red and scar observed right inner breast from history of right breast lump removal. No nipple retraction bilateral breasts. No nipple discharge bilateral breasts. No lymphadenopathy. No lumps palpated left breast. Palpated a firm mass within the right upper inner quadrant. Complaints of diffuse bilateral breast pain on exam that was greater within the left outer breast and right upper inner quadrant. Referred patient to [facility] for bilateral breast biopsies per recommendation. Appointment scheduled for Tuesday, December 29, 2016 at 1400.     Pelvic/Bimanual No Pap smear completed today since patient has a history of a hysterectomy for benign reasons. Pap smear not indicated per [facility]CP guidelines.   Smoking History: Patient has never smoked.  Patient Navigation: Patient education provided. Access to services provided for patient through [facility] program.   Colorectal Cancer Screening: Per patient has never had a colonoscopy completed. No complaints today. FIT Test given to patient to complete and return to [facility]CP.

## 2016-12-29 NOTE — Patient Instructions (Addendum)
Explained breast self awareness with Kathryn [name]. Patient did not need a Pap smear today due to patient has a history of a hysterectomy for benign reasons. Let patient know that she doesn't need any further Pap smears due to her history of a hysterectomy for benign reasons. Referred patient to [facility] for bilateral breast biopsies per recommendation. Appointment scheduled for Tuesday, December 29, 2016 at 1400. Patient aware of appointment and will be there. Kathryn [name] verbalized understanding.  Kathryn [name], Arvil Chaco, RN 12:40 PM

## 2016-12-30 ENCOUNTER — Encounter (HOSPITAL_COMMUNITY): Payer: Self-pay | Admitting: *Deleted

## 2016-12-31 ENCOUNTER — Telehealth: Payer: Self-pay | Admitting: Hematology and Oncology

## 2016-12-31 NOTE — Telephone Encounter (Signed)
LVM for patient to return call in reference to [facility] on 01/13/17

## 2017-01-01 ENCOUNTER — Encounter: Payer: Self-pay | Admitting: *Deleted

## 2017-01-04 ENCOUNTER — Other Ambulatory Visit: Payer: Self-pay | Admitting: *Deleted

## 2017-01-04 DIAGNOSIS — C50411 Malignant neoplasm of upper-outer quadrant of right female breast: Secondary | ICD-10-CM | POA: Insufficient documentation

## 2017-01-04 DIAGNOSIS — Z171 Estrogen receptor negative status [ER-]: Principal | ICD-10-CM

## 2017-01-12 DIAGNOSIS — C50919 Malignant neoplasm of unspecified site of unspecified female breast: Secondary | ICD-10-CM

## 2017-01-12 HISTORY — DX: Malignant neoplasm of unspecified site of unspecified female breast: C50.919

## 2017-01-13 ENCOUNTER — Other Ambulatory Visit: Payer: Self-pay | Admitting: *Deleted

## 2017-01-13 ENCOUNTER — Other Ambulatory Visit: Payer: Self-pay | Admitting: Hematology and Oncology

## 2017-01-13 ENCOUNTER — Ambulatory Visit
Admission: RE | Admit: 2017-01-13 | Discharge: 2017-01-13 | Disposition: A | Discharge status: Home / Self Care | Payer: Medicaid Other | Source: Ambulatory Visit | Attending: Radiation Oncology | Admitting: Radiation Oncology

## 2017-01-13 ENCOUNTER — Ambulatory Visit (HOSPITAL_BASED_OUTPATIENT_CLINIC_OR_DEPARTMENT_OTHER): Payer: Medicaid Other | Admitting: Hematology and Oncology

## 2017-01-13 ENCOUNTER — Encounter: Payer: Self-pay | Admitting: Radiation Oncology

## 2017-01-13 ENCOUNTER — Encounter: Payer: Self-pay | Admitting: Hematology and Oncology

## 2017-01-13 ENCOUNTER — Encounter: Payer: Self-pay | Admitting: *Deleted

## 2017-01-13 ENCOUNTER — Telehealth: Payer: Self-pay | Admitting: Hematology and Oncology

## 2017-01-13 ENCOUNTER — Ambulatory Visit: Payer: Medicaid Other | Attending: General Surgery | Admitting: Physical Therapy

## 2017-01-13 ENCOUNTER — Other Ambulatory Visit (HOSPITAL_BASED_OUTPATIENT_CLINIC_OR_DEPARTMENT_OTHER): Payer: Medicaid Other

## 2017-01-13 ENCOUNTER — Other Ambulatory Visit: Payer: Self-pay | Admitting: General Surgery

## 2017-01-13 VITALS — BP 127/85 | HR 111 | Temp 97.5°F | Resp 19 | Ht 62.0 in | Wt 158.2 lb

## 2017-01-13 DIAGNOSIS — Z006 Encounter for examination for normal comparison and control in clinical research program: Secondary | ICD-10-CM | POA: Insufficient documentation

## 2017-01-13 DIAGNOSIS — C50411 Malignant neoplasm of upper-outer quadrant of right female breast: Secondary | ICD-10-CM

## 2017-01-13 DIAGNOSIS — R293 Abnormal posture: Secondary | ICD-10-CM | POA: Diagnosis present

## 2017-01-13 DIAGNOSIS — Z171 Estrogen receptor negative status [ER-]: Principal | ICD-10-CM

## 2017-01-13 DIAGNOSIS — K769 Liver disease, unspecified: Secondary | ICD-10-CM | POA: Insufficient documentation

## 2017-01-13 DIAGNOSIS — K7689 Other specified diseases of liver: Secondary | ICD-10-CM | POA: Insufficient documentation

## 2017-01-13 DIAGNOSIS — Z809 Family history of malignant neoplasm, unspecified: Secondary | ICD-10-CM

## 2017-01-13 DIAGNOSIS — C50211 Malignant neoplasm of upper-inner quadrant of right female breast: Secondary | ICD-10-CM | POA: Diagnosis present

## 2017-01-13 DIAGNOSIS — R21 Rash and other nonspecific skin eruption: Secondary | ICD-10-CM | POA: Insufficient documentation

## 2017-01-13 DIAGNOSIS — Z5111 Encounter for antineoplastic chemotherapy: Secondary | ICD-10-CM | POA: Insufficient documentation

## 2017-01-13 DIAGNOSIS — R5383 Other fatigue: Secondary | ICD-10-CM | POA: Insufficient documentation

## 2017-01-13 DIAGNOSIS — Z79899 Other long term (current) drug therapy: Secondary | ICD-10-CM | POA: Insufficient documentation

## 2017-01-13 LAB — COMPREHENSIVE METABOLIC PANEL
ALT: 34 U/L (ref 0–55)
ANION GAP: 9 meq/L (ref 3–11)
AST: 24 U/L (ref 5–34)
Albumin: 3.7 g/dL (ref 3.5–5.0)
Alkaline Phosphatase: 87 U/L (ref 40–150)
BUN: 13.4 mg/dL (ref 7.0–26.0)
CALCIUM: 9.2 mg/dL (ref 8.4–10.4)
CHLORIDE: 104 meq/L (ref 98–109)
CO2: 27 mEq/L (ref 22–29)
CREATININE: 0.9 mg/dL (ref 0.6–1.1)
EGFR: >60 mL/min/{1.73_m2} (ref >60)
Glucose: 110 mg/dl (ref 70–140)
Potassium: 4.1 mEq/L (ref 3.5–5.1)
Sodium: 140 mEq/L (ref 136–145)
Total Bilirubin: 0.39 mg/dL (ref 0.20–1.20)
Total Protein: 6.9 g/dL (ref 6.4–8.3)

## 2017-01-13 LAB — CBC WITH DIFFERENTIAL/PLATELET
BASO%: 0.6 % (ref 0.0–2.0)
Basophils Absolute: 0 10*3/uL (ref 0.0–0.1)
EOS ABS: 0.3 10*3/uL (ref 0.0–0.5)
EOS%: 4.3 % (ref 0.0–7.0)
HEMATOCRIT: 43.9 % (ref 34.8–46.6)
HGB: 14.9 g/dL (ref 11.6–15.9)
LYMPH#: 1.6 10*3/uL (ref 0.9–3.3)
LYMPH%: 22.1 % (ref 14.0–49.7)
MCH: 30.3 pg (ref 25.1–34.0)
MCHC: 34 g/dL (ref 31.5–36.0)
MCV: 89.2 fL (ref 79.5–101.0)
MONO#: 0.4 10*3/uL (ref 0.1–0.9)
MONO%: 6.2 % (ref 0.0–14.0)
NEUT#: 4.8 10*3/uL (ref 1.5–6.5)
NEUT%: 66.8 % (ref 38.4–76.8)
PLATELETS: 268 10*3/uL (ref 145–400)
RBC: 4.92 10*6/uL (ref 3.70–5.45)
RDW: 13.6 % (ref 11.2–14.5)
WBC: 7.2 10*3/uL (ref 3.9–10.3)

## 2017-01-13 MED ORDER — ONDANSETRON HCL 8 MG PO TABS: 8.0000 mg | ORAL_TABLET | Freq: Two times a day (BID) | ORAL | 1 refills | Status: DC | PRN

## 2017-01-13 MED ORDER — PROCHLORPERAZINE MALEATE 10 MG PO TABS: 10.0000 mg | ORAL_TABLET | Freq: Four times a day (QID) | ORAL | 1 refills | Status: DC | PRN

## 2017-01-13 MED ORDER — LORAZEPAM 0.5 MG PO TABS: 0.5000 mg | ORAL_TABLET | Freq: Every evening | ORAL | 0 refills | Status: DC | PRN

## 2017-01-13 MED ORDER — LIDOCAINE-PRILOCAINE 2.5-2.5 % EX CREA: TOPICAL_CREAM | CUTANEOUS | 3 refills | Status: DC

## 2017-01-13 MED ORDER — DEXAMETHASONE 4 MG PO TABS: ORAL_TABLET | ORAL | 0 refills | Status: DC

## 2017-01-13 NOTE — Progress Notes (Signed)
Radiation Oncology         ([phone]) [phone] ________________________________  Name: Kathryn [name]        MRN: [id number]  Date of Service: 01/13/2017 DOB: 08/26/1960  CC:Kathryn Hippo, MD  Kathryn Skates, MD     REFERRING PHYSICIAN: Fanny Skates, MD   DIAGNOSIS: Stage IIIc, cT3N0, grade 3, triple negative invasive ductal carcinoma of the right breast  HISTORY OF PRESENT ILLNESS: Kathryn [name] is a 57 y.o. female seen in the multidisciplinary breast clinic for a new diagnosis of right breast cancer. The patient was noted to have a palpable mass in the right breast and focal asymmetry of the left. She underwent diagnostic imaging with mammography and ultrasound on 12/24/16. She was noted to have a 6 cm mass in the upper inner quadrant of the right breast at 1:00, and multiple cysts within the right breast at 12:00, and at 11:00. These appeared to be complex cystic densities. No adenopathy was noted in either axilla by ultrasound. The left breast revealed a 1.3 cm oval mass at 10:00 and another 7 mm mass in the left breast at 6:00 was also noted.  A biopsy of the large right breast mass at 1:00 revealed a grade 3 invasive ductal carcinoma, triple negative with a Ki 67 of 40%. The 12:00 lesion was also biopsied and was a fibroadenoma. No biopsies were taken of the 11:00 position. She also had a biopsy of the left breast at around 11:00 that was read as fibroadenoma. She comes today to discuss options of her care for her cancer.    PREVIOUS RADIATION THERAPY: No   PAST MEDICAL HISTORY:  Past Medical History:  Diagnosis Date  . Anxiety   . Depression with anxiety   . Endometrioma   . Endometriosis   . Mass of pelvis   . Torticollis, unspecified NECK MUSCLE--  OCCASIONAL       PAST SURGICAL HISTORY: Past Surgical History:  Procedure Laterality Date  . ABDOMINAL HYSTERECTOMY  06/29/2011   Procedure: HYSTERECTOMY ABDOMINAL;  Surgeon: Selinda Orion, MD;  Location: [facility];  Service: Gynecology;  Laterality: N/A;  PROCEDURE READS: EXPLORATORY LAPAROTOMY, TOTAL ABDOMINAL HYSTERECTOMY WITH BSO AND  SUPERCERVICAL HYSTERECTOMY WITH BSO AND EXCISION OF ENDOMETRIOMIAS OWER  . BENIGN BREAST CYST REMOVED  YRS AGO  . ENDOMETRIAL ABLATION  06/29/2011   Procedure: ENDOMETRIAL ABLATION;  Surgeon: Selinda Orion, MD;  Location: [facility];  Service: Gynecology;  Laterality: N/A;  . EXCISION OF A HIDRADENITIS ABSCESS, LEFT INGUINAL AREA  03-30-2005  . LAPAROTOMY  06/29/2011   Procedure: EXPLORATORY LAPAROTOMY;  Surgeon: Selinda Orion, MD;  Location: [facility];  Service: Gynecology;  Laterality: N/A;  . SALPINGOOPHORECTOMY  06/29/2011   Procedure: SALPINGO OOPHERECTOMY;  Surgeon: Selinda Orion, MD;  Location[facility];  Service: Gynecology;  Laterality: Bilateral;     FAMILY HISTORY:  Family History  Problem Relation Age of Onset  . Cancer Father   . Diabetes Father   . Hypertension Father   . Cancer Maternal Aunt   . Heart attack Paternal Aunt   . Breast cancer Paternal Aunt   . Breast cancer Cousin   . Anesthesia problems Neg Hx      SOCIAL HISTORY:  reports that  has never smoked. she has never used smokeless tobacco. She reports that she drinks alcohol. She reports that she does not use drugs. The patient is divorced and lives in [place].  ALLERGIES: Patient has no known allergies.   MEDICATIONS:  Current Outpatient Medications  Medication Sig Dispense Refill  . cholecalciferol (VITAMIN D) 1000 UNITS tablet Take 1,000 Units by mouth daily.    . clonazePAM (KLONOPIN) 0.5 MG tablet Take 1 tablet (0.5 mg total) by mouth 3 (three) times daily as needed for anxiety. 90 tablet 5  . cyclobenzaprine (FLEXERIL) 10 MG tablet Take 10 mg by mouth 3 (three) times daily as needed for muscle spasms.    Marland Kitchen dexamethasone (DECADRON) 4 MG tablet 1 tablet day after chemo and 1 tablet 2 days after chemo with food 8  tablet 0  . lidocaine-prilocaine (EMLA) cream Apply to affected area once 30 g 3  . LORazepam (ATIVAN) 0.5 MG tablet Take 1 tablet (0.5 mg total) by mouth at bedtime as needed (Nausea or vomiting). 30 tablet 0  . Misc Natural Products (ACAI+SUPERFRUIT/GREEN TEA) TABS Take 1 tablet by mouth daily.    . Multiple Vitamin (MULITIVITAMIN WITH MINERALS) TABS Take 1 tablet by mouth daily. Plus vit C '500mg'$     . naproxen (NAPROSYN) 500 MG tablet Take 500 mg by mouth every 12 (twelve) hours as needed.    . ondansetron (ZOFRAN) 8 MG tablet Take 1 tablet (8 mg total) by mouth 2 (two) times daily as needed. Start on the third day after chemotherapy. 30 tablet 1  . prochlorperazine (COMPAZINE) 10 MG tablet Take 1 tablet (10 mg total) by mouth every 6 (six) hours as needed (Nausea or vomiting). 30 tablet 1   No current facility-administered medications for this encounter.      REVIEW OF SYSTEMS: On review of systems, the patient reports that she is doing well overall. She has been shocked by the recommendations at clinic today and is understanbly nervous about treatment. She denies any chest pain, shortness of breath, cough, fevers, chills, night sweats, unintended weight changes. She denies any bowel or bladder disturbances, and denies abdominal pain, nausea or vomiting. She denies any new musculoskeletal or joint aches or pains. A complete review of systems is obtained and is otherwise negative.     PHYSICAL EXAM:  Wt Readings from Last 3 Encounters:  01/13/17 158 lb 3.2 oz (71.8 kg)  12/29/16 157 lb (71.2 kg)  10/28/16 151 lb 12.8 oz (68.9 kg)   Temp Readings from Last 3 Encounters:  01/13/17 (!) 97.5 F (36.4 C) (Oral)  07/24/14 98.3 F (36.8 C) (Oral)  06/10/12 97.1 F (36.2 C) (Oral)   BP Readings from Last 3 Encounters:  01/13/17 127/85  12/29/16 (!) 140/98  10/28/16 (!) 148/87   Pulse Readings from Last 3 Encounters:  01/13/17 (!) 111  10/28/16 (!) 117  10/25/15 (!) 107    In  general this is a well appearing caucasian female in no acute distress. She is alert and oriented x4 and appropriate throughout the examination. HEENT reveals that the patient is normocephalic, atraumatic. EOMs are intact. Skin is intact without any evidence of gross lesions. Cardiovascular exam reveals a regular rate and rhythm, no clicks rubs or murmurs are auscultated. Chest is clear to auscultation bilaterally. Lymphatic assessment is performed and does not reveal any adenopathy in the cervical, supraclavicular, axillary chains. Bilateral breast exam is performed and reveals a prior lumpectomy scar along the left. She has a large palpable mass in the upper inner quadrant right breast measuring approximately 6 x 8 cm. There is erythema of the skin underlying this mass but no obvious skin breakthrough or involvement.  Patient has a scar  in the medial aspect of her right breast from a prior benign biopsy and a second scar in the lower inner quadrant of the right breast,  also from a benign biopsy  Lower extremities are negative for pretibial pitting edema, deep calf tenderness, cyanosis or clubbing.   ECOG = 0  0 - Asymptomatic (Fully active, able to carry on all predisease activities without restriction)  1 - Symptomatic but completely ambulatory (Restricted in physically strenuous activity but ambulatory and able to carry out work of a light or sedentary nature. For example, light housework, office work)  2 - Symptomatic, <50% in bed during the day (Ambulatory and capable of all self care but unable to carry out any work activities. Up and about more than 50% of waking hours)  3 - Symptomatic, >50% in bed, but not bedbound (Capable of only limited self-care, confined to bed or chair 50% or more of waking hours)  4 - Bedbound (Completely disabled. Cannot carry on any self-care. Totally confined to bed or chair)  5 - Death   Eustace Pen MM, Creech RH, Tormey DC, et al. [phone]). "Toxicity and response  criteria of the [facility]". [place] Oncol. 5 (6): 649-55    LABORATORY DATA:  Lab Results  Component Value Date   WBC 7.2 01/13/2017   HGB 14.9 01/13/2017   HCT 43.9 01/13/2017   MCV 89.2 01/13/2017   PLT 268 01/13/2017   Lab Results  Component Value Date   NA 140 01/13/2017   K 4.1 01/13/2017   CL 102 07/24/2014   CO2 27 01/13/2017   Lab Results  Component Value Date   ALT 34 01/13/2017   AST 24 01/13/2017   ALKPHOS 87 01/13/2017   BILITOT 0.39 01/13/2017      RADIOGRAPHY: No results found.     IMPRESSION/PLAN: 1. Stage IIIc, cT3N0, grade 3, triple negative invasive ductal carcinoma of the right breast. I met with the patient and discussed the pathology findings and reviewed the nature of triple negative breast disease. The consensus from the breast conference includes an MRI of the breast to establish extent of disease. Following this, the recommendations in conference were to proceed with a biopsy of the 6 mm lesion in the upper outer quadrant of the breast. Recommendations were to complete a staging work up and to proceed with neoadjuvant chemotherapy followed by breast conservation with lumpectomy with sentinel node evaluation. Her adjuvant course would then be followed by external radiotherapy to the breast. If the patient ends up having a mastectomy I would also recommend radiation therapy given the size of her lesion.  We discussed the risks, benefits, short, and Weier term effects of radiotherapy, and the patient is interested in proceeding at the appropriate interval. I also discussed the delivery and logistics of radiotherapy and I anticipate a course of 6 weeks of treatment. I will see her back about 2 weeks after surgery to move forward with the simulation and planning process and anticipate starting radiotherapy about 4 weeks after surgery.  2. Possible genetic predisposition to malignancy. Given the triple negative nature of her disease,  as well as her family history would indicate a role for her to meet with genetic counseling. She will be set up for formal referral.   -----------------------------------  [name], PhD, MD

## 2017-01-13 NOTE — Therapy (Signed)
[place], [place], [address] Phone: [phone]   Fax:  [phone]  Physical Therapy Evaluation  Patient Details  Name: Kathryn [name] MRN: [id number] Date of Birth: 24-May-1960 Referring Provider: Dr. Fanny Skates   Encounter Date: 01/13/2017  PT End of Session - 01/13/17 1151    Visit Number  1    Number of Visits  1    PT Start Time  1020    PT Stop Time  8299 Also saw pt from [phone] for a total of 28 minutes    PT Time Calculation (min)  17 min    Activity Tolerance  Patient tolerated treatment well    Behavior During Therapy  [facility] for tasks assessed/performed       Past Medical History:  Diagnosis Date  . Anxiety   . Depression with anxiety   . Endometrioma   . Endometriosis   . Mass of pelvis   . Torticollis, unspecified NECK MUSCLE--  OCCASIONAL    Past Surgical History:  Procedure Laterality Date  . ABDOMINAL HYSTERECTOMY  06/29/2011   Procedure: HYSTERECTOMY ABDOMINAL;  Surgeon: Selinda Orion, MD;  Location: [facility];  Service: Gynecology;  Laterality: N/A;  PROCEDURE READS: EXPLORATORY LAPAROTOMY, TOTAL ABDOMINAL HYSTERECTOMY WITH BSO AND  SUPERCERVICAL HYSTERECTOMY WITH BSO AND EXCISION OF ENDOMETRIOMIAS OWER  . BENIGN BREAST CYST REMOVED  YRS AGO  . ENDOMETRIAL ABLATION  06/29/2011   Procedure: ENDOMETRIAL ABLATION;  Surgeon: Selinda Orion, MD;  Location: [facility];  Service: Gynecology;  Laterality: N/A;  . EXCISION OF A HIDRADENITIS ABSCESS, LEFT INGUINAL AREA  03-30-2005  . LAPAROTOMY  06/29/2011   Procedure: EXPLORATORY LAPAROTOMY;  Surgeon: Selinda Orion, MD;  Location: [facility];  Service: Gynecology;  Laterality: N/A;  . SALPINGOOPHORECTOMY  06/29/2011   Procedure: SALPINGO OOPHERECTOMY;  Surgeon: Selinda Orion, MD;  Location: [facility];  Service: Gynecology;  Laterality: Bilateral;    There  were no vitals filed for this visit.   Subjective Assessment - 01/13/17 1138    Subjective  Patient reports she is here today to be seen by her medical team for her newly diagnosed right breast cancer.    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 She has a 6 cm mass in the upper inner quadrant and another area of 6 mm that needs to be biopsied in the upper outer quadrant. Her other medical problems are right neck tightness and anxiety.    Patient Stated Goals  reduce lymphedema risk and learn post op shoulder ROM HEP    Currently in Pain?  No/denies         [facility] PT Assessment - 01/13/17 0001      Assessment   Medical Diagnosis  Right breast cancer    Referring Provider  Dr. Fanny Skates    Onset Date/Surgical Date  12/24/16    Hand Dominance  Right    Prior Therapy  none      Precautions   Precautions  Other (comment)    Precaution Comments  active cancer      Restrictions   Weight Bearing Restrictions  No      Balance Screen   Has the patient fallen in the past 6 months  No    Has the patient had a decrease in activity level because of a fear of falling?   No    Is the  patient reluctant to leave their home because of a fear of falling?   No      Home Environment   Living Environment  Private residence    Living Arrangements  Alone    Available Help at Discharge  Friend(s)      Prior Function   Level of Independence  Independent    Vocation  Unemployed    Leisure  She does not exercise      Cognition   Overall Cognitive Status  Within Functional Limits for tasks assessed      Posture/Postural Control   Posture/Postural Control  Postural limitations    Postural Limitations  Rounded Shoulders;Forward head      ROM / Strength   AROM / PROM / Strength  AROM;Strength      AROM   AROM Assessment Site  Shoulder;Cervical    Right/Left Shoulder  Right;Left    Right Shoulder Extension  60 Degrees    Right Shoulder  Flexion  141 Degrees    Right Shoulder ABduction  164 Degrees    Right Shoulder Internal Rotation  72 Degrees    Right Shoulder External Rotation  90 Degrees    Left Shoulder Extension  60 Degrees    Left Shoulder Flexion  142 Degrees    Left Shoulder ABduction  168 Degrees    Left Shoulder Internal Rotation  67 Degrees    Left Shoulder External Rotation  90 Degrees    Cervical Flexion  WNL    Cervical Extension  WNL    Cervical - Right Side Bend  WNL    Cervical - Left Side Bend  WNL    Cervical - Right Rotation  WNL    Cervical - Left Rotation  WNL      Strength   Overall Strength  Within functional limits for tasks performed        LYMPHEDEMA/ONCOLOGY QUESTIONNAIRE - 01/13/17 1149      Type   Cancer Type  Right breast cancer      Lymphedema Assessments   Lymphedema Assessments  Upper extremities      Right Upper Extremity Lymphedema   10 cm Proximal to Olecranon Process  26.2 cm    Olecranon Process  22.4 cm    10 cm Proximal to Ulnar Styloid Process  20.8 cm    Just Proximal to Ulnar Styloid Process  14.5 cm    Across Hand at [employer]  17.5 cm    At [place] of 2nd Digit  5.8 cm      Left Upper Extremity Lymphedema   10 cm Proximal to Olecranon Process  26.2 cm    Olecranon Process  22.3 cm    10 cm Proximal to Ulnar Styloid Process  18.9 cm    Just Proximal to Ulnar Styloid Process  13.8 cm    Across Hand at [employer]  16.5 cm    At [place] of 2nd Digit  5.5 cm          Objective measurements completed on examination: See above findings.     Patient was instructed today in a home exercise program today for post op shoulder range of motion. These included active assist shoulder flexion in sitting, scapular retraction, wall walking with shoulder abduction, and hands behind head external rotation.  She was encouraged to do these twice a day, holding 3 seconds and repeating 5 times when permitted by her physician.      PT Education - 01/13/17 1150  Education provided  Yes    Education Details  Lymphedema risk reduction and post op shoulder ROM HEP    Person(s) Educated  Patient    Methods  Explanation;Demonstration;Handout    Comprehension  Returned demonstration;Verbalized understanding           Breast Clinic Goals - 01/13/17 1158      Patient will be able to verbalize understanding of pertinent lymphedema risk reduction practices relevant to her diagnosis specifically related to skin care.   Time  1    Period  Days    Status  Achieved      Patient will be able to return demonstrate and/or verbalize understanding of the post-op home exercise program related to regaining shoulder range of motion.   Time  1    Period  Days    Status  Achieved      Patient will be able to verbalize understanding of the importance of attending the postoperative After Breast Cancer Class for further lymphedema risk reduction education and therapeutic exercise.   Time  1    Period  Days    Status  Achieved            Plan - 01/13/17 1152    Clinical Impression Statement  Patient was diagnosed on 12/24/16 with right triple negative breast cancer. She has a 6 cm mass in the upper inner quadrant and another area of 6 mm that needs to be biopsied in the upper outer quadrant. Her other medical problems are right neck tightness and anxiety. Her multidisciplinary medical team met prior to her assessments to determine a recommended treatment plan. She is planning to have an MRI, staging scans, genetic testing, neoadjuvant chemotherapy, a right lumpectomy or mastectomy with sentinel node biopsy, and radiation. She will benefit from post op PT to reduce lymphedema risk and regain shoulder ROM.    History and Personal Factors relevant to plan of care:  lives alone; recent cancer diagnosis; unknown extent of disease    Clinical Presentation  Evolving    Clinical Presentation due to:  Unknown extent of disease    Clinical Decision Making  Moderate     Rehab Potential  Excellent    Clinical Impairments Affecting Rehab Potential  none    PT Frequency  One time visit    PT Treatment/Interventions  Patient/family education;Therapeutic exercise;ADLs/Self Care Home Management    PT Next Visit Plan  Will reassess after surgery to determine needs    PT Home Exercise Plan  Post op shoulder ROM HEP    Consulted and Agree with Plan of Care  Patient;Family member/caregiver    Family Member Consulted  Friend       Patient will benefit from skilled therapeutic intervention in order to improve the following deficits and impairments:  Pain, Postural dysfunction, Impaired UE functional use, Decreased range of motion, Decreased knowledge of precautions  Visit Diagnosis: Malignant neoplasm of upper-inner quadrant of right breast in female, estrogen receptor negative ([place]) - Plan: PT plan of care cert/re-cert  Abnormal posture - Plan: PT plan of care cert/re-cert   Patient will follow up at outpatient cancer rehab 3-4 weeks following surgery.  If the patient requires physical therapy at that time, a specific plan will be dictated and sent to the referring physician for approval. The patient was educated today on appropriate basic range of motion exercises to begin post operatively and the importance of attending the After Breast Cancer class following surgery.  Patient was educated today on lymphedema  risk reduction practices as it pertains to recommendations that will benefit the patient immediately following surgery.  She verbalized good understanding.     Problem List Patient Active Problem List   Diagnosis Date Noted  . Malignant neoplasm of upper-outer quadrant of right breast in female, estrogen receptor negative ([place]) 01/04/2017  . 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

## 2017-01-13 NOTE — Assessment & Plan Note (Signed)
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  T3N0 stage IIIb  Pathology and radiology counseling: Discussed with the patient, the details of pathology including the type of breast cancer,the clinical staging, the significance of ER, PR and HER-2/neu receptors and the implications for treatment. After reviewing the pathology in detail, we proceeded to discuss the different treatment options between surgery, radiation, chemotherapy, antiestrogen therapies.  Recommendation based on multidisciplinary tumor board: 1. Neoadjuvant chemotherapy with Adriamycin and Cytoxan dose dense 4 followed by Abraxane weekly 12 2. Followed by breast conserving surgery with sentinel lymph node study vs targeted axillary dissection 3. Followed by adjuvant radiation therapy  Chemotherapy Counseling: I discussed the risks and benefits of chemotherapy including the risks of nausea/ vomiting, risk of infection from low WBC count, fatigue due to chemo or anemia, bruising or bleeding due to low platelets, mouth sores, loss/ change in taste and decreased appetite. Liver and kidney function will be monitored through out chemotherapy as abnormalities in liver and kidney function may be a side effect of treatment. Cardiac dysfunction due to Adriamycin was discussed in detail. Risk of permanent bone marrow dysfunction due to chemo were also discussed.  Plan: 1. Port placement  2. Echocardiogram 3. Chemotherapy class 4. Breast MRI 5. CT chest abdomen pelvis and bone scan for staging Genetic counseling will also be arranged   Return to clinic in 1 week to start chemotherapy.

## 2017-01-13 NOTE — Progress Notes (Signed)
START ON PATHWAY REGIMEN - Breast     A cycle is every 21 days:     Doxorubicin      Cyclophosphamide      Paclitaxel      Carboplatin   **Always confirm dose/schedule in your pharmacy ordering system**    Patient Characteristics: Preoperative or Nonsurgical Candidate (Clinical Staging), Neoadjuvant Therapy followed by Surgery, Invasive Disease, Chemotherapy, HER2 Negative/Unknown/Equivocal, ER Negative/Unknown, Platinum Therapy Indicated Therapeutic Status: Preoperative or Nonsurgical Candidate (Clinical Staging) AJCC M Category: cM0 AJCC Grade: G3 Breast Surgical Plan: Neoadjuvant Therapy followed by Surgery ER Status: Negative (-) AJCC 8 Stage Grouping: IIIB HER2 Status: Negative (-) AJCC T Category: cT3 AJCC N Category: cN0 PR Status: Negative (-) Type of Therapy: Platinum Therapy Indicated Intent of Therapy: Curative Intent, Discussed with Patient

## 2017-01-13 NOTE — Patient Instructions (Signed)

## 2017-01-13 NOTE — Progress Notes (Signed)
Clinical Social Work [place] Psychosocial Distress Screening [place]  Patient completed distress screening protocol and scored a 3 on the Psychosocial Distress Thermometer which indicates mild distress. Clinical Social Worker met with patient and patients friend in [facility] to assess for distress and other psychosocial needs. Patient stated she was feeling overwhelmed but felt "better" after meeting with the treatment team and getting more information on her treatment plan. CSW and patient discussed common feeling and emotions when being diagnosed with cancer, and the importance of support during treatment. CSW informed patient of the support team and support services at [facility]. CSW provided contact information and encouraged patient to call with any questions or concerns.  ONCBCN DISTRESS SCREENING 01/13/2017  Screening Type Initial Screening  Distress experienced in past week (1-10) 3  Practical problem type Insurance  Emotional problem type Nervousness/Anxiety;Adjusting to appearance changes  Information Concerns Type Lack of info about diagnosis;Lack of info about treatment;Lack of info about complementary therapy choices  Physical Problem type Pain  Physician notified of physical symptoms Yes     [name], MSW, LCSW, OSW-C Clinical Social Worker [place] [phone]

## 2017-01-13 NOTE — Progress Notes (Signed)
[place] CONSULT NOTE  Patient Care Team: [name], MD as PCP - General (Family Medicine)  CHIEF COMPLAINTS/PURPOSE OF CONSULTATION:  Newly diagnosed breast cancer  HISTORY OF PRESENTING ILLNESS:  Kathryn [name] 57 y.o. female is here because of recent diagnosis of right breast cancer.  Patient had a palpable lump in the right breast at least for the past 6 months.  She initially thought this was mastitis and that it was going to go away.  She did not pursue management at that time because she did not have insurance.  She thought this nodule can go away spontaneously.  In December she brought it to the attention of her primary care physician who obtained mammograms and ultrasounds.  Mammograms revealed a large 6 cm mass in addition to this there were couple of other nodules including a 1.6 cm lesion at 12 o'clock position which was fibroadenoma and a 0.6 cm lesion at 11:00 which was not biopsied.  I reviewed her records extensively and collaborated the history with the patient.  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  T3N0 stage IIIb      MEDICAL HISTORY:  Past Medical History:  Diagnosis Date  . Anxiety   . Depression with anxiety   . Endometrioma   . Endometriosis   . Mass of pelvis   . Torticollis, unspecified NECK MUSCLE--  OCCASIONAL    SURGICAL HISTORY: Past Surgical History:  Procedure Laterality Date  . ABDOMINAL HYSTERECTOMY  06/29/2011   Procedure: HYSTERECTOMY ABDOMINAL;  Surgeon: Selinda Orion, MD;  Location: [facility];  Service: Gynecology;  Laterality: N/A;  PROCEDURE READS: EXPLORATORY LAPAROTOMY, TOTAL ABDOMINAL HYSTERECTOMY WITH BSO AND  SUPERCERVICAL HYSTERECTOMY WITH BSO AND EXCISION OF ENDOMETRIOMIAS OWER  . BENIGN BREAST CYST REMOVED  YRS AGO  . ENDOMETRIAL ABLATION  06/29/2011   Procedure: ENDOMETRIAL ABLATION;  Surgeon: Selinda Orion, MD;  Location: [facility];  Service: Gynecology;  Laterality: N/A;  . EXCISION OF A HIDRADENITIS ABSCESS, LEFT INGUINAL AREA  03-30-2005  . LAPAROTOMY  06/29/2011   Procedure: EXPLORATORY LAPAROTOMY;  Surgeon: Selinda Orion, MD;  Location: [facility];  Service: Gynecology;  Laterality: N/A;  . SALPINGOOPHORECTOMY  06/29/2011   Procedure: SALPINGO OOPHERECTOMY;  Surgeon: Selinda Orion, MD;  Location: [facility];  Service: Gynecology;  Laterality: Bilateral;    SOCIAL HISTORY: Social History   Socioeconomic History  . Marital status: Divorced    Spouse name: Not on file  . Number of children: 2  . Years of education: Not on file  . Highest education level: Not on file  Social Needs  . Financial resource strain: Not on file  . Food insecurity - worry: Not on file  . Food insecurity - inability: Not on file  . Transportation needs - medical: Not on file  . Transportation needs - non-medical: Not on file  Occupational History  . Occupation: Orthoptist supply    Employer: [employer]  Tobacco Use  . Smoking status: Never Smoker  . Smokeless tobacco: Never Used  Substance and Sexual Activity  . Alcohol use: Yes    Comment: occ  . Drug use: No  . Sexual activity: No    Birth control/protection: None, Surgical  Other Topics Concern  . Not on file  Social History Narrative  . Not on file    FAMILY HISTORY: Family History  Problem Relation Age of Onset  . Cancer Father   . Diabetes Father   . Hypertension Father   . Cancer Maternal Aunt   . Heart attack Paternal Aunt   .  Breast cancer Paternal Aunt   . Breast cancer Cousin   . Anesthesia problems Neg Hx     ALLERGIES:  has No Known Allergies.  MEDICATIONS:  Current Outpatient Medications  Medication Sig Dispense Refill  . cholecalciferol (VITAMIN D) 1000 UNITS tablet Take 1,000 Units by mouth daily.    . clonazePAM (KLONOPIN) 0.5 MG tablet Take 1 tablet (0.5 mg total) by mouth 3 (three) times daily as needed for anxiety. 90 tablet 5  . cyclobenzaprine (FLEXERIL) 10 MG tablet Take 10 mg by mouth 3 (three) times daily as needed for muscle spasms.    . Misc Natural Products (ACAI+SUPERFRUIT/GREEN TEA) TABS Take 1 tablet by mouth daily.    . Multiple Vitamin (MULITIVITAMIN WITH MINERALS) TABS Take 1 tablet by mouth daily. Plus vit C 589m    . naproxen (NAPROSYN) 500 MG tablet Take 500 mg by mouth every 12 (twelve) hours as needed.     No current facility-administered medications for this visit.     REVIEW OF SYSTEMS:   Constitutional: Denies fevers, chills or abnormal night sweats Eyes: Denies blurriness of vision, double vision or watery eyes Ears, nose, mouth, throat, and face: Denies mucositis or sore throat Respiratory: Denies cough, dyspnea or wheezes Cardiovascular: Denies palpitation, chest discomfort or lower extremity swelling Gastrointestinal:  Denies nausea, heartburn or change in bowel habits Skin: Denies abnormal skin rashes Lymphatics: Denies new lymphadenopathy or easy bruising Neurological:Denies numbness, tingling or new weaknesses Behavioral/Psych: Mood is stable, no new changes  Breast: Large palpable nodule in the right breast laterally occupying the entire upper quadrant of the breast with skin changes overlying the lump.  No palpable lymphadenopathy. All other systems were reviewed with the patient and are negative.  PHYSICAL EXAMINATION: ECOG PERFORMANCE STATUS: 1 - Symptomatic but completely ambulatory  Vitals:   01/13/17 0906  BP: 127/85  Pulse: (!) 111  Resp: 19  Temp:  (!) 97.5 F (36.4 C)  SpO2: 99%   Filed Weights   01/13/17 0906  Weight: 158 lb 3.2 oz (71.8 kg)    GENERAL:alert, no distress and comfortable SKIN: skin color, texture, turgor are normal, no rashes or significant lesions EYES: normal, conjunctiva are pink and non-injected, sclera clear OROPHARYNX:no exudate, no erythema and lips, buccal mucosa, and tongue normal  NECK: supple, thyroid normal size, non-tender, without nodularity LYMPH:  no palpable lymphadenopathy in the cervical, axillary or inguinal LUNGS: clear to auscultation and percussion with normal breathing effort HEART: regular rate & rhythm and no murmurs and no lower extremity edema ABDOMEN:abdomen soft, non-tender and normal bowel sounds Musculoskeletal:no cyanosis of digits and no clubbing  PSYCH: alert & oriented x 3 with fluent speech NEURO: no focal motor/sensory deficits BREAST: Large palpable mass in the right breast.  It measures 8 cm in its widest dimension.  No palpable axillary or supraclavicular lymphadenopathy (exam performed in the presence of a chaperone)   LABORATORY DATA:  I have reviewed the data as listed Lab  Results  Component Value Date   WBC 7.2 01/13/2017   HGB 14.9 01/13/2017   HCT 43.9 01/13/2017   MCV 89.2 01/13/2017   PLT 268 01/13/2017   Lab Results  Component Value Date   NA 140 01/13/2017   K 4.1 01/13/2017   CL 102 07/24/2014   CO2 27 01/13/2017    RADIOGRAPHIC STUDIES: I have personally reviewed the radiological reports and agreed with the findings in the report.  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  T3N0 stage IIIb  Pathology and radiology counseling: Discussed with the patient, the details of pathology including the type of breast cancer,the clinical staging, the significance of ER, PR and HER-2/neu receptors and the implications for treatment. After reviewing the pathology in detail, we proceeded to discuss the different treatment options between surgery, radiation, chemotherapy, antiestrogen therapies.  Recommendation based on multidisciplinary tumor board: 1. Neoadjuvant chemotherapy with Adriamycin and Cytoxan dose dense 4 followed by Abraxane weekly 12 2. Followed by breast conserving surgery with sentinel lymph node study vs targeted axillary dissection 3. Followed by adjuvant radiation therapy  Chemotherapy Counseling: I discussed the risks and benefits of chemotherapy including the risks of nausea/ vomiting, risk of infection from low WBC count, fatigue due to chemo or anemia, bruising or bleeding due to low platelets, mouth sores, loss/ change in taste and decreased appetite. Liver and kidney function will be monitored through out chemotherapy as abnormalities in liver and kidney function may be a side effect of treatment. Cardiac dysfunction due to Adriamycin was discussed in detail. Risk of permanent bone marrow dysfunction due to chemo were also discussed.  Plan: 1. Port placement  2. Echocardiogram 3. Chemotherapy class 4. Breast MRI 5. CT chest abdomen pelvis and bone scan for staging Genetic counseling will also be arranged  Patient will need financial counseling and help because she lacks insurance and is very worried about her finances. She tells me that she has 2 daughters who live in town. Return to clinic in 2 weeks to start chemotherapy.    All questions were answered. The patient knows to call the clinic with any problems, questions or concerns.    Harriette Ohara, MD 01/13/17

## 2017-01-13 NOTE — Progress Notes (Signed)
Nutrition Assessment  Reason for Assessment:  Pt seen in Breast Clinic  ASSESSMENT:   57 year old female with new diagnosis of breast cancer.  Past medical history of anxiety, endometriosis.  Patient reports good/normal appetite  Medications:  reviewed  Labs: reviewed  Anthropometrics:   Height: 62 inches Weight: 151 lb 12.8 oz BMI: 27   NUTRITION DIAGNOSIS: Food and nutrition related knowledge deficit related to new diagnosis of breast cancer as evidenced by no prior need for nutrition related information.  INTERVENTION:   Discussed and provided packet of information regarding nutritional tips for breast cancer patients.  Questions answered.  Teachback method used.  Contact information provided and patient knows to contact me with questions/concerns.    MONITORING, EVALUATION, and GOAL: Pt will consume a healthy plant based diet to maintain lean body mass throughout treatment.   Kathryn [name] B. Zenia Resides, [place], [place] Registered Dietitian [phone] (pager)

## 2017-01-13 NOTE — Telephone Encounter (Signed)
No 1/2 los at check out

## 2017-01-15 ENCOUNTER — Encounter: Payer: Self-pay | Admitting: *Deleted

## 2017-01-18 ENCOUNTER — Telehealth: Payer: Self-pay | Admitting: Hematology and Oncology

## 2017-01-18 ENCOUNTER — Inpatient Hospital Stay: Payer: Medicaid Other

## 2017-01-18 ENCOUNTER — Other Ambulatory Visit: Payer: Self-pay | Admitting: Hematology and Oncology

## 2017-01-18 ENCOUNTER — Ambulatory Visit (HOSPITAL_COMMUNITY)
Admission: RE | Admit: 2017-01-18 | Discharge: 2017-01-18 | Disposition: A | Discharge status: Home / Self Care | Payer: Medicaid Other | Source: Ambulatory Visit | Attending: Hematology and Oncology | Admitting: Hematology and Oncology

## 2017-01-18 DIAGNOSIS — Z171 Estrogen receptor negative status [ER-]: Secondary | ICD-10-CM | POA: Diagnosis not present

## 2017-01-18 DIAGNOSIS — C50919 Malignant neoplasm of unspecified site of unspecified female breast: Secondary | ICD-10-CM

## 2017-01-18 DIAGNOSIS — Z01810 Encounter for preprocedural cardiovascular examination: Secondary | ICD-10-CM | POA: Diagnosis not present

## 2017-01-18 DIAGNOSIS — C50411 Malignant neoplasm of upper-outer quadrant of right female breast: Secondary | ICD-10-CM

## 2017-01-18 NOTE — Telephone Encounter (Signed)
Scheduled appt per 1/2 sch message - patient to get an updated schedule with chemo education class.

## 2017-01-18 NOTE — Progress Notes (Signed)
  Echocardiogram 2D Echocardiogram has been performed.  Darlina Sicilian M 01/18/2017, 10:06 AM

## 2017-01-19 ENCOUNTER — Encounter: Payer: Self-pay | Admitting: Hematology and Oncology

## 2017-01-19 ENCOUNTER — Ambulatory Visit (HOSPITAL_COMMUNITY)
Admission: RE | Admit: 2017-01-19 | Discharge: 2017-01-19 | Disposition: A | Discharge status: Home / Self Care | Payer: Medicaid Other | Source: Ambulatory Visit | Attending: Hematology and Oncology | Admitting: Hematology and Oncology

## 2017-01-19 DIAGNOSIS — C50411 Malignant neoplasm of upper-outer quadrant of right female breast: Secondary | ICD-10-CM | POA: Insufficient documentation

## 2017-01-19 DIAGNOSIS — Z171 Estrogen receptor negative status [ER-]: Secondary | ICD-10-CM | POA: Diagnosis not present

## 2017-01-19 DIAGNOSIS — R590 Localized enlarged lymph nodes: Secondary | ICD-10-CM | POA: Insufficient documentation

## 2017-01-19 MED ORDER — GADOBENATE DIMEGLUMINE 529 MG/ML IV SOLN
15.0000 mL | Freq: Once | INTRAVENOUS | Status: AC | PRN
  Administered 2017-01-19: 15 mL via INTRAVENOUS

## 2017-01-19 NOTE — Progress Notes (Signed)
Reviewed 3 day look-back and identified patient as being uninsured. Called patient to introduce myself as Arboriculturist. Asked patient about coverage and if she has applied for any recently. Patient states she has applied with someone and application was being sent to [facility]. Reviewed notes and saw a note in from [place] regarding  [place] Medicaid. Patient states she was assured she would be approved and they would retro back to December to cover.  Discussed Advertising account executive with patient. Patient currently working a little on Sundays and has been living off of her savings. Patient would like to apply for grant. Advised patient to bring proof of her income and how she supports herself. Patient advised to bring bank statement and check stub for [employer]. Advised patient she can meet with me on 01/22/16 after lab to apply for grant. She verbalized understanding and has my name to ask for after lab.

## 2017-01-21 ENCOUNTER — Other Ambulatory Visit: Payer: Self-pay

## 2017-01-21 ENCOUNTER — Other Ambulatory Visit: Payer: Self-pay | Admitting: Radiology

## 2017-01-21 ENCOUNTER — Telehealth: Payer: Self-pay | Admitting: *Deleted

## 2017-01-21 ENCOUNTER — Other Ambulatory Visit: Payer: Self-pay | Admitting: *Deleted

## 2017-01-21 DIAGNOSIS — C50411 Malignant neoplasm of upper-outer quadrant of right female breast: Secondary | ICD-10-CM

## 2017-01-21 DIAGNOSIS — Z171 Estrogen receptor negative status [ER-]: Principal | ICD-10-CM

## 2017-01-21 NOTE — Telephone Encounter (Signed)
Left vm regarding port with IR. Contact information provided.

## 2017-01-22 ENCOUNTER — Ambulatory Visit (HOSPITAL_COMMUNITY)
Admission: RE | Admit: 2017-01-22 | Discharge: 2017-01-22 | Disposition: A | Discharge status: Home / Self Care | Payer: Medicaid Other | Source: Ambulatory Visit | Attending: Hematology and Oncology | Admitting: Hematology and Oncology

## 2017-01-22 ENCOUNTER — Inpatient Hospital Stay: Payer: Medicaid Other | Admitting: *Deleted

## 2017-01-22 ENCOUNTER — Encounter (HOSPITAL_COMMUNITY)
Admission: RE | Admit: 2017-01-22 | Discharge: 2017-01-22 | Disposition: A | Discharge status: Home / Self Care | Payer: Medicaid Other | Source: Ambulatory Visit | Attending: Hematology and Oncology | Admitting: Hematology and Oncology

## 2017-01-22 ENCOUNTER — Encounter (HOSPITAL_COMMUNITY): Payer: Medicaid Other

## 2017-01-22 ENCOUNTER — Other Ambulatory Visit: Payer: Self-pay

## 2017-01-22 DIAGNOSIS — R911 Solitary pulmonary nodule: Secondary | ICD-10-CM | POA: Diagnosis not present

## 2017-01-22 DIAGNOSIS — R590 Localized enlarged lymph nodes: Secondary | ICD-10-CM | POA: Diagnosis not present

## 2017-01-22 DIAGNOSIS — C50411 Malignant neoplasm of upper-outer quadrant of right female breast: Secondary | ICD-10-CM

## 2017-01-22 DIAGNOSIS — Z171 Estrogen receptor negative status [ER-]: Principal | ICD-10-CM

## 2017-01-22 DIAGNOSIS — K760 Fatty (change of) liver, not elsewhere classified: Secondary | ICD-10-CM | POA: Insufficient documentation

## 2017-01-22 DIAGNOSIS — K769 Liver disease, unspecified: Secondary | ICD-10-CM | POA: Diagnosis not present

## 2017-01-22 MED ORDER — IOPAMIDOL (ISOVUE-300) INJECTION 61%
INTRAVENOUS | Status: AC
  Filled 2017-01-22: qty 100

## 2017-01-22 MED ORDER — IOPAMIDOL (ISOVUE-300) INJECTION 61%
100.0000 mL | Freq: Once | INTRAVENOUS | Status: AC | PRN
Start: 2017-01-22 — End: 2017-01-22
  Administered 2017-01-22: 100 mL via INTRAVENOUS

## 2017-01-22 MED ORDER — TECHNETIUM TC 99M MEDRONATE IV KIT
21.3000 | PACK | Freq: Once | INTRAVENOUS | Status: AC | PRN
  Administered 2017-01-22: 21.3 via INTRAVENOUS

## 2017-01-22 NOTE — Progress Notes (Signed)
[id number] UpBeat  01/22/17 12:15 PM  Patient Kathryn [name] is here for participation in the UpBeat study.  See consent note for documentation of the informed consent process.  No study procedures were initiated prior to the signing of the informed consent form.  Patient came to this visit after completing scheduled radiology imaging.  At the time of consenting, the patient's morning scans had not yet been read nor resulted.  After providing consent the patient was provided with study required questionnaires and instructed on how to complete by this RN.  Patient was provided with ample time and space to complete the questionnaires without staff present.  See questionnaires for patient's complete responses.  Patient was registered as a new patient during this time and assigned study number [id number].  Following the completion of the questionnaires, Neurocognitive testing was completed with Vernetta Honey, Research Assistant.  See the Neurocognitive Testing packet for responses.  During the neurocognitive testing this RN reviewed the patient's responses to the questionnaire regarding depressive symptoms.  The patient had a score of 0, so no referral to PI Dr. Lindi Adie was made.  Following the completion of the neurocognitive testing, with this RN and Merceda Elks, RN the patient completed the Six Minute Walk and Disability Measures and SPPB.  See the testing booklet for documentation of the patient's scoring on each of these measures.    The patient inquired about the times of her upcoming appointments.  A January 2019 schedule was printed for and reviewed with the patient.  The patient was reminded to fast three hours before her upcoming research labwork, scheduled for 01/27/2017, to be collected at the same time as routine bloodwork.  Patient has agreed to participate in the optional genetics portion of the study and so additional sample will be drawn at that time also.   Timing of cardiac MRI was explained to  the patient.  Patient was thanked for her participation in the study.  Patient left the [facility] at approximately 2:15pm.  Following the completion of this visit, the CT scan from earlier in the day had now resulted (resulting at 13:32PM) and indicated "1.9 cm peripherally enhancing lesion in dome of right hepatic lobe, suspicious for liver metastasis."  If this is liver metastasis, then [place] will no longer be considered eligible for the [id number] UpBeat study.  Results and plan based off of this new information are to be discussed with Dr. Lindi Adie prior to research staff speaking with the patient or changing upcoming research appointments.  Registration form on [facility] page to be completed following review with Dr. Lindi Adie.  Doreatha Martin, RN, BSN, [facility] 01/22/2017 3:17 PM   ________ 01/25/17 This RN met with Dr. Lindi Adie regarding the patient's scans.  The patient is no longer eligible for UpBeat due to possible metastasis.  See Dr. Geralyn Flash progress note from 01/25/2017.  Cardiac MRI has been canceled and patient to be listed as a screen failure.  No further study procedures will be completed. Doreatha Martin, RN, BSN, [facility] 01/26/2017 10:16 AM

## 2017-01-25 ENCOUNTER — Other Ambulatory Visit: Payer: Self-pay | Admitting: Radiology

## 2017-01-25 ENCOUNTER — Inpatient Hospital Stay (HOSPITAL_BASED_OUTPATIENT_CLINIC_OR_DEPARTMENT_OTHER): Payer: Medicaid Other | Admitting: Hematology and Oncology

## 2017-01-25 ENCOUNTER — Encounter: Payer: Self-pay | Admitting: *Deleted

## 2017-01-25 DIAGNOSIS — Z171 Estrogen receptor negative status [ER-]: Secondary | ICD-10-CM

## 2017-01-25 DIAGNOSIS — K769 Liver disease, unspecified: Secondary | ICD-10-CM | POA: Diagnosis not present

## 2017-01-25 DIAGNOSIS — C50811 Malignant neoplasm of overlapping sites of right female breast: Secondary | ICD-10-CM | POA: Diagnosis not present

## 2017-01-25 DIAGNOSIS — Z006 Encounter for examination for normal comparison and control in clinical research program: Secondary | ICD-10-CM | POA: Diagnosis not present

## 2017-01-25 DIAGNOSIS — C50411 Malignant neoplasm of upper-outer quadrant of right female breast: Secondary | ICD-10-CM

## 2017-01-25 DIAGNOSIS — Z5111 Encounter for antineoplastic chemotherapy: Secondary | ICD-10-CM | POA: Diagnosis not present

## 2017-01-25 DIAGNOSIS — R5383 Other fatigue: Secondary | ICD-10-CM | POA: Diagnosis not present

## 2017-01-25 DIAGNOSIS — Z79899 Other long term (current) drug therapy: Secondary | ICD-10-CM | POA: Diagnosis not present

## 2017-01-25 DIAGNOSIS — R21 Rash and other nonspecific skin eruption: Secondary | ICD-10-CM | POA: Diagnosis not present

## 2017-01-25 DIAGNOSIS — K7689 Other specified diseases of liver: Secondary | ICD-10-CM | POA: Diagnosis not present

## 2017-01-25 NOTE — Progress Notes (Signed)
Patient Care Team: [name], MD as PCP - General (Family Medicine)  DIAGNOSIS:  Encounter Diagnosis  Name Primary?  . 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  T3N0 stage IIIb      01/19/2017 Breast MRI    Right breast 10 x 8.3 x 6.9 cm area of cancer involving all 4 quadrants with skin involvement extends from nipple to pectoralis muscle and involving right axillary lymph nodes, right internal mammary nodes      01/22/2017 Imaging    CT chest abdomen pelvis reveals a 6 cm right breast mass, mild right axillary lymphadenopathy, 1.9 cm peripheral enhancing lesion in the dome of the right hepatic lobe, 9 mm groundglass nodule right lung apex       CHIEF COMPLIANT: Follow-up to discuss the results of the CT scans and breast MRI  INTERVAL HISTORY: Kathryn [name] is a 57 year old with above-mentioned history of right breast cancer who is here to discuss recent CT scans that she had performed.  CT scan showed a 1.9 cm lesion in the right dome of the liver.  This is suspicious for metastatic disease in patient is here to discuss the next steps in treatment plan.  REVIEW OF SYSTEMS:   Constitutional: Denies fevers, chills or abnormal weight loss Eyes: Denies blurriness of vision Ears, nose, mouth, throat, and face: Denies mucositis or sore throat Respiratory: Denies cough, dyspnea or wheezes Cardiovascular: Denies palpitation, chest  discomfort Gastrointestinal:  Denies nausea, heartburn or change in bowel habits Skin: Denies abnormal skin rashes Lymphatics: Denies new lymphadenopathy or easy bruising Neurological:Denies numbness, tingling or new weaknesses Behavioral/Psych: Mood is stable, no new changes  Extremities: No lower extremity edema Breast: Large tumor in the right breast with involvement of skin All other systems were reviewed with the patient and are negative.  I have reviewed the past medical history, past surgical history, social history and family history with the patient and they are unchanged from previous note.  ALLERGIES:  has No Known Allergies.  MEDICATIONS:  Current Outpatient Medications  Medication Sig Dispense Refill  . cholecalciferol (VITAMIN D) 1000 UNITS tablet Take 1,000 Units by mouth daily.    . clonazePAM (KLONOPIN) 0.5 MG tablet Take 1 tablet (0.5 mg total) by mouth 3 (three) times daily as needed for anxiety. 90 tablet 5  . cyclobenzaprine (FLEXERIL) 10 MG tablet Take 10 mg by mouth 3 (three) times daily as needed for muscle spasms.    Marland Kitchen dexamethasone (DECADRON) 4 MG tablet 1 tablet day after chemo and 1 tablet 2 days after chemo with food 8 tablet 0  . lidocaine-prilocaine (EMLA) cream Apply to affected area once (Patient taking differently: Apply 1 application topically 3 times/day as needed-between meals & bedtime. Apply to affected area once) 30 g 3  . LORazepam (ATIVAN) 0.5 MG tablet Take 1 tablet (0.5 mg total) by mouth at bedtime as needed (Nausea or vomiting). 30 tablet 0  . Misc Natural Products (ACAI+SUPERFRUIT/GREEN TEA) TABS Take 1 tablet by  mouth daily.    . Multiple Vitamin (MULITIVITAMIN WITH MINERALS) TABS Take 1 tablet by mouth daily. Plus vit C '500mg'$     . naproxen (NAPROSYN) 500 MG tablet Take 500 mg by mouth every 12 (twelve) hours as needed.    . ondansetron (ZOFRAN) 8 MG tablet Take 1 tablet (8 mg total) by mouth 2 (two) times daily as needed. Start on the third  day after chemotherapy. 30 tablet 1  . prochlorperazine (COMPAZINE) 10 MG tablet Take 1 tablet (10 mg total) by mouth every 6 (six) hours as needed (Nausea or vomiting). 30 tablet 1   No current facility-administered medications for this visit.     PHYSICAL EXAMINATION: ECOG PERFORMANCE STATUS: 1 - Symptomatic but completely ambulatory  Vitals:   01/25/17 1503  BP: (!) 127/91  Pulse: (!) 115  Resp: 18  Temp: (!) 97.5 F (36.4 C)  SpO2: 100%   Filed Weights   01/25/17 1503  Weight: 160 lb 6.4 oz (72.8 kg)    GENERAL:alert, no distress and comfortable SKIN: skin color, texture, turgor are normal, no rashes or significant lesions EYES: normal, Conjunctiva are pink and non-injected, sclera clear OROPHARYNX:no exudate, no erythema and lips, buccal mucosa, and tongue normal  NECK: supple, thyroid normal size, non-tender, without nodularity LYMPH:  no palpable lymphadenopathy in the cervical, axillary or inguinal LUNGS: clear to auscultation and percussion with normal breathing effort HEART: regular rate & rhythm and no murmurs and no lower extremity edema ABDOMEN:abdomen soft, non-tender and normal bowel sounds MUSCULOSKELETAL:no cyanosis of digits and no clubbing  NEURO: alert & oriented x 3 with fluent speech, no focal motor/sensory deficits EXTREMITIES: No lower extremity edema  LABORATORY DATA:  I have reviewed the data as listed CMP Latest Ref Rng & Units 01/13/2017 07/24/2014  Glucose 70 - 140 mg/dl 110 99  BUN 7.0 - 26.0 mg/dL 13.4 10  Creatinine 0.6 - 1.1 mg/dL 0.9 0.85  Sodium 136 - 145 mEq/L 140 140  Potassium 3.5 - 5.1 mEq/L 4.1 3.7  Chloride 101 - 111 mmol/L - 102  CO2 22 - 29 mEq/L 27 27  Calcium 8.4 - 10.4 mg/dL 9.2 9.1  Total Protein 6.4 - 8.3 g/dL 6.9 7.6  Total Bilirubin 0.20 - 1.20 mg/dL 0.39 0.6  Alkaline Phos 40 - 150 U/L 87 82  AST 5 - 34 U/L 24 26  ALT 0 - 55 U/L 34 24    Lab Results  Component Value Date   WBC 7.2 01/13/2017   HGB 14.9 01/13/2017    HCT 43.9 01/13/2017   MCV 89.2 01/13/2017   PLT 268 01/13/2017   NEUTROABS 4.8 01/13/2017    ASSESSMENT & PLAN:  Malignant neoplasm of upper-outer quadrant of right breast in female, estrogen receptor negative ([place]) 12/29/2016: Palpable right breast lump for 6 months, mammogram revealed 6 cm mass at 1 o'clock position 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  T3N0 stage IIIb  01/19/2017:Right breast 10 x 8.3 x 6.9 cm area of cancer involving all 4 quadrants with skin involvement extends from nipple to pectoralis muscle and involving right axillary lymph nodes, right internal mammary nodes  01/22/2017: CT CAP: 1.9 cm dome of the right hepatic lobe suspicious for liver metastases, 9 mm groundglass nodule in the right lung apex could be inflammation versus cancer  Radiology discussion: I discussed with the patient the  results of the scans. Recommendation: Biopsy of the liver lesion I discussed the case with interventional radiology.  They will reviewed the CT scan once again.  It appears that she had a similar lesion in 2013. If there is no significant change then she may not need a biopsy.  Because there is enough concern for metastatic disease, we will remove her from UPBEAT clinical trial.  I instructed our research staff to cancel the heart MRI.  If it is metastatic disease and it is the only site of metastatic disease I would still treat her with definitive chemotherapy. I reviewed all the scans with her including the breast MRI. I also reviewed the antiemetic regimen in great detail.  Patient will start her chemotherapy this Thursday and I will see her back following week to discuss toxicities from treatment.   I spent 25 minutes talking to the patient of which more than half was spent in counseling and coordination of  care.  No orders of the defined types were placed in this encounter.  The patient has a good understanding of the overall plan. she agrees with it. she will call with any problems that may develop before the next visit here.   Harriette Ohara, MD 01/25/17

## 2017-01-25 NOTE — Assessment & Plan Note (Signed)
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  T3N0 stage IIIb  01/19/2017:Right breast 10 x 8.3 x 6.9 cm area of cancer involving all 4 quadrants with skin involvement extends from nipple to pectoralis muscle and involving right axillary lymph nodes, right internal mammary nodes  01/22/2017: CT CAP: 1.9 cm dome of the right hepatic lobe suspicious for liver metastases, 9 mm groundglass nodule in the right lung apex could be inflammation versus cancer  Radiology discussion: I discussed with the patient the results of the scans. Recommendation: Biopsy of the liver lesion Plan further therapy depending on the biopsy. If it is metastatic disease and it is the only site of metastatic disease I would still treat her with definitive chemotherapy.  Return to clinic based upon biopsy results.

## 2017-01-26 ENCOUNTER — Ambulatory Visit (HOSPITAL_COMMUNITY)
Admission: RE | Admit: 2017-01-26 | Discharge: 2017-01-26 | Disposition: A | Discharge status: Home / Self Care | Payer: Medicaid Other | Source: Ambulatory Visit | Attending: Hematology and Oncology | Admitting: Hematology and Oncology

## 2017-01-26 ENCOUNTER — Other Ambulatory Visit: Payer: Self-pay | Admitting: Hematology and Oncology

## 2017-01-26 ENCOUNTER — Encounter (HOSPITAL_COMMUNITY): Payer: Self-pay | Admitting: Diagnostic Radiology

## 2017-01-26 ENCOUNTER — Telehealth: Payer: Self-pay | Admitting: Hematology and Oncology

## 2017-01-26 ENCOUNTER — Encounter: Payer: Self-pay | Admitting: Pharmacy Technician

## 2017-01-26 DIAGNOSIS — Z79899 Other long term (current) drug therapy: Secondary | ICD-10-CM | POA: Insufficient documentation

## 2017-01-26 DIAGNOSIS — Z171 Estrogen receptor negative status [ER-]: Secondary | ICD-10-CM

## 2017-01-26 DIAGNOSIS — C50411 Malignant neoplasm of upper-outer quadrant of right female breast: Secondary | ICD-10-CM

## 2017-01-26 DIAGNOSIS — F418 Other specified anxiety disorders: Secondary | ICD-10-CM | POA: Insufficient documentation

## 2017-01-26 DIAGNOSIS — Z791 Long term (current) use of non-steroidal anti-inflammatories (NSAID): Secondary | ICD-10-CM | POA: Diagnosis not present

## 2017-01-26 HISTORY — PX: IR IMAGING GUIDED PORT INSERTION: IMG5740

## 2017-01-26 HISTORY — PX: IR US GUIDE VASC ACCESS LEFT: IMG2389

## 2017-01-26 LAB — BASIC METABOLIC PANEL
Anion gap: 12 (ref 5–15)
BUN: 18 mg/dL (ref 6–20)
CHLORIDE: 101 mmol/L (ref 101–111)
CO2: 25 mmol/L (ref 22–32)
CREATININE: 0.83 mg/dL (ref 0.44–1.00)
Calcium: 9 mg/dL (ref 8.9–10.3)
GFR calc non Af Amer: >60 mL/min (ref >60)
Glucose, Bld: 132 mg/dL — ABNORMAL HIGH (ref 65–99)
Potassium: 3.9 mmol/L (ref 3.5–5.1)
SODIUM: 138 mmol/L (ref 135–145)

## 2017-01-26 LAB — CBC
HCT: 47.3 % — ABNORMAL HIGH (ref 36.0–46.0)
Hemoglobin: 15.7 g/dL — ABNORMAL HIGH (ref 12.0–15.0)
MCH: 30 pg (ref 26.0–34.0)
MCHC: 33.2 g/dL (ref 30.0–36.0)
MCV: 90.3 fL (ref 78.0–100.0)
PLATELETS: 290 10*3/uL (ref 150–400)
RBC: 5.24 MIL/uL — AB (ref 3.87–5.11)
RDW: 13 % (ref 11.5–15.5)
WBC: 7.9 10*3/uL (ref 4.0–10.5)

## 2017-01-26 LAB — PROTIME-INR
INR: 0.85
PROTHROMBIN TIME: 11.5 s (ref 11.4–15.2)

## 2017-01-26 MED ORDER — CEFAZOLIN SODIUM-DEXTROSE 2-4 GM/100ML-% IV SOLN
2.0000 g | INTRAVENOUS | Status: AC
  Administered 2017-01-26: 2 g via INTRAVENOUS

## 2017-01-26 MED ORDER — HEPARIN SOD (PORK) LOCK FLUSH 100 UNIT/ML IV SOLN
INTRAVENOUS | Status: AC
  Filled 2017-01-26: qty 5

## 2017-01-26 MED ORDER — LIDOCAINE HCL 1 % IJ SOLN
INTRAMUSCULAR | Status: AC
  Filled 2017-01-26: qty 20

## 2017-01-26 MED ORDER — HEPARIN SOD (PORK) LOCK FLUSH 100 UNIT/ML IV SOLN
INTRAVENOUS | Status: AC | PRN
  Administered 2017-01-26: 500 [IU] via INTRAVENOUS

## 2017-01-26 MED ORDER — CEFAZOLIN SODIUM-DEXTROSE 2-4 GM/100ML-% IV SOLN
INTRAVENOUS | Status: AC
  Administered 2017-01-26: 2 g via INTRAVENOUS
  Filled 2017-01-26: qty 100

## 2017-01-26 MED ORDER — FENTANYL CITRATE (PF) 100 MCG/2ML IJ SOLN
INTRAMUSCULAR | Status: AC
  Filled 2017-01-26: qty 4

## 2017-01-26 MED ORDER — LIDOCAINE-EPINEPHRINE (PF) 1 %-1:200000 IJ SOLN
INTRAMUSCULAR | Status: AC | PRN
Start: 2017-01-26 — End: 2017-01-26
  Administered 2017-01-26: 2 mL

## 2017-01-26 MED ORDER — FENTANYL CITRATE (PF) 100 MCG/2ML IJ SOLN
INTRAMUSCULAR | Status: AC | PRN
  Administered 2017-01-26 (×3): 50 ug via INTRAVENOUS

## 2017-01-26 MED ORDER — LIDOCAINE HCL (PF) 1 % IJ SOLN
INTRAMUSCULAR | Status: AC | PRN
  Administered 2017-01-26: 18 mL

## 2017-01-26 MED ORDER — LIDOCAINE-EPINEPHRINE (PF) 1 %-1:200000 IJ SOLN
INTRAMUSCULAR | Status: AC
  Filled 2017-01-26: qty 30

## 2017-01-26 MED ORDER — SODIUM CHLORIDE 0.9 % IV SOLN: INTRAVENOUS | Status: DC

## 2017-01-26 MED ORDER — MIDAZOLAM HCL 2 MG/2ML IJ SOLN
INTRAMUSCULAR | Status: AC
  Filled 2017-01-26: qty 4

## 2017-01-26 MED ORDER — MIDAZOLAM HCL 2 MG/2ML IJ SOLN
INTRAMUSCULAR | Status: AC | PRN
  Administered 2017-01-26 (×3): 1 mg via INTRAVENOUS

## 2017-01-26 NOTE — Progress Notes (Addendum)
I give Kathryn [name] the drug assist applications for Neulasta and Emend.

## 2017-01-26 NOTE — Discharge Instructions (Signed)
Implanted Port Insertion, Care After °This sheet gives you information about how to care for yourself after your procedure. Your health care provider may also give you more specific instructions. If you have problems or questions, contact your health care provider. °What can I expect after the procedure? °After your procedure, it is common to have: °· Discomfort at the port insertion site. °· Bruising on the skin over the port. This should improve over 3-4 days. ° °Follow these instructions at home: °Port care °· After your port is placed, you will get a manufacturer's information card. The card has information about your port. Keep this card with you at all times. °· Take care of the port as told by your health care provider. Ask your health care provider if you or a family member can get training for taking care of the port at home. A home health care nurse may also take care of the port. °· Make sure to remember what type of port you have. °Incision care °· Follow instructions from your health care provider about how to take care of your port insertion site. Make sure you: °? Wash your hands with soap and water before you change your bandage (dressing). If soap and water are not available, use hand sanitizer. °? Change your dressing as told by your health care provider. °? Leave stitches (sutures), skin glue, or adhesive strips in place. These skin closures may need to stay in place for 2 weeks or longer. If adhesive strip edges start to loosen and curl up, you may trim the loose edges. Do not remove adhesive strips completely unless your health care provider tells you to do that. °· Check your port insertion site every day for signs of infection. Check for: °? More redness, swelling, or pain. °? More fluid or blood. °? Warmth. °? Pus or a bad smell. °General instructions °· Do not take baths, swim, or use a hot tub until your health care provider approves. °· Do not lift anything that is heavier than 10 lb (4.5  kg) for a week, or as told by your health care provider. °· Ask your health care provider when it is okay to: °? Return to work or school. °? Resume usual physical activities or sports. °· Do not drive for 24 hours if you were given a medicine to help you relax (sedative). °· Take over-the-counter and prescription medicines only as told by your health care provider. °· Wear a medical alert bracelet in case of an emergency. This will tell any health care providers that you have a port. °· Keep all follow-up visits as told by your health care provider. This is important. °Contact a health care provider if: °· You cannot flush your port with saline as directed, or you cannot draw blood from the port. °· You have a fever or chills. °· You have more redness, swelling, or pain around your port insertion site. °· You have more fluid or blood coming from your port insertion site. °· Your port insertion site feels warm to the touch. °· You have pus or a bad smell coming from the port insertion site. °Get help right away if: °· You have chest pain or shortness of breath. °· You have bleeding from your port that you cannot control. °Summary °· Take care of the port as told by your health care provider. °· Change your dressing as told by your health care provider. °· Keep all follow-up visits as told by your health care provider. °  This information is not intended to replace advice given to you by your health care provider. Make sure you discuss any questions you have with your health care provider. °Document Released: 10/19/2012 Document Revised: 11/20/2015 Document Reviewed: 11/20/2015 °Elsevier Interactive Patient Education © 2017 [employer] ° °

## 2017-01-26 NOTE — Sedation Documentation (Signed)
Patient denies pain and is resting comfortably.  

## 2017-01-26 NOTE — Sedation Documentation (Signed)
Patient is resting comfortably. 

## 2017-01-26 NOTE — Procedures (Signed)
Placement of left jugular port.  Tip at SVC/RA junction.  Minimal blood loss and no immediate complication. 

## 2017-01-26 NOTE — H&P (Signed)
Chief Complaint: Patient was seen in consultation today for breast cancer  Referring Physician(s): Gudena,Vinay  Supervising Physician: Markus Daft  Patient Status: [facility] - Out-pt  History of Present Illness: Kathryn [name] is a 57 y.o. female past medical history of anxiety, endometriosis s/p hysterectomy who presents with complaint of breast cancer. Patient with plans for upcoming chemotherapy and is in need of Port-A-Cath placement.   Patient presents for procedure today in his usual state of health. She denies fever, chills, abdominal pain, shortness of breath, or cough.  She has been NPO.  She does not take blood thinners.   Past Medical History:  Diagnosis Date  . Anxiety   . Depression with anxiety   . Endometrioma   . Endometriosis   . Mass of pelvis   . Torticollis, unspecified NECK MUSCLE--  OCCASIONAL    Past Surgical History:  Procedure Laterality Date  . ABDOMINAL HYSTERECTOMY  06/29/2011   Procedure: HYSTERECTOMY ABDOMINAL;  Surgeon: Selinda Orion, MD;  Location: [facility];  Service: Gynecology;  Laterality: N/A;  PROCEDURE READS: EXPLORATORY LAPAROTOMY, TOTAL ABDOMINAL HYSTERECTOMY WITH BSO AND  SUPERCERVICAL HYSTERECTOMY WITH BSO AND EXCISION OF ENDOMETRIOMIAS OWER  . BENIGN BREAST CYST REMOVED  YRS AGO  . ENDOMETRIAL ABLATION  06/29/2011   Procedure: ENDOMETRIAL ABLATION;  Surgeon: Selinda Orion, MD;  Location: [facility];  Service: Gynecology;  Laterality: N/A;  . EXCISION OF A HIDRADENITIS ABSCESS, LEFT INGUINAL AREA  03-30-2005  . LAPAROTOMY  06/29/2011   Procedure: EXPLORATORY LAPAROTOMY;  Surgeon: Selinda Orion, MD;  Location: [facility];  Service: Gynecology;  Laterality: N/A;  . SALPINGOOPHORECTOMY  06/29/2011   Procedure: SALPINGO OOPHERECTOMY;  Surgeon: Selinda Orion, MD;  Location: [facility];  Service: Gynecology;  Laterality: Bilateral;    Allergies: Patient has no known  allergies.  Medications: Prior to Admission medications   Medication Sig Start Date End Date Taking? Authorizing Provider  cholecalciferol (VITAMIN D) 1000 UNITS tablet Take 1,000 Units by mouth daily.   Yes [provider]  clonazePAM (KLONOPIN) 0.5 MG tablet Take 1 tablet (0.5 mg total) by mouth 3 (three) times daily as needed for anxiety. 10/28/16  Yes Dennie Bible, NP  cyclobenzaprine (FLEXERIL) 10 MG tablet Take 10 mg by mouth 3 (three) times daily as needed for muscle spasms.   Yes [provider]  Misc Natural Products (ACAI+SUPERFRUIT/GREEN TEA) TABS Take 1 tablet by mouth daily.   Yes [provider]  Multiple Vitamin (MULITIVITAMIN WITH MINERALS) TABS Take 1 tablet by mouth daily. Plus vit C 500mg    Yes [provider]  dexamethasone (DECADRON) 4 MG tablet 1 tablet day after chemo and 1 tablet 2 days after chemo with food 01/13/17   Nicholas Lose, MD  lidocaine-prilocaine (EMLA) cream Apply to affected area once Patient taking differently: Apply 1 application topically 3 times/day as needed-between meals & bedtime. Apply to affected area once 01/13/17   Nicholas Lose, MD  LORazepam (ATIVAN) 0.5 MG tablet Take 1 tablet (0.5 mg total) by mouth at bedtime as needed (Nausea or vomiting). 01/13/17   Nicholas Lose, MD  naproxen (NAPROSYN) 500 MG tablet Take 500 mg by mouth every 12 (twelve) hours as needed.    [provider]  ondansetron (ZOFRAN) 8 MG tablet Take 1 tablet (8 mg total) by mouth 2 (two) times daily as needed. Start on the third day after chemotherapy. 01/13/17   Nicholas Lose, MD  prochlorperazine (COMPAZINE) 10  MG tablet Take 1 tablet (10 mg total) by mouth every 6 (six) hours as needed (Nausea or vomiting). 01/13/17   Nicholas Lose, MD     Family History  Problem Relation Age of Onset  . Cancer Father   . Diabetes Father   . Hypertension Father   . Cancer Maternal Aunt   . Heart attack Paternal Aunt   . Breast cancer Paternal  Aunt   . Breast cancer Cousin   . Anesthesia problems Neg Hx     Social History   Socioeconomic History  . Marital status: Divorced    Spouse name: Not on file  . Number of children: 2  . Years of education: Not on file  . Highest education level: Not on file  Social Needs  . Financial resource strain: Not on file  . Food insecurity - worry: Not on file  . Food insecurity - inability: Not on file  . Transportation needs - medical: Not on file  . Transportation needs - non-medical: Not on file  Occupational History  . Occupation: Orthoptist supply    Employer: [employer]  Tobacco Use  . Smoking status: Never Smoker  . Smokeless tobacco: Never Used  Substance and Sexual Activity  . Alcohol use: Yes    Comment: occ  . Drug use: No  . Sexual activity: No    Birth control/protection: None, Surgical  Other Topics Concern  . Not on file  Social History Narrative  . Not on file    Review of Systems: A 12 point ROS discussed and pertinent positives are indicated in the HPI above.  All other systems are negative.  Review of Systems  Constitutional: Negative for fatigue and fever.  Respiratory: Negative for cough and shortness of breath.   Cardiovascular: Negative for chest pain.  Gastrointestinal: Negative for abdominal pain.  Psychiatric/Behavioral: Negative for behavioral problems and confusion.    Vital Signs: BP (!) 145/86 (BP Location: Right Arm)   Pulse (!) 109   Temp (!) 97.4 F (36.3 C) (Oral)   Ht 5\' 2"  (1.575 m)   Wt 160 lb (72.6 kg)   LMP 06/22/2011   SpO2 100%   BMI 29.26 kg/m   Physical Exam  Constitutional: She is oriented to person, place, and time. She appears well-developed.  Cardiovascular: Normal rate, regular rhythm and normal heart sounds.  Pulmonary/Chest: Effort normal and breath sounds normal. No respiratory distress.  Abdominal: Soft.  Neurological: She is alert and oriented to person, place, and time.  Skin: Skin is warm and  dry.  Psychiatric: She has a normal mood and affect. Her behavior is normal. Judgment and thought content normal.  Nursing note and vitals reviewed.   Imaging: Ct Chest W Contrast  Addendum Date: 01/26/2017   ADDENDUM REPORT: 01/26/2017 08:45 ADDENDUM: Dr. Lindi Adie was reached by telephone, and these results were discussed with him on 01/26/2017. Electronically Signed   By: Earle Gell M.D.   On: 01/26/2017 08:45   Addendum Date: 01/26/2017   ADDENDUM REPORT: 01/26/2017 08:29 ADDENDUM: The prior MRI in 2013 shows the presence of a 3.6 cm cyst in the dome of the right hepatic lobe, at or near the same location as the small peripherally enhancing lesion seen on today's CT. The lesion seen on today's exam could represent the same cyst which has considerably decreased in size, however the peripheral enhancement is atypical and a new liver metastasis cannot be excluded. Abdomen MRI without and with contrast is recommended for further  characterization of this finding. Dr. Lindi Adie could not be reached by telephone at time of this addendum. These results will be called to the Dr. Lindi Adie or representative by the Radiologist Assistant, and communication documented in the PACS or zVision Dashboard. Electronically Signed   By: Earle Gell M.D.   On: 01/26/2017 08:29   Result Date: 01/26/2017 CLINICAL DATA:  Newly diagnosed right breast carcinoma.  Staging. EXAM: CT CHEST, ABDOMEN, AND PELVIS WITH CONTRAST TECHNIQUE: Multidetector CT imaging of the chest, abdomen and pelvis was performed following the standard protocol during bolus administration of intravenous contrast. CONTRAST:  165mL ISOVUE-300 IOPAMIDOL (ISOVUE-300) INJECTION 61% COMPARISON:  Abdomen and pelvis MRI on 05/27/2011; no prior chest CT FINDINGS: CT CHEST FINDINGS Cardiovascular: No acute findings. Mediastinum/Lymph Nodes: Mild right axillary lymphadenopathy, with largest lymph node measuring 1.3 cm in short axis. No mediastinal or hilar lymphadenopathy.  Lungs/Pleura: A 9 mm ground-glass nodule is seen in the right lung apex on image 13/6. No other suspicious pulmonary nodules identified. No evidence of pulmonary infiltrate or pleural effusion. Musculoskeletal: 6 cm mass in the upper inner quadrant of right breast with central necrosis, consistent with known breast carcinoma. No suspicious bone lesions identified. CT ABDOMEN AND PELVIS FINDINGS Hepatobiliary: Moderate diffuse hepatic steatosis. A 1.9 cm low-attenuation lesion peripheral enhancement is seen dome of the right hepatic lobe on image 11/7, suspicious for liver metastasis. No other liver masses are identified. Pancreas:  No mass or inflammatory changes. Spleen:  Within normal limits in size and appearance. Adrenals/Urinary tract: Normal adrenal glands. No masses or hydronephrosis. Small left upper pole renal cyst incidentally noted. Stomach/Bowel: No evidence of obstruction, inflammatory process, or abnormal fluid collections. Normal appendix visualized. Vascular/Lymphatic: No pathologically enlarged lymph nodes identified. No abdominal aortic aneurysm. Aortic atherosclerosis. Reproductive: Prior hysterectomy noted. Adnexal regions are unremarkable in appearance. Other:  None. Musculoskeletal:  No suspicious bone lesions identified. IMPRESSION: 6 cm right breast mass, consistent with known primary breast carcinoma. Mild right axillary lymphadenopathy, consistent with metastatic disease. 1.9 cm peripherally enhancing lesion in dome of right hepatic lobe, suspicious for liver metastasis. Moderate hepatic steatosis. 9 mm ground-glass nodule in right lung apex, with differential diagnosis including low-grade bronchogenic adenocarcinoma and inflammatory or infectious etiologies. Consider short-term followup by CT in 3 months versus further evaluation PET-CT Electronically Signed: By: Earle Gell M.D. On: 01/22/2017 13:32   Nm Bone Scan Whole Body  Result Date: 01/22/2017 CLINICAL DATA:  57 year old female  for staging of newly diagnosed right breast cancer. EXAM: NUCLEAR MEDICINE WHOLE BODY BONE SCAN TECHNIQUE: Whole body anterior and posterior images were obtained approximately 3 hours after intravenous injection of radiopharmaceutical. RADIOPHARMACEUTICALS:  21.3 mCi Technetium-24m MDP IV COMPARISON:  01/22/2017 CTs FINDINGS: No abnormal areas bony activity are noted to suggest bony metastatic disease. Focal increased activity within the posterior right lower lumbar spine is compatible with degenerative/facet arthropathy as identified on CT. Increased soft tissue activity of the right breast is compatible with known right breast malignancy. IMPRESSION: 1. No evidence of bony metastatic disease. 2. Increased right breast soft tissue activity compatible with known malignancy. Electronically Signed   By: Margarette Canada M.D.   On: 01/22/2017 14:35   Ct Abdomen Pelvis W Contrast  Addendum Date: 01/26/2017   ADDENDUM REPORT: 01/26/2017 08:45 ADDENDUM: Dr. Lindi Adie was reached by telephone, and these results were discussed with him on 01/26/2017. Electronically Signed   By: Earle Gell M.D.   On: 01/26/2017 08:45   Addendum Date: 01/26/2017   ADDENDUM REPORT: 01/26/2017  08:29 ADDENDUM: The prior MRI in 2013 shows the presence of a 3.6 cm cyst in the dome of the right hepatic lobe, at or near the same location as the small peripherally enhancing lesion seen on today's CT. The lesion seen on today's exam could represent the same cyst which has considerably decreased in size, however the peripheral enhancement is atypical and a new liver metastasis cannot be excluded. Abdomen MRI without and with contrast is recommended for further characterization of this finding. Dr. Lindi Adie could not be reached by telephone at time of this addendum. These results will be called to the Dr. Lindi Adie or representative by the Radiologist Assistant, and communication documented in the PACS or zVision Dashboard. Electronically Signed   By: Earle Gell M.D.   On: 01/26/2017 08:29   Result Date: 01/26/2017 CLINICAL DATA:  Newly diagnosed right breast carcinoma.  Staging. EXAM: CT CHEST, ABDOMEN, AND PELVIS WITH CONTRAST TECHNIQUE: Multidetector CT imaging of the chest, abdomen and pelvis was performed following the standard protocol during bolus administration of intravenous contrast. CONTRAST:  116mL ISOVUE-300 IOPAMIDOL (ISOVUE-300) INJECTION 61% COMPARISON:  Abdomen and pelvis MRI on 05/27/2011; no prior chest CT FINDINGS: CT CHEST FINDINGS Cardiovascular: No acute findings. Mediastinum/Lymph Nodes: Mild right axillary lymphadenopathy, with largest lymph node measuring 1.3 cm in short axis. No mediastinal or hilar lymphadenopathy. Lungs/Pleura: A 9 mm ground-glass nodule is seen in the right lung apex on image 13/6. No other suspicious pulmonary nodules identified. No evidence of pulmonary infiltrate or pleural effusion. Musculoskeletal: 6 cm mass in the upper inner quadrant of right breast with central necrosis, consistent with known breast carcinoma. No suspicious bone lesions identified. CT ABDOMEN AND PELVIS FINDINGS Hepatobiliary: Moderate diffuse hepatic steatosis. A 1.9 cm low-attenuation lesion peripheral enhancement is seen dome of the right hepatic lobe on image 11/7, suspicious for liver metastasis. No other liver masses are identified. Pancreas:  No mass or inflammatory changes. Spleen:  Within normal limits in size and appearance. Adrenals/Urinary tract: Normal adrenal glands. No masses or hydronephrosis. Small left upper pole renal cyst incidentally noted. Stomach/Bowel: No evidence of obstruction, inflammatory process, or abnormal fluid collections. Normal appendix visualized. Vascular/Lymphatic: No pathologically enlarged lymph nodes identified. No abdominal aortic aneurysm. Aortic atherosclerosis. Reproductive: Prior hysterectomy noted. Adnexal regions are unremarkable in appearance. Other:  None. Musculoskeletal:  No suspicious bone  lesions identified. IMPRESSION: 6 cm right breast mass, consistent with known primary breast carcinoma. Mild right axillary lymphadenopathy, consistent with metastatic disease. 1.9 cm peripherally enhancing lesion in dome of right hepatic lobe, suspicious for liver metastasis. Moderate hepatic steatosis. 9 mm ground-glass nodule in right lung apex, with differential diagnosis including low-grade bronchogenic adenocarcinoma and inflammatory or infectious etiologies. Consider short-term followup by CT in 3 months versus further evaluation PET-CT Electronically Signed: By: Earle Gell M.D. On: 01/22/2017 13:32   Mr Breast Bilateral W Wo Contrast Inc Cad  Result Date: 01/19/2017 CLINICAL DATA:  Recently diagnosed invasive ductal carcinoma in the 1 o'clock position of the right breast, fibroadenoma in the 12 o'clock position of the right breast and fibroadenoma in the 11 o'clock position of the left breast. LABS:  None obtained on site today. EXAM: BILATERAL BREAST MRI WITH AND WITHOUT CONTRAST TECHNIQUE: Multiplanar, multisequence MR images of both breasts were obtained prior to and following the intravenous administration of 15 ml of MultiHance. THREE-DIMENSIONAL MR IMAGE RENDERING ON INDEPENDENT WORKSTATION: Three-dimensional MR images were rendered by post-processing of the original MR data on an independent workstation. The three-dimensional MR images  were interpreted, and findings are reported in the following complete MRI report for this study. Three dimensional images were evaluated at the independent DynaCad workstation COMPARISON:  Recent mammogram, ultrasound and biopsy examinations at [place]. FINDINGS: Breast composition: c. Heterogeneous fibroglandular tissue. Background parenchymal enhancement: Moderate. Right breast: Large area of mass and non mass enhancement involving all 4 quadrants of the right breast, extending from the pectoralis muscle to the nipple. This measures 10.0 x 6.9 cm in  maximum dimensions in the axial plane and 8.3 cm in maximum cephalocaudal dimension with a mixture of enhancement kinetics including persistent, plateau and rapid wash-in/washout kinetics. The underlying pectoralis muscle is thickened with no abnormal enhancement seen within the muscle. This includes a necrotic mass in the upper inner quadrant, measuring 4.5 x 3.7 cm in the axial plane with overlying skin thickening and enhancement, extending posteriorly and anteriorly. There is a linear tract extending to the skin posteriorly and this mass contains a biopsy marker clip artifact. No separate masses or abnormal enhancement suspicious for malignancy are seen in the right breast. Left breast: Biopsy marker clip artifact and adjacent enhancement at the location of the recently biopsied fibroadenoma in the 11 o'clock position of the breast. No masses or enhancement suspicious for malignancy in the left breast. Lymph nodes: There are at least 3 abnormally enlarged right axillary lymph nodes, not included in their entirety. The largest visible node is a more anterior node, measuring 1.3 cm in short axis diameter and 1.6 cm in length in the axial plane. Also demonstrated are 2 enlarged right internal mammary lymph nodes, 1 measuring 9 x 6 mm on inversion recovery image number 32 of series 4 and the other measuring 9 x 5 mm on image number 40 of series 4. Ancillary findings:  None. IMPRESSION: 1. 10.0 x 8.3 x 6.9 cm area of biopsy-proven malignancy involving all 4 quadrants of the right breast with skin involvement medially. This extends from the nipple to the pectoralis muscle with thickening of the underlying muscle with no abnormal enhancement seen within the muscle. This is involving the nipple. 2. Right axillary adenopathy compatible with metastatic adenopathy. 3. Right internal mammary adenopathy compatible with metastatic adenopathy. 4. No evidence of malignancy on the left. RECOMMENDATION: Second-look right axillary  ultrasound and biopsy of one of the abnormal appearing right axillary lymph nodes. BI-RADS CATEGORY  4: Suspicious. Electronically Signed   By: Claudie Revering M.D.   On: 01/19/2017 13:30    Labs:  CBC: Recent Labs    01/13/17 0823 01/26/17 0731  WBC 7.2 7.9  HGB 14.9 15.7*  HCT 43.9 47.3*  PLT 268 290    COAGS: Recent Labs    01/26/17 0731  INR 0.85    BMP: Recent Labs    01/13/17 0823 01/26/17 0731  NA 140 138  K 4.1 3.9  CL  --  101  CO2 27 25  GLUCOSE 110 132*  BUN 13.4 18  CALCIUM 9.2 9.0  CREATININE 0.9 0.83  GFRNONAA  --  >60  GFRAA  --  >60    LIVER FUNCTION TESTS: Recent Labs    01/13/17 0823  BILITOT 0.39  AST 24  ALT 34  ALKPHOS 87  PROT 6.9  ALBUMIN 3.7    TUMOR MARKERS: No results for input(s): AFPTM, CEA, CA199, CHROMGRNA in the last 8760 hours.  Assessment and Plan: Patient with past medical history of breast cancer with plans for upcoming .  IR consulted for Port-A-Cath placement at the request  of Dr. Lindi Adie. Patient presents today in their usual state of health.  She has been NPO and is not currently on blood thinners.  Risks and benefits discussed with the patient including, but not limited to bleeding, infection, pneumothorax, or fibrin sheath development and need for additional procedures. All of the patient's questions were answered, patient is agreeable to proceed. Consent signed and in chart.  Thank you for this interesting consult.  I greatly enjoyed meeting Kathryn [name] and look forward to participating in their care.  A copy of this report was sent to the requesting provider on this date.  Electronically Signed: Docia Barrier, PA 01/26/2017, 8:52 AM   I spent a total of  30 Minutes   in face to face in clinical consultation, greater than 50% of which was counseling/coordinating care for breast cancer.

## 2017-01-26 NOTE — Telephone Encounter (Signed)
Spoke to patient regarding upcoming January appointment updates. Patient scheduled per 1/14 los.

## 2017-01-27 ENCOUNTER — Other Ambulatory Visit: Payer: Self-pay

## 2017-01-27 ENCOUNTER — Ambulatory Visit: Payer: Self-pay | Admitting: Hematology and Oncology

## 2017-01-27 ENCOUNTER — Ambulatory Visit (HOSPITAL_COMMUNITY): Admission: RE | Admit: 2017-01-27 | Payer: Self-pay | Source: Ambulatory Visit

## 2017-01-27 ENCOUNTER — Telehealth: Payer: Self-pay

## 2017-01-27 DIAGNOSIS — C50411 Malignant neoplasm of upper-outer quadrant of right female breast: Secondary | ICD-10-CM

## 2017-01-27 DIAGNOSIS — Z171 Estrogen receptor negative status [ER-]: Principal | ICD-10-CM

## 2017-01-27 NOTE — Telephone Encounter (Signed)
Left VM to get patient scheduled for liver MR. Call back number given.  Cyndia Bent RN

## 2017-01-27 NOTE — Progress Notes (Signed)
Mr Kathryn [name]

## 2017-01-28 ENCOUNTER — Inpatient Hospital Stay: Payer: Medicaid Other

## 2017-01-28 ENCOUNTER — Encounter: Payer: Self-pay | Admitting: Hematology and Oncology

## 2017-01-28 ENCOUNTER — Encounter: Payer: Self-pay | Admitting: *Deleted

## 2017-01-28 VITALS — BP 113/73 | HR 97 | Temp 98.0°F | Resp 18

## 2017-01-28 DIAGNOSIS — Z171 Estrogen receptor negative status [ER-]: Principal | ICD-10-CM

## 2017-01-28 DIAGNOSIS — Z5111 Encounter for antineoplastic chemotherapy: Secondary | ICD-10-CM | POA: Diagnosis not present

## 2017-01-28 DIAGNOSIS — C50411 Malignant neoplasm of upper-outer quadrant of right female breast: Secondary | ICD-10-CM

## 2017-01-28 LAB — CBC WITH DIFFERENTIAL/PLATELET
Basophils Absolute: 0 10*3/uL (ref 0.0–0.1)
Basophils Relative: 1 %
EOS ABS: 0.3 10*3/uL (ref 0.0–0.5)
Eosinophils Relative: 4 %
HEMATOCRIT: 40.5 % (ref 34.8–46.6)
HEMOGLOBIN: 13.4 g/dL (ref 11.6–15.9)
LYMPHS ABS: 1.8 10*3/uL (ref 0.9–3.3)
LYMPHS PCT: 28 %
MCH: 30 pg (ref 25.1–34.0)
MCHC: 33.1 g/dL (ref 31.5–36.0)
MCV: 90.8 fL (ref 79.5–101.0)
Monocytes Absolute: 0.6 10*3/uL (ref 0.1–0.9)
Monocytes Relative: 9 %
NEUTROS ABS: 3.9 10*3/uL (ref 1.5–6.5)
NEUTROS PCT: 58 %
Platelets: 236 10*3/uL (ref 145–400)
RBC: 4.46 MIL/uL (ref 3.70–5.45)
RDW: 13.1 % (ref 11.2–16.1)
WBC: 6.5 10*3/uL (ref 3.9–10.3)

## 2017-01-28 LAB — COMPREHENSIVE METABOLIC PANEL
ALT: 27 U/L (ref 0–55)
ANION GAP: 9 (ref 3–11)
AST: 24 U/L (ref 5–34)
Albumin: 3.6 g/dL (ref 3.5–5.0)
Alkaline Phosphatase: 76 U/L (ref 40–150)
BUN: 14 mg/dL (ref 7–26)
CHLORIDE: 106 mmol/L (ref 98–109)
CO2: 26 mmol/L (ref 22–29)
Calcium: 9 mg/dL (ref 8.4–10.4)
Creatinine, Ser: 0.79 mg/dL (ref 0.60–1.10)
Glucose, Bld: 112 mg/dL (ref 70–140)
Potassium: 4.1 mmol/L (ref 3.3–4.7)
Sodium: 141 mmol/L (ref 136–145)
Total Bilirubin: 0.3 mg/dL (ref 0.2–1.2)
Total Protein: 6.6 g/dL (ref 6.4–8.3)

## 2017-01-28 MED ORDER — SODIUM CHLORIDE 0.9 % IV SOLN
Freq: Once | INTRAVENOUS | Status: AC
  Administered 2017-01-28: 10:00:00 via INTRAVENOUS

## 2017-01-28 MED ORDER — SODIUM CHLORIDE 0.9 % IV SOLN
600.0000 mg/m2 | Freq: Once | INTRAVENOUS | Status: AC
  Administered 2017-01-28: 1060 mg via INTRAVENOUS
  Filled 2017-01-28: qty 53

## 2017-01-28 MED ORDER — SODIUM CHLORIDE 0.9 % IV SOLN
Freq: Once | INTRAVENOUS | Status: AC
  Administered 2017-01-28: 11:00:00 via INTRAVENOUS
  Filled 2017-01-28: qty 5

## 2017-01-28 MED ORDER — PALONOSETRON HCL INJECTION 0.25 MG/5ML
0.2500 mg | Freq: Once | INTRAVENOUS | Status: AC
  Administered 2017-01-28: 0.25 mg via INTRAVENOUS

## 2017-01-28 MED ORDER — HEPARIN SOD (PORK) LOCK FLUSH 100 UNIT/ML IV SOLN
500.0000 [IU] | Freq: Once | INTRAVENOUS | Status: AC | PRN
  Administered 2017-01-28: 500 [IU]
  Filled 2017-01-28: qty 5

## 2017-01-28 MED ORDER — SODIUM CHLORIDE 0.9% FLUSH
10.0000 mL | INTRAVENOUS | Status: DC | PRN
  Administered 2017-01-28: 10 mL
  Filled 2017-01-28: qty 10

## 2017-01-28 MED ORDER — PEGFILGRASTIM 6 MG/0.6ML ~~LOC~~ PSKT
6.0000 mg | PREFILLED_SYRINGE | Freq: Once | SUBCUTANEOUS | Status: AC
  Administered 2017-01-28: 6 mg via SUBCUTANEOUS

## 2017-01-28 MED ORDER — DOXORUBICIN HCL CHEMO IV INJECTION 2 MG/ML
60.0000 mg/m2 | Freq: Once | INTRAVENOUS | Status: AC
  Administered 2017-01-28: 106 mg via INTRAVENOUS
  Filled 2017-01-28: qty 53

## 2017-01-28 NOTE — Progress Notes (Signed)
Met with patient regarding financial assistance.  Gave patient an application for ACS as she mentioned interest in wigs, Cancer Care with the number to call for their application and the [employer]. Patient states she is living off of her savings and it is dwendling down and not able to work more than a couple of hours on Sunday. Patient has my contact information for any additional financial questions or concerns.  Patient will bring proof of income on 02/04/17 for the [place] inf forms of bank statement and most recent paystub.

## 2017-01-28 NOTE — Patient Instructions (Signed)
[facility] Discharge Instructions for Patients Receiving Chemotherapy  Today you received the following chemotherapy agents Adriamycin and Cytoxan  To help prevent nausea and vomiting after your treatment, we encourage you to take your nausea medication as directed   If you develop nausea and vomiting that is not controlled by your nausea medication, call the clinic.   BELOW ARE SYMPTOMS THAT SHOULD BE REPORTED IMMEDIATELY:  *FEVER GREATER THAN 100.5 F  *CHILLS WITH OR WITHOUT FEVER  NAUSEA AND VOMITING THAT IS NOT CONTROLLED WITH YOUR NAUSEA MEDICATION  *UNUSUAL SHORTNESS OF BREATH  *UNUSUAL BRUISING OR BLEEDING  TENDERNESS IN MOUTH AND THROAT WITH OR WITHOUT PRESENCE OF ULCERS  *URINARY PROBLEMS  *BOWEL PROBLEMS  UNUSUAL RASH Items with * indicate a potential emergency and should be followed up as soon as possible.  Feel free to call the clinic should you have any questions or concerns. The clinic phone number is [phone].  Please show the [facility] at check-in to the Emergency Department and triage nurse.   Doxycycline injection (Adriamycin) What is this medicine? DOXYCYCLINE (dox i SYE kleen) is a tetracycline antibiotic. It kills certain bacteria or stops their growth. It is used to treat many kinds of infections, like skin, stomach, respiratory, and urinary tract infections. It also treats Lyme disease and certain sexually transmitted infections. This medicine may be used for other purposes; ask your health care provider or pharmacist if you have questions. COMMON BRAND NAME(S): Doxy 100 What should I tell my health care provider before I take this medicine? They need to know if you have any of these conditions: -bowel disease like colitis -liver disease -Loree exposure to sunlight like working outdoors -an unusual or allergic reaction to doxycycline, tetracycline antibiotics, other medicines, foods, dyes, or preservatives -pregnant or  trying to get pregnant -breast-feeding How should I use this medicine? This medicine is for infusion into a vein. It is given in a hospital or clinic setting by a health care professional. Talk to your pediatrician regarding the use of this medicine in children. While this drug may be prescribed for selected conditions, precautions do apply. Overdosage: If you think you have taken too much of this medicine contact a poison control center or emergency room at once. NOTE: This medicine is only for you. Do not share this medicine with others. What if I miss a dose? This does not apply. What may interact with this medicine? -barbiturates -birth control pills -carbamazepine -methoxyflurane -other antibiotics -phenytoin -warfarin This list may not describe all possible interactions. Give your health care provider a list of all the medicines, herbs, non-prescription drugs, or dietary supplements you use. Also tell them if you smoke, drink alcohol, or use illegal drugs. Some items may interact with your medicine. What should I watch for while using this medicine? Tell your doctor or health care professional if your symptoms do not improve. Do not treat diarrhea with over the counter products. Contact your doctor if you have diarrhea that lasts more than 2 days or if it is severe and watery. This medicine can make you more sensitive to the sun. Keep out of the sun. If you cannot avoid being in the sun, wear protective clothing and use sunscreen. Do not use sun lamps or tanning beds/booths. If you are being treated for a sexually transmitted infection, avoid sexual contact until you have finished your treatment. Your sexual partner may also need treatment. Birth control pills may not work properly while you are taking  this medicine. Talk to your doctor about using an extra method of birth control. What side effects may I notice from receiving this medicine? Side effects that you should report to your  doctor or health care professional as soon as possible: -allergic reactions like skin rash, itching or hives, swelling of the face, lips, or tongue -difficulty breathing -fever -itching in the rectal or genital area -pain on swallowing -redness, blistering, peeling or loosening of the skin, including inside the mouth -severe stomach pain or cramps -unusual bleeding or bruising -unusually weak or tired -yellowing of the eyes or skin Side effects that usually do not require medical attention (report to your doctor or health care professional if they continue or are bothersome): -diarrhea -loss of appetite -nausea, vomiting This list may not describe all possible side effects. Call your doctor for medical advice about side effects. You may report side effects to FDA at 1-800-FDA-1088. Where should I keep my medicine? This does not apply. You will only receive this medicine in a hospital or clinic setting. NOTE: This sheet is a summary. It may not cover all possible information. If you have questions about this medicine, talk to your doctor, pharmacist, or health care provider.  2018 Elsevier/Gold Standard (2015-01-30 17:14:03)   Cyclophosphamide injection (Cytoxan) What is this medicine? CYCLOPHOSPHAMIDE (sye kloe FOSS fa mide) is a chemotherapy drug. It slows the growth of cancer cells. This medicine is used to treat many types of cancer like lymphoma, myeloma, leukemia, breast cancer, and ovarian cancer, to name a few. This medicine may be used for other purposes; ask your health care provider or pharmacist if you have questions. COMMON BRAND NAME(S): Cytoxan, Neosar What should I tell my health care provider before I take this medicine? They need to know if you have any of these conditions: -blood disorders -history of other chemotherapy -infection -kidney disease -liver disease -recent or ongoing radiation therapy -tumors in the bone marrow -an unusual or allergic reaction to  cyclophosphamide, other chemotherapy, other medicines, foods, dyes, or preservatives -pregnant or trying to get pregnant -breast-feeding How should I use this medicine? This drug is usually given as an injection into a vein or muscle or by infusion into a vein. It is administered in a hospital or clinic by a specially trained health care professional. Talk to your pediatrician regarding the use of this medicine in children. Special care may be needed. Overdosage: If you think you have taken too much of this medicine contact a poison control center or emergency room at once. NOTE: This medicine is only for you. Do not share this medicine with others. What if I miss a dose? It is important not to miss your dose. Call your doctor or health care professional if you are unable to keep an appointment. What may interact with this medicine? This medicine may interact with the following medications: -amiodarone -amphotericin B -azathioprine -certain antiviral medicines for HIV or AIDS such as protease inhibitors (e.g., indinavir, ritonavir) and zidovudine -certain blood pressure medications such as benazepril, captopril, enalapril, fosinopril, lisinopril, moexipril, monopril, perindopril, quinapril, ramipril, trandolapril -certain cancer medications such as anthracyclines (e.g., daunorubicin, doxorubicin), busulfan, cytarabine, paclitaxel, pentostatin, tamoxifen, trastuzumab -certain diuretics such as chlorothiazide, chlorthalidone, hydrochlorothiazide, indapamide, metolazone -certain medicines that treat or prevent blood clots like warfarin -certain muscle relaxants such as succinylcholine -cyclosporine -etanercept -indomethacin -medicines to increase blood counts like filgrastim, pegfilgrastim, sargramostim -medicines used as general anesthesia -metronidazole -natalizumab This list may not describe all possible interactions. Give your health care provider  a list of all the medicines, herbs,  non-prescription drugs, or dietary supplements you use. Also tell them if you smoke, drink alcohol, or use illegal drugs. Some items may interact with your medicine. What should I watch for while using this medicine? Visit your doctor for checks on your progress. This drug may make you feel generally unwell. This is not uncommon, as chemotherapy can affect healthy cells as well as cancer cells. Report any side effects. Continue your course of treatment even though you feel ill unless your doctor tells you to stop. Drink water or other fluids as directed. Urinate often, even at night. In some cases, you may be given additional medicines to help with side effects. Follow all directions for their use. Call your doctor or health care professional for advice if you get a fever, chills or sore throat, or other symptoms of a cold or flu. Do not treat yourself. This drug decreases your body's ability to fight infections. Try to avoid being around people who are sick. This medicine may increase your risk to bruise or bleed. Call your doctor or health care professional if you notice any unusual bleeding. Be careful brushing and flossing your teeth or using a toothpick because you may get an infection or bleed more easily. If you have any dental work done, tell your dentist you are receiving this medicine. You may get drowsy or dizzy. Do not drive, use machinery, or do anything that needs mental alertness until you know how this medicine affects you. Do not become pregnant while taking this medicine or for 1 year after stopping it. Women should inform their doctor if they wish to become pregnant or think they might be pregnant. Men should not father a child while taking this medicine and for 4 months after stopping it. There is a potential for serious side effects to an unborn child. Talk to your health care professional or pharmacist for more information. Do not breast-feed an infant while taking this medicine. This  medicine may interfere with the ability to have a child. This medicine has caused ovarian failure in some women. This medicine has caused reduced sperm counts in some men. You should talk with your doctor or health care professional if you are concerned about your fertility. If you are going to have surgery, tell your doctor or health care professional that you have taken this medicine. What side effects may I notice from receiving this medicine? Side effects that you should report to your doctor or health care professional as soon as possible: -allergic reactions like skin rash, itching or hives, swelling of the face, lips, or tongue -low blood counts - this medicine may decrease the number of white blood cells, red blood cells and platelets. You may be at increased risk for infections and bleeding. -signs of infection - fever or chills, cough, sore throat, pain or difficulty passing urine -signs of decreased platelets or bleeding - bruising, pinpoint red spots on the skin, black, tarry stools, blood in the urine -signs of decreased red blood cells - unusually weak or tired, fainting spells, lightheadedness -breathing problems -dark urine -dizziness -palpitations -swelling of the ankles, feet, hands -trouble passing urine or change in the amount of urine -weight gain -yellowing of the eyes or skin Side effects that usually do not require medical attention (report to your doctor or health care professional if they continue or are bothersome): -changes in nail or skin color -hair loss -missed menstrual periods -mouth sores -nausea, vomiting This list  may not describe all possible side effects. Call your doctor for medical advice about side effects. You may report side effects to FDA at 1-800-FDA-1088. Where should I keep my medicine? This drug is given in a hospital or clinic and will not be stored at home. NOTE: This sheet is a summary. It may not cover all possible information. If you have  questions about this medicine, talk to your doctor, pharmacist, or health care provider.  2018 Elsevier/Gold Standard (2011-11-13 16:22:58)

## 2017-02-02 ENCOUNTER — Telehealth: Payer: Self-pay

## 2017-02-02 NOTE — Telephone Encounter (Signed)
Returning pt vm regarding MRI tomorrow. Called lvm with pt to confirm her MRI liver tomorrow and her appt for chemo on 1/24. LVM with call back number and told pt to call and confirm receipt of vm.

## 2017-02-03 ENCOUNTER — Ambulatory Visit (HOSPITAL_COMMUNITY)
Admission: RE | Admit: 2017-02-03 | Discharge: 2017-02-03 | Disposition: A | Discharge status: Home / Self Care | Payer: Medicaid Other | Source: Ambulatory Visit | Attending: Hematology and Oncology | Admitting: Hematology and Oncology

## 2017-02-03 ENCOUNTER — Encounter: Payer: Self-pay | Admitting: Pharmacy Technician

## 2017-02-03 ENCOUNTER — Telehealth: Payer: Self-pay

## 2017-02-03 DIAGNOSIS — K769 Liver disease, unspecified: Secondary | ICD-10-CM | POA: Diagnosis not present

## 2017-02-03 DIAGNOSIS — N281 Cyst of kidney, acquired: Secondary | ICD-10-CM | POA: Diagnosis not present

## 2017-02-03 DIAGNOSIS — Z171 Estrogen receptor negative status [ER-]: Secondary | ICD-10-CM | POA: Diagnosis not present

## 2017-02-03 DIAGNOSIS — C50411 Malignant neoplasm of upper-outer quadrant of right female breast: Secondary | ICD-10-CM | POA: Diagnosis present

## 2017-02-03 MED ORDER — GADOBENATE DIMEGLUMINE 529 MG/ML IV SOLN
15.0000 mL | Freq: Once | INTRAVENOUS | Status: AC | PRN
  Administered 2017-02-03: 15 mL via INTRAVENOUS

## 2017-02-03 MED ORDER — GADOBENATE DIMEGLUMINE 529 MG/ML IV SOLN: 15.0000 mL | Freq: Once | INTRAVENOUS | Status: DC | PRN

## 2017-02-03 NOTE — Telephone Encounter (Signed)
Chemo follow up call:  Spoke with pt regarding her symptoms after chemo. Pt reports that she had chills with no fever 2 days post chemo and she had taken the steroid at home once, but did not like how it made her feel. She denies any nausea, vomiting, fevers,sob,cp,constipation or fatigue.   Pt has good appetite and wanted information on when she will get results for her MRI. Told pt that she has an appt with Dr.Gudena tomorrow to discuss her lab results and MRI. Confirmed time and date with pt. No further needs at this time.

## 2017-02-03 NOTE — Assessment & Plan Note (Signed)
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  T3N0 stage IIIb  01/19/2017:Right breast 10 x 8.3 x 6.9 cm area of cancer involving all 4 quadrants with skin involvement extends from nipple to pectoralis muscle and involving right axillary lymph nodes, right internal mammary nodes  01/22/2017: CT CAP: 1.9 cm dome of the right hepatic lobe suspicious for liver metastases, 9 mm groundglass nodule in the right lung apex could be inflammation versus cancer Liver MRI: Lesion is benign involuted cyst  Radiology discussion: I discussed with the patient the results of the scans. Plan 1. neoadj chemo with dose dense AC foll by Taxol 2. Mastectomy 3. XRT ----------------------------------------------------------------------------------------------------------- Current treatment: Cycle 1 day 1 dose dense Adriamycin Cytoxan Antiemetics were reviewed Chemotherapy consent obtained Chemotherapy education completed Echocardiogram 01/18/17: EF 65-70% Closely monitoring for chemotherapy toxicities. Return to clinic in one week for toxicity check

## 2017-02-03 NOTE — Progress Notes (Signed)
The patient is approved for drug assistance by [employer] for Neulasta. The enrollment period is from 02/02/17 - 02/02/18 and is based on Self Pay. Drug replacement will begin DOS 01/28/17.

## 2017-02-04 ENCOUNTER — Encounter: Payer: Self-pay | Admitting: Hematology and Oncology

## 2017-02-04 ENCOUNTER — Inpatient Hospital Stay (HOSPITAL_BASED_OUTPATIENT_CLINIC_OR_DEPARTMENT_OTHER): Payer: Medicaid Other | Admitting: Hematology and Oncology

## 2017-02-04 ENCOUNTER — Inpatient Hospital Stay: Payer: Medicaid Other

## 2017-02-04 ENCOUNTER — Encounter: Payer: Self-pay | Admitting: *Deleted

## 2017-02-04 ENCOUNTER — Encounter: Payer: Self-pay | Admitting: Pharmacy Technician

## 2017-02-04 DIAGNOSIS — Z79899 Other long term (current) drug therapy: Secondary | ICD-10-CM

## 2017-02-04 DIAGNOSIS — K7689 Other specified diseases of liver: Secondary | ICD-10-CM

## 2017-02-04 DIAGNOSIS — C50411 Malignant neoplasm of upper-outer quadrant of right female breast: Secondary | ICD-10-CM

## 2017-02-04 DIAGNOSIS — Z171 Estrogen receptor negative status [ER-]: Principal | ICD-10-CM

## 2017-02-04 DIAGNOSIS — R21 Rash and other nonspecific skin eruption: Secondary | ICD-10-CM

## 2017-02-04 DIAGNOSIS — Z006 Encounter for examination for normal comparison and control in clinical research program: Secondary | ICD-10-CM

## 2017-02-04 DIAGNOSIS — Z5111 Encounter for antineoplastic chemotherapy: Secondary | ICD-10-CM | POA: Diagnosis not present

## 2017-02-04 DIAGNOSIS — R5383 Other fatigue: Secondary | ICD-10-CM | POA: Diagnosis not present

## 2017-02-04 LAB — CBC WITH DIFFERENTIAL/PLATELET
Basophils Absolute: 0 10*3/uL (ref 0.0–0.1)
Basophils Relative: 1 %
EOS ABS: 0.2 10*3/uL (ref 0.0–0.5)
EOS PCT: 22 %
HCT: 37.2 % (ref 34.8–46.6)
Hemoglobin: 12.4 g/dL (ref 11.6–15.9)
Lymphocytes Relative: 49 %
Lymphs Abs: 0.4 10*3/uL — ABNORMAL LOW (ref 0.9–3.3)
MCH: 30 pg (ref 25.1–34.0)
MCHC: 33.3 g/dL (ref 31.5–36.0)
MCV: 90.1 fL (ref 79.5–101.0)
Monocytes Absolute: 0.1 10*3/uL (ref 0.1–0.9)
Monocytes Relative: 14 %
Neutro Abs: 0.1 10*3/uL — CL (ref 1.5–6.5)
Neutrophils Relative %: 14 %
PLATELETS: 125 10*3/uL — AB (ref 145–400)
RBC: 4.13 MIL/uL (ref 3.70–5.45)
RDW: 12.7 % (ref 11.2–16.1)
WBC: 0.8 10*3/uL — AB (ref 3.9–10.3)

## 2017-02-04 LAB — COMPREHENSIVE METABOLIC PANEL
ALT: 20 U/L (ref 0–55)
AST: 13 U/L (ref 5–34)
Albumin: 3.2 g/dL — ABNORMAL LOW (ref 3.5–5.0)
Alkaline Phosphatase: 75 U/L (ref 40–150)
Anion gap: 10 (ref 3–11)
BUN: 12 mg/dL (ref 7–26)
CHLORIDE: 102 mmol/L (ref 98–109)
CO2: 26 mmol/L (ref 22–29)
CREATININE: 0.75 mg/dL (ref 0.60–1.10)
Calcium: 9.1 mg/dL (ref 8.4–10.4)
GFR calc non Af Amer: >60 mL/min (ref >60)
Glucose, Bld: 169 mg/dL — ABNORMAL HIGH (ref 70–140)
Potassium: 3.7 mmol/L (ref 3.3–4.7)
SODIUM: 138 mmol/L (ref 136–145)
Total Bilirubin: 0.4 mg/dL (ref 0.2–1.2)
Total Protein: 6.2 g/dL — ABNORMAL LOW (ref 6.4–8.3)

## 2017-02-04 NOTE — Progress Notes (Signed)
Met with patient whom brought proof of her income for the [employer] grant. ° °Approved patient based on income received but patient living off of savings due to not being able to work now. Patient has a copy of the approval letter as well as the expense sheet it covers along with the outpatient pharmacy information. Explained to patient in detail how expenses are paid.  ° °Patient given my card for any additional financial questions or concerns.  °

## 2017-02-04 NOTE — Progress Notes (Signed)
Patient Care Team: [name], MD as PCP - General (Family Medicine)  DIAGNOSIS:  Encounter Diagnosis  Name Primary?  . 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  T3N0 stage IIIb      01/19/2017 Breast MRI    Right breast 10 x 8.3 x 6.9 cm area of cancer involving all 4 quadrants with skin involvement extends from nipple to pectoralis muscle and involving right axillary lymph nodes, right internal mammary nodes      01/22/2017 Imaging    CT chest abdomen pelvis reveals a 6 cm right breast mass, mild right axillary lymphadenopathy, 1.9 cm peripheral enhancing lesion in the dome of the right hepatic lobe, 9 mm groundglass nodule right lung apex      01/28/2017 -  Neo-Adjuvant Chemotherapy    Dose dense Adriamycin and Cytoxan  followed with Taxol and carboplatin       CHIEF COMPLIANT: Cycle 1 day 8 Adriamycin and Cytoxan  INTERVAL HISTORY: Kathryn [name] is a 13-year with above-mentioned history of right breast cancer who is currently undergoing neoadjuvant chemotherapy with dose dense Adriamycin and Cytoxan.  Today is cycle 1 day 8.  Tolerated cycle 1 fairly well.  She did not have any nausea vomiting.  She felt weird with the Decadron and would not like to take that anymore.  She does complain of fatigue as a result of chemotherapy.  REVIEW OF SYSTEMS:   Constitutional: Denies fevers, chills or abnormal weight loss Eyes: Denies blurriness of vision Ears,  nose, mouth, throat, and face: Denies mucositis or sore throat Respiratory: Denies cough, dyspnea or wheezes Cardiovascular: Denies palpitation, chest discomfort Gastrointestinal:  Denies nausea, heartburn or change in bowel habits Skin: Denies abnormal skin rashes Lymphatics: Denies new lymphadenopathy or easy bruising Neurological:Denies numbness, tingling or new weaknesses Behavioral/Psych: Mood is stable, no new changes  Extremities: No lower extremity edema Breast: No breast lumps are palpable All other systems were reviewed with the patient and are negative.  I have reviewed the past medical history, past surgical history, social history and family history with the patient and they are unchanged from previous note.  ALLERGIES:  has No Known Allergies.  MEDICATIONS:  Current Outpatient Medications  Medication Sig Dispense Refill  . cholecalciferol (VITAMIN D) 1000 UNITS tablet Take 1,000 Units by mouth daily.    . clonazePAM (KLONOPIN) 0.5 MG tablet Take 1 tablet (0.5 mg total) by mouth 3 (three) times daily as needed for anxiety. 90 tablet 5  . cyclobenzaprine (FLEXERIL) 10 MG tablet Take 10 mg by mouth 3 (three) times daily as needed for muscle spasms.    Marland Kitchen lidocaine-prilocaine (EMLA) cream Apply to affected area once (Patient taking differently: Apply 1 application topically 3 times/day as needed-between meals & bedtime. Apply to affected area once) 30 g 3  . LORazepam (ATIVAN) 0.5 MG tablet Take 1 tablet (0.5 mg total) by mouth at bedtime as needed (Nausea or vomiting). 30 tablet 0  . Misc Natural Products (ACAI+SUPERFRUIT/GREEN TEA) TABS Take  1 tablet by mouth daily.    . Multiple Vitamin (MULITIVITAMIN WITH MINERALS) TABS Take 1 tablet by mouth daily. Plus vit C '500mg'$     . naproxen (NAPROSYN) 500 MG tablet Take 500 mg by mouth every 12 (twelve) hours as needed.    . ondansetron (ZOFRAN) 8 MG tablet Take 1 tablet (8 mg total) by mouth 2 (two) times daily as needed. Start on the  third day after chemotherapy. 30 tablet 1  . prochlorperazine (COMPAZINE) 10 MG tablet Take 1 tablet (10 mg total) by mouth every 6 (six) hours as needed (Nausea or vomiting). 30 tablet 1   No current facility-administered medications for this visit.     PHYSICAL EXAMINATION: ECOG PERFORMANCE STATUS: 1 - Symptomatic but completely ambulatory  Vitals:   02/04/17 0935  BP: 119/78  Pulse: (!) 120  Resp: 18  Temp: (!) 97.4 F (36.3 C)  SpO2: 100%   Filed Weights   02/04/17 0935  Weight: 158 lb 14.4 oz (72.1 kg)    GENERAL:alert, no distress and comfortable SKIN: skin color, texture, turgor are normal, no rashes or significant lesions EYES: normal, Conjunctiva are pink and non-injected, sclera clear OROPHARYNX:no exudate, no erythema and lips, buccal mucosa, and tongue normal  NECK: supple, thyroid normal size, non-tender, without nodularity LYMPH:  no palpable lymphadenopathy in the cervical, axillary or inguinal LUNGS: clear to auscultation and percussion with normal breathing effort HEART: regular rate & rhythm and no murmurs and no lower extremity edema ABDOMEN:abdomen soft, non-tender and normal bowel sounds MUSCULOSKELETAL:no cyanosis of digits and no clubbing  NEURO: alert & oriented x 3 with fluent speech, no focal motor/sensory deficits EXTREMITIES: No lower extremity edema  LABORATORY DATA:  I have reviewed the data as listed CMP Latest Ref Rng & Units 01/28/2017 01/26/2017 01/13/2017  Glucose 70 - 140 mg/dL 112 132(H) 110  BUN 7 - 26 mg/dL 14 18 13.4  Creatinine 0.60 - 1.10 mg/dL 0.79 0.83 0.9  Sodium 136 - 145 mmol/L 141 138 140  Potassium 3.3 - 4.7 mmol/L 4.1 3.9 4.1  Chloride 98 - 109 mmol/L 106 101 -  CO2 22 - 29 mmol/L '26 25 27  '$ Calcium 8.4 - 10.4 mg/dL 9.0 9.0 9.2  Total Protein 6.4 - 8.3 g/dL 6.6 - 6.9  Total Bilirubin 0.2 - 1.2 mg/dL 0.3 - 0.39  Alkaline Phos 40 - 150 U/L 76 - 87  AST 5 - 34 U/L 24 - 24  ALT 0 - 55 U/L 27 - 34    Lab Results  Component  Value Date   WBC 6.5 01/28/2017   HGB 13.4 01/28/2017   HCT 40.5 01/28/2017   MCV 90.8 01/28/2017   PLT 236 01/28/2017   NEUTROABS 3.9 01/28/2017    ASSESSMENT & PLAN:  Malignant neoplasm of upper-outer quadrant of right breast in female, estrogen receptor negative ([place]) 12/29/2016: Palpable right breast lump for 6 months, mammogram revealed 6 cm mass at 1 o'clock position 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  T3N0 stage IIIb  01/19/2017:Right breast 10 x 8.3 x 6.9 cm area of cancer involving all 4 quadrants with skin involvement extends from nipple to pectoralis muscle and involving right axillary lymph nodes, right internal mammary nodes  01/22/2017: CT CAP: 1.9 cm dome of the right hepatic lobe suspicious for liver metastases, 9 mm groundglass nodule in the right  lung apex could be inflammation versus cancer Liver MRI: Lesion is benign involuted cyst  Radiology discussion: I discussed with the patient the results of the scans. Plan 1. neoadj chemo with dose dense AC foll by Taxol and carboplatin 2. Mastectomy 3. XRT ----------------------------------------------------------------------------------------------------------- Current treatment: Cycle 1 day 8 dose dense Adriamycin Cytoxan Echocardiogram 01/18/17: EF 65-70% Chemo toxicities: 1.  Fatigue 2. could not tolerate dexamethasone 3. Maculopapular skin rash on the chest 4.  ANC 0.1 account: I decreased the dosage with cycle 2  Closely monitoring for chemotherapy toxicities. Return to clinic in one week for cycle 2  I spent 25 minutes talking to the patient of which more than half was spent in counseling and coordination of care.  No orders of the defined types were placed in this encounter.  The patient has a good understanding of the overall plan. she agrees  with it. she will call with any problems that may develop before the next visit here.   Harriette Ohara, MD 02/04/17

## 2017-02-04 NOTE — Progress Notes (Signed)
The patient is approved for drug assistance by [employer] for [employer]. Enrollment is from 02/03/17 - 02/03/18 and is based on Self Pay.  Assistance begins on DOS 01/28/17.

## 2017-02-05 ENCOUNTER — Telehealth: Payer: Self-pay | Admitting: Hematology and Oncology

## 2017-02-05 NOTE — Telephone Encounter (Signed)
No 1/24 los.  

## 2017-02-08 ENCOUNTER — Encounter (HOSPITAL_COMMUNITY): Payer: Self-pay | Admitting: *Deleted

## 2017-02-08 ENCOUNTER — Encounter: Payer: Self-pay | Admitting: Pharmacy Technician

## 2017-02-08 NOTE — Progress Notes (Signed)
The patient is approved for Medicaid through [place].  Coverage is from 12/12/16 - 12/11/17. No drug has been requested through the drug assistance program.

## 2017-02-10 NOTE — Assessment & Plan Note (Signed)
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  T3N0 stage IIIb  01/19/2017:Right breast 10 x 8.3 x 6.9 cm area of cancer involving all 4 quadrants with skin involvement extends from nipple to pectoralis muscle and involving right axillary lymph nodes, right internal mammary nodes  01/22/2017: CT CAP: 1.9 cm dome of the right hepatic lobe suspicious for liver metastases, 9 mm groundglass nodule in the right lung apex could be inflammation versus cancer Liver MRI: Lesion is benign involuted cyst  Radiology discussion: I discussed with the patient the results of the scans. Plan 1. neoadj chemo with dose dense AC foll by Taxol and carboplatin 2. Mastectomy 3. XRT ----------------------------------------------------------------------------------------------------------- Current treatment: Cycle 2 day 1 dose dense Adriamycin Cytoxan Echocardiogram 01/18/17: EF 65-70% Chemo toxicities: 1.  Fatigue 2. could not tolerate dexamethasone 3. Maculopapular skin rash on the chest 4.  ANC 0.1 account: I decreased the dosage with cycle 2  Closely monitoring for chemotherapy toxicities. Return to clinic in 2s week for cycle 3

## 2017-02-11 ENCOUNTER — Inpatient Hospital Stay: Payer: Medicaid Other

## 2017-02-11 ENCOUNTER — Encounter: Payer: Self-pay | Admitting: *Deleted

## 2017-02-11 ENCOUNTER — Inpatient Hospital Stay (HOSPITAL_BASED_OUTPATIENT_CLINIC_OR_DEPARTMENT_OTHER): Payer: Medicaid Other | Admitting: Hematology and Oncology

## 2017-02-11 ENCOUNTER — Other Ambulatory Visit: Payer: Self-pay

## 2017-02-11 VITALS — HR 108

## 2017-02-11 DIAGNOSIS — Z171 Estrogen receptor negative status [ER-]: Principal | ICD-10-CM

## 2017-02-11 DIAGNOSIS — C50411 Malignant neoplasm of upper-outer quadrant of right female breast: Secondary | ICD-10-CM

## 2017-02-11 DIAGNOSIS — K7689 Other specified diseases of liver: Secondary | ICD-10-CM | POA: Diagnosis not present

## 2017-02-11 DIAGNOSIS — R5383 Other fatigue: Secondary | ICD-10-CM | POA: Diagnosis not present

## 2017-02-11 DIAGNOSIS — R21 Rash and other nonspecific skin eruption: Secondary | ICD-10-CM

## 2017-02-11 DIAGNOSIS — Z95828 Presence of other vascular implants and grafts: Secondary | ICD-10-CM | POA: Insufficient documentation

## 2017-02-11 DIAGNOSIS — Z006 Encounter for examination for normal comparison and control in clinical research program: Secondary | ICD-10-CM | POA: Diagnosis not present

## 2017-02-11 DIAGNOSIS — Z79899 Other long term (current) drug therapy: Secondary | ICD-10-CM | POA: Diagnosis not present

## 2017-02-11 DIAGNOSIS — Z5111 Encounter for antineoplastic chemotherapy: Secondary | ICD-10-CM | POA: Diagnosis not present

## 2017-02-11 LAB — COMPREHENSIVE METABOLIC PANEL
ALBUMIN: 3.4 g/dL — AB (ref 3.5–5.0)
ALK PHOS: 109 U/L (ref 40–150)
ALT: 22 U/L (ref 0–55)
ANION GAP: 10 (ref 3–11)
AST: 17 U/L (ref 5–34)
BUN: 14 mg/dL (ref 7–26)
CALCIUM: 9 mg/dL (ref 8.4–10.4)
CO2: 24 mmol/L (ref 22–29)
Chloride: 106 mmol/L (ref 98–109)
Creatinine, Ser: 0.84 mg/dL (ref 0.60–1.10)
GFR calc non Af Amer: >60 mL/min (ref >60)
GLUCOSE: 232 mg/dL — AB (ref 70–140)
POTASSIUM: 3.8 mmol/L (ref 3.5–5.1)
Sodium: 140 mmol/L (ref 136–145)
TOTAL PROTEIN: 6.4 g/dL (ref 6.4–8.3)

## 2017-02-11 LAB — CBC WITH DIFFERENTIAL/PLATELET
BASOS ABS: 0 10*3/uL (ref 0.0–0.1)
BASOS PCT: 0 %
Eosinophils Absolute: 0.1 10*3/uL (ref 0.0–0.5)
Eosinophils Relative: 1 %
HEMATOCRIT: 38.7 % (ref 34.8–46.6)
HEMOGLOBIN: 12.6 g/dL (ref 11.6–15.9)
LYMPHS PCT: 10 %
Lymphs Abs: 0.9 10*3/uL (ref 0.9–3.3)
MCH: 29.8 pg (ref 25.1–34.0)
MCHC: 32.6 g/dL (ref 31.5–36.0)
MCV: 91.5 fL (ref 79.5–101.0)
MONO ABS: 0.5 10*3/uL (ref 0.1–0.9)
Monocytes Relative: 6 %
NEUTROS ABS: 7.7 10*3/uL — AB (ref 1.5–6.5)
NEUTROS PCT: 83 %
Platelets: 167 10*3/uL (ref 145–400)
RBC: 4.23 MIL/uL (ref 3.70–5.45)
RDW: 13.5 % (ref 11.2–14.5)
WBC: 9.4 10*3/uL (ref 3.9–10.3)

## 2017-02-11 MED ORDER — PALONOSETRON HCL INJECTION 0.25 MG/5ML
0.2500 mg | Freq: Once | INTRAVENOUS | Status: AC
  Administered 2017-02-11: 0.25 mg via INTRAVENOUS

## 2017-02-11 MED ORDER — SODIUM CHLORIDE 0.9 % IV SOLN
Freq: Once | INTRAVENOUS | Status: AC
  Administered 2017-02-11: 12:00:00 via INTRAVENOUS
  Filled 2017-02-11: qty 5

## 2017-02-11 MED ORDER — PEGFILGRASTIM 6 MG/0.6ML ~~LOC~~ PSKT
6.0000 mg | PREFILLED_SYRINGE | Freq: Once | SUBCUTANEOUS | Status: AC
  Administered 2017-02-11: 6 mg via SUBCUTANEOUS

## 2017-02-11 MED ORDER — SODIUM CHLORIDE 0.9 % IV SOLN
500.0000 mg/m2 | Freq: Once | INTRAVENOUS | Status: AC
  Administered 2017-02-11: 880 mg via INTRAVENOUS
  Filled 2017-02-11: qty 44

## 2017-02-11 MED ORDER — SODIUM CHLORIDE 0.9 % IV SOLN
Freq: Once | INTRAVENOUS | Status: AC
  Administered 2017-02-11: 12:00:00 via INTRAVENOUS

## 2017-02-11 MED ORDER — SODIUM CHLORIDE 0.9% FLUSH
10.0000 mL | INTRAVENOUS | Status: DC | PRN
  Administered 2017-02-11: 10 mL via INTRAVENOUS
  Filled 2017-02-11: qty 10

## 2017-02-11 MED ORDER — DOXORUBICIN HCL CHEMO IV INJECTION 2 MG/ML
50.0000 mg/m2 | Freq: Once | INTRAVENOUS | Status: AC
  Administered 2017-02-11: 88 mg via INTRAVENOUS
  Filled 2017-02-11: qty 44

## 2017-02-11 MED ORDER — SODIUM CHLORIDE 0.9% FLUSH
10.0000 mL | INTRAVENOUS | Status: DC | PRN
  Administered 2017-02-11: 10 mL
  Filled 2017-02-11: qty 10

## 2017-02-11 MED ORDER — HEPARIN SOD (PORK) LOCK FLUSH 100 UNIT/ML IV SOLN
500.0000 [IU] | Freq: Once | INTRAVENOUS | Status: AC | PRN
  Administered 2017-02-11: 500 [IU]
  Filled 2017-02-11: qty 5

## 2017-02-11 NOTE — Progress Notes (Signed)
Patient Care Team: [name], MD as PCP - General (Family Medicine)  DIAGNOSIS:  Encounter Diagnosis  Name Primary?  . 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  T3N0 stage IIIb      01/19/2017 Breast MRI    Right breast 10 x 8.3 x 6.9 cm area of cancer involving all 4 quadrants with skin involvement extends from nipple to pectoralis muscle and involving right axillary lymph nodes, right internal mammary nodes      01/22/2017 Imaging    CT chest abdomen pelvis reveals a 6 cm right breast mass, mild right axillary lymphadenopathy, 1.9 cm peripheral enhancing lesion in the dome of the right hepatic lobe, 9 mm groundglass nodule right lung apex      01/28/2017 -  Neo-Adjuvant Chemotherapy    Dose dense Adriamycin and Cytoxan  followed with Taxol and carboplatin       CHIEF COMPLIANT: Cycle 2 dose dense Adriamycin and Cytoxan  INTERVAL HISTORY: Kathryn [name] is a 57 year old with above-mentioned history of right breast cancer currently on neoadjuvant chemotherapy and today is cycle 2 of dose dense Adriamycin and Cytoxan.  She is recovered from the side effects of chemotherapy from 2 weeks ago.  She does not have any further nausea vomiting issues.  Taste and appetite have come back.  She also had maculopapular skin rash in the chest which appears to be improving.  REVIEW OF SYSTEMS:   Constitutional: Denies fevers, chills or abnormal weight loss Eyes: Denies  blurriness of vision Ears, nose, mouth, throat, and face: Denies mucositis or sore throat Respiratory: Denies cough, dyspnea or wheezes Cardiovascular: Denies palpitation, chest discomfort Gastrointestinal:  Denies nausea, heartburn or change in bowel habits Skin: Maculopapular skin rashes Lymphatics: Denies new lymphadenopathy or easy bruising Neurological:Denies numbness, tingling or new weaknesses Behavioral/Psych: Mood is stable, no new changes  Extremities: No lower extremity edema  All other systems were reviewed with the patient and are negative.  I have reviewed the past medical history, past surgical history, social history and family history with the patient and they are unchanged from previous note.  ALLERGIES:  has No Known Allergies.  MEDICATIONS:  Current Outpatient Medications  Medication Sig Dispense Refill  . cholecalciferol (VITAMIN D) 1000 UNITS tablet Take 1,000 Units by mouth daily.    . clonazePAM (KLONOPIN) 0.5 MG tablet Take 1 tablet (0.5 mg total) by mouth 3 (three) times daily as needed for anxiety. 90 tablet 5  . cyclobenzaprine (FLEXERIL) 10 MG tablet Take 10 mg by mouth 3 (three) times daily as needed for muscle spasms.    Marland Kitchen lidocaine-prilocaine (EMLA) cream Apply to affected area once (Patient taking differently: Apply 1 application topically 3 times/day as needed-between meals & bedtime. Apply to affected area once) 30 g 3  . LORazepam (ATIVAN) 0.5 MG tablet Take 1 tablet (0.5 mg total) by mouth at bedtime as needed (Nausea or vomiting). 30 tablet 0  . Misc Natural Products (ACAI+SUPERFRUIT/GREEN TEA) TABS Take 1 tablet by mouth  daily.    . Multiple Vitamin (MULITIVITAMIN WITH MINERALS) TABS Take 1 tablet by mouth daily. Plus vit C '500mg'$     . naproxen (NAPROSYN) 500 MG tablet Take 500 mg by mouth every 12 (twelve) hours as needed.    . ondansetron (ZOFRAN) 8 MG tablet Take 1 tablet (8 mg total) by mouth 2 (two) times daily as needed. Start on the third day  after chemotherapy. 30 tablet 1  . prochlorperazine (COMPAZINE) 10 MG tablet Take 1 tablet (10 mg total) by mouth every 6 (six) hours as needed (Nausea or vomiting). 30 tablet 1   No current facility-administered medications for this visit.     PHYSICAL EXAMINATION: ECOG PERFORMANCE STATUS: 1 - Symptomatic but completely ambulatory  Vitals:   02/11/17 1010  BP: 127/79  Pulse: (!) 120  Resp: 20  Temp: 97.7 F (36.5 C)  SpO2: 99%   Filed Weights   02/11/17 1010  Weight: 159 lb 9.6 oz (72.4 kg)    GENERAL:alert, no distress and comfortable SKIN: skin color, texture, turgor are normal, no rashes or significant lesions EYES: normal, Conjunctiva are pink and non-injected, sclera clear OROPHARYNX:no exudate, no erythema and lips, buccal mucosa, and tongue normal  NECK: supple, thyroid normal size, non-tender, without nodularity LYMPH:  no palpable lymphadenopathy in the cervical, axillary or inguinal LUNGS: clear to auscultation and percussion with normal breathing effort HEART: regular rate & rhythm and no murmurs and no lower extremity edema ABDOMEN:abdomen soft, non-tender and normal bowel sounds MUSCULOSKELETAL:no cyanosis of digits and no clubbing  NEURO: alert & oriented x 3 with fluent speech, no focal motor/sensory deficits EXTREMITIES: No lower extremity edema BREAST: No palpable masses or nodules in either right or left breasts. No palpable axillary supraclavicular or infraclavicular adenopathy no breast tenderness or nipple discharge. (exam performed in the presence of a chaperone)  LABORATORY DATA:  I have reviewed the data as listed CMP Latest Ref Rng & Units 02/11/2017 02/04/2017 01/28/2017  Glucose 70 - 140 mg/dL 232(H) 169(H) 112  BUN 7 - 26 mg/dL '14 12 14  '$ Creatinine 0.60 - 1.10 mg/dL 0.84 0.75 0.79  Sodium 136 - 145 mmol/L 140 138 141  Potassium 3.5 - 5.1 mmol/L 3.8 3.7 4.1  Chloride 98 - 109 mmol/L 106 102 106  CO2 22 - 29 mmol/L '24 26 26  '$ Calcium 8.4 - 10.4  mg/dL 9.0 9.1 9.0  Total Protein 6.4 - 8.3 g/dL 6.4 6.2(L) 6.6  Total Bilirubin 0.2 - 1.2 mg/dL <0.2(L) 0.4 0.3  Alkaline Phos 40 - 150 U/L 109 75 76  AST 5 - 34 U/L '17 13 24  '$ ALT 0 - 55 U/L '22 20 27    '$ Lab Results  Component Value Date   WBC 9.4 02/11/2017   HGB 12.6 02/11/2017   HCT 38.7 02/11/2017   MCV 91.5 02/11/2017   PLT 167 02/11/2017   NEUTROABS 7.7 (H) 02/11/2017    ASSESSMENT & PLAN:  Malignant neoplasm of upper-outer quadrant of right breast in female, estrogen receptor negative ([place]) 12/29/2016: Palpable right breast lump for 6 months, mammogram revealed 6 cm mass at 1 o'clock position 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  T3N0 stage IIIb  01/19/2017:Right breast 10 x 8.3 x 6.9 cm area of cancer involving all 4 quadrants with skin involvement extends from nipple to pectoralis muscle and involving right axillary  lymph nodes, right internal mammary nodes  01/22/2017: CT CAP: 1.9 cm dome of the right hepatic lobe suspicious for liver metastases, 9 mm groundglass nodule in the right lung apex could be inflammation versus cancer Liver MRI: Lesion is benign involuted cyst  Radiology discussion: I discussed with the patient the results of the scans. Plan 1. neoadj chemo with dose dense AC foll by Taxol and carboplatin 2. Mastectomy 3. XRT ----------------------------------------------------------------------------------------------------------- Current treatment: Cycle 2 day 1 dose dense Adriamycin Cytoxan Echocardiogram 01/18/17: EF 65-70% Chemo toxicities: 1.  Fatigue 2. could not tolerate dexamethasone: Discontinued 3. Maculopapular skin rash on the chest: Resolved 4.  ANC 0.1: I decreased the dosage with cycle 2  Closely monitoring for chemotherapy toxicities. Return to clinic in 2 weeks for cycle   3  I spent 25 minutes talking to the patient of which more than half was spent in counseling and coordination of care.  No orders of the defined types were placed in this encounter.  The patient has a good understanding of the overall plan. she agrees with it. she will call with any problems that may develop before the next visit here.   Harriette Ohara, MD 02/11/17

## 2017-02-11 NOTE — Patient Instructions (Signed)
[facility] Discharge Instructions for Patients Receiving Chemotherapy  Today you received the following chemotherapy agents Adriamycin and cytoxan  To help prevent nausea and vomiting after your treatment, we encourage you to take your nausea medication as directed   If you develop nausea and vomiting that is not controlled by your nausea medication, call the clinic.   BELOW ARE SYMPTOMS THAT SHOULD BE REPORTED IMMEDIATELY:  *FEVER GREATER THAN 100.5 F  *CHILLS WITH OR WITHOUT FEVER  NAUSEA AND VOMITING THAT IS NOT CONTROLLED WITH YOUR NAUSEA MEDICATION  *UNUSUAL SHORTNESS OF BREATH  *UNUSUAL BRUISING OR BLEEDING  TENDERNESS IN MOUTH AND THROAT WITH OR WITHOUT PRESENCE OF ULCERS  *URINARY PROBLEMS  *BOWEL PROBLEMS  UNUSUAL RASH Items with * indicate a potential emergency and should be followed up as soon as possible.  Feel free to call the clinic should you have any questions or concerns. The clinic phone number is [phone].  Please show the CHEMO ALERT CARD at check-in to the Emergency Department and triage nurse.   

## 2017-02-12 ENCOUNTER — Telehealth: Payer: Self-pay

## 2017-02-12 NOTE — Telephone Encounter (Signed)
Called Dr. Darrel Hoover office to confirm appt on Feb 5th has been canceled.  Cyndia Bent RN

## 2017-02-15 ENCOUNTER — Telehealth: Payer: Self-pay

## 2017-02-15 NOTE — Telephone Encounter (Signed)
Received VM from patient regarding she would like her 02/16/17 appt with Dr [facility] canceled, pt said it would be rescheduled at a later date.  Called pt back and per telephone notes, I let her know the appt with Dr Dalbert Batman has been cancelled. No other needs per pt at this time.

## 2017-02-25 ENCOUNTER — Encounter: Payer: Self-pay | Admitting: *Deleted

## 2017-02-25 ENCOUNTER — Inpatient Hospital Stay: Payer: Medicaid Other

## 2017-02-25 ENCOUNTER — Inpatient Hospital Stay (HOSPITAL_BASED_OUTPATIENT_CLINIC_OR_DEPARTMENT_OTHER): Payer: Medicaid Other | Admitting: Hematology and Oncology

## 2017-02-25 ENCOUNTER — Inpatient Hospital Stay: Payer: Medicaid Other | Attending: Radiation Oncology

## 2017-02-25 DIAGNOSIS — K7689 Other specified diseases of liver: Secondary | ICD-10-CM | POA: Diagnosis not present

## 2017-02-25 DIAGNOSIS — C773 Secondary and unspecified malignant neoplasm of axilla and upper limb lymph nodes: Secondary | ICD-10-CM | POA: Diagnosis not present

## 2017-02-25 DIAGNOSIS — R5383 Other fatigue: Secondary | ICD-10-CM | POA: Insufficient documentation

## 2017-02-25 DIAGNOSIS — L658 Other specified nonscarring hair loss: Secondary | ICD-10-CM | POA: Diagnosis not present

## 2017-02-25 DIAGNOSIS — C50411 Malignant neoplasm of upper-outer quadrant of right female breast: Secondary | ICD-10-CM

## 2017-02-25 DIAGNOSIS — Z5111 Encounter for antineoplastic chemotherapy: Secondary | ICD-10-CM | POA: Diagnosis present

## 2017-02-25 DIAGNOSIS — R21 Rash and other nonspecific skin eruption: Secondary | ICD-10-CM

## 2017-02-25 DIAGNOSIS — Z006 Encounter for examination for normal comparison and control in clinical research program: Secondary | ICD-10-CM | POA: Diagnosis not present

## 2017-02-25 DIAGNOSIS — Z79899 Other long term (current) drug therapy: Secondary | ICD-10-CM | POA: Insufficient documentation

## 2017-02-25 DIAGNOSIS — Z171 Estrogen receptor negative status [ER-]: Principal | ICD-10-CM

## 2017-02-25 DIAGNOSIS — Z95828 Presence of other vascular implants and grafts: Secondary | ICD-10-CM

## 2017-02-25 LAB — CBC WITH DIFFERENTIAL (CANCER CENTER ONLY)
Basophils Absolute: 0.1 10*3/uL (ref 0.0–0.1)
Basophils Relative: 1 %
EOS ABS: 0.1 10*3/uL (ref 0.0–0.5)
Eosinophils Relative: 1 %
HEMATOCRIT: 37.9 % (ref 34.8–46.6)
HEMOGLOBIN: 12.5 g/dL (ref 11.6–15.9)
LYMPHS ABS: 1.2 10*3/uL (ref 0.9–3.3)
Lymphocytes Relative: 11 %
MCH: 30.4 pg (ref 25.1–34.0)
MCHC: 33 g/dL (ref 31.5–36.0)
MCV: 92.2 fL (ref 79.5–101.0)
MONOS PCT: 6 %
Monocytes Absolute: 0.6 10*3/uL (ref 0.1–0.9)
NEUTROS ABS: 8.7 10*3/uL — AB (ref 1.5–6.5)
NEUTROS PCT: 81 %
Platelet Count: 158 10*3/uL (ref 145–400)
RBC: 4.11 MIL/uL (ref 3.70–5.45)
RDW: 14.9 % — ABNORMAL HIGH (ref 11.2–14.5)
WBC: 10.5 10*3/uL — AB (ref 3.9–10.3)

## 2017-02-25 LAB — CMP (CANCER CENTER ONLY)
ALT: 22 U/L (ref 0–55)
AST: 16 U/L (ref 5–34)
Albumin: 3.6 g/dL (ref 3.5–5.0)
Alkaline Phosphatase: 128 U/L (ref 40–150)
Anion gap: 11 (ref 3–11)
BUN: 12 mg/dL (ref 7–26)
CO2: 22 mmol/L (ref 22–29)
Calcium: 8.8 mg/dL (ref 8.4–10.4)
Chloride: 106 mmol/L (ref 98–109)
Creatinine: 0.85 mg/dL (ref 0.60–1.10)
GFR, Est AFR Am: >60 mL/min
GFR, Estimated: >60 mL/min
Glucose, Bld: 240 mg/dL — ABNORMAL HIGH (ref 70–140)
Potassium: 3.8 mmol/L (ref 3.5–5.1)
Sodium: 139 mmol/L (ref 136–145)
Total Bilirubin: 0.3 mg/dL (ref 0.2–1.2)
Total Protein: 6.5 g/dL (ref 6.4–8.3)

## 2017-02-25 MED ORDER — DOXORUBICIN HCL CHEMO IV INJECTION 2 MG/ML
50.0000 mg/m2 | Freq: Once | INTRAVENOUS | Status: AC
  Administered 2017-02-25: 88 mg via INTRAVENOUS
  Filled 2017-02-25: qty 44

## 2017-02-25 MED ORDER — SODIUM CHLORIDE 0.9 % IV SOLN
500.0000 mg/m2 | Freq: Once | INTRAVENOUS | Status: AC
  Administered 2017-02-25: 880 mg via INTRAVENOUS
  Filled 2017-02-25: qty 44

## 2017-02-25 MED ORDER — SODIUM CHLORIDE 0.9 % IV SOLN
Freq: Once | INTRAVENOUS | Status: AC
  Administered 2017-02-25: 10:00:00 via INTRAVENOUS
  Filled 2017-02-25: qty 5

## 2017-02-25 MED ORDER — SODIUM CHLORIDE 0.9% FLUSH
10.0000 mL | INTRAVENOUS | Status: DC | PRN
  Administered 2017-02-25: 10 mL
  Filled 2017-02-25: qty 10

## 2017-02-25 MED ORDER — PEGFILGRASTIM 6 MG/0.6ML ~~LOC~~ PSKT
6.0000 mg | PREFILLED_SYRINGE | Freq: Once | SUBCUTANEOUS | Status: AC
  Administered 2017-02-25: 6 mg via SUBCUTANEOUS

## 2017-02-25 MED ORDER — SODIUM CHLORIDE 0.9 % IV SOLN
Freq: Once | INTRAVENOUS | Status: AC
  Administered 2017-02-25: 10:00:00 via INTRAVENOUS

## 2017-02-25 MED ORDER — PALONOSETRON HCL INJECTION 0.25 MG/5ML
INTRAVENOUS | Status: AC
  Filled 2017-02-25: qty 5

## 2017-02-25 MED ORDER — PEGFILGRASTIM 6 MG/0.6ML ~~LOC~~ PSKT
PREFILLED_SYRINGE | SUBCUTANEOUS | Status: AC
  Filled 2017-02-25: qty 0.6

## 2017-02-25 MED ORDER — SODIUM CHLORIDE 0.9% FLUSH
10.0000 mL | INTRAVENOUS | Status: DC | PRN
  Administered 2017-02-25: 10 mL via INTRAVENOUS
  Filled 2017-02-25: qty 10

## 2017-02-25 MED ORDER — PALONOSETRON HCL INJECTION 0.25 MG/5ML
0.2500 mg | Freq: Once | INTRAVENOUS | Status: AC
  Administered 2017-02-25: 0.25 mg via INTRAVENOUS

## 2017-02-25 MED ORDER — HEPARIN SOD (PORK) LOCK FLUSH 100 UNIT/ML IV SOLN
500.0000 [IU] | Freq: Once | INTRAVENOUS | Status: AC | PRN
  Administered 2017-02-25: 500 [IU]
  Filled 2017-02-25: qty 5

## 2017-02-25 NOTE — Assessment & Plan Note (Signed)
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 T3N0 stage IIIb  01/19/2017:Right breast 10 x 8.3 x 6.9 cm area of cancer involving all 4 quadrants with skin involvement extends from nipple to pectoralis muscle and involving right axillary lymph nodes, right internal mammary nodes  01/22/2017: CT CAP: 1.9 cm dome of the right hepatic lobe suspicious for liver metastases, 9 mm groundglass nodule in the right lung apex could be inflammation versus cancer Liver MRI: Lesion is benign involuted cyst  Treatment plan: 1. Neoadj chemo with dose dense AC foll by Taxoland carboplatin 2. Mastectomy 3. XRT 4. Consideration for participation in [place] clinical trial -------------------------------------------------------------------------------------------------------------------------------------------------- Current treatment: Cycle 3 day1dose dense Adriamycin Cytoxan Echocardiogram 01/18/17: EF65-70%  Chemo toxicities: 1.Fatigue 2.could not tolerate dexamethasone: Discontinued 3.Maculopapular skin rash on the chest: Resolved 4.ANC 0.1: I decreased the dosage with cycle 2  Closely monitoring for chemotherapy toxicities. Return to clinic in 2 weeks forcycle 4

## 2017-02-25 NOTE — Patient Instructions (Signed)
[facility] Discharge Instructions for Patients Receiving Chemotherapy  Today you received the following chemotherapy agents: Doxorubicin and Cytoxan   To help prevent nausea and vomiting after your treatment, we encourage you to take your nausea medication as directed.    If you develop nausea and vomiting that is not controlled by your nausea medication, call the clinic.   BELOW ARE SYMPTOMS THAT SHOULD BE REPORTED IMMEDIATELY:  *FEVER GREATER THAN 100.5 F  *CHILLS WITH OR WITHOUT FEVER  NAUSEA AND VOMITING THAT IS NOT CONTROLLED WITH YOUR NAUSEA MEDICATION  *UNUSUAL SHORTNESS OF BREATH  *UNUSUAL BRUISING OR BLEEDING  TENDERNESS IN MOUTH AND THROAT WITH OR WITHOUT PRESENCE OF ULCERS  *URINARY PROBLEMS  *BOWEL PROBLEMS  UNUSUAL RASH Items with * indicate a potential emergency and should be followed up as soon as possible.  Feel free to call the clinic should you have any questions or concerns. The clinic phone number is [phone].  Please show the CHEMO ALERT CARD at check-in to the Emergency Department and triage nurse.   

## 2017-02-25 NOTE — Progress Notes (Signed)
Patient Care Team: [name], MD as PCP - General (Family Medicine)  DIAGNOSIS:  Encounter Diagnosis  Name Primary?  . 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  T3N0 stage IIIb      01/19/2017 Breast MRI    Right breast 10 x 8.3 x 6.9 cm area of cancer involving all 4 quadrants with skin involvement extends from nipple to pectoralis muscle and involving right axillary lymph nodes, right internal mammary nodes      01/22/2017 Imaging    CT chest abdomen pelvis reveals a 6 cm right breast mass, mild right axillary lymphadenopathy, 1.9 cm peripheral enhancing lesion in the dome of the right hepatic lobe, 9 mm groundglass nodule right lung apex      01/28/2017 -  Neo-Adjuvant Chemotherapy    Dose dense Adriamycin and Cytoxan  followed with Taxol and carboplatin       CHIEF COMPLIANT: Cycle 3 dose dense Adriamycin and Cytoxan  INTERVAL HISTORY: Kathryn [name] is a 57 year old with above-mentioned history of right breast cancer currently neoadjuvant chemotherapy today cycle 3 of dose dense Adriamycin and Cytoxan.  She reports no side effects to chemotherapy other than alopecia.  Energy levels taste and appetite are excellent.  Denies any nausea or vomiting.  REVIEW OF SYSTEMS:   Constitutional: Denies fevers, chills or abnormal weight loss Eyes: Denies blurriness of vision Ears, nose, mouth, throat, and face: Denies mucositis or sore throat Respiratory:  Denies cough, dyspnea or wheezes Cardiovascular: Denies palpitation, chest discomfort Gastrointestinal:  Denies nausea, heartburn or change in bowel habits Skin: Denies abnormal skin rashes Lymphatics: Denies new lymphadenopathy or easy bruising Neurological:Denies numbness, tingling or new weaknesses Behavioral/Psych: Mood is stable, no new changes  Extremities: No lower extremity edema Breast:  denies any pain or lumps or nodules in either breasts All other systems were reviewed with the patient and are negative.  I have reviewed the past medical history, past surgical history, social history and family history with the patient and they are unchanged from previous note.  ALLERGIES:  has No Known Allergies.  MEDICATIONS:  Current Outpatient Medications  Medication Sig Dispense Refill  . cholecalciferol (VITAMIN D) 1000 UNITS tablet Take 1,000 Units by mouth daily.    . clonazePAM (KLONOPIN) 0.5 MG tablet Take 1 tablet (0.5 mg total) by mouth 3 (three) times daily as needed for anxiety. 90 tablet 5  . cyclobenzaprine (FLEXERIL) 10 MG tablet Take 10 mg by mouth 3 (three) times daily as needed for muscle spasms.    Marland Kitchen lidocaine-prilocaine (EMLA) cream Apply to affected area once (Patient taking differently: Apply 1 application topically 3 times/day as needed-between meals & bedtime. Apply to affected area once) 30 g 3  . LORazepam (ATIVAN) 0.5 MG tablet Take 1 tablet (0.5 mg total) by mouth at bedtime as needed (Nausea or vomiting). 30 tablet 0  . Misc Natural Products (ACAI+SUPERFRUIT/GREEN TEA) TABS Take 1 tablet by mouth daily.    . Multiple Vitamin (MULITIVITAMIN WITH MINERALS) TABS Take  1 tablet by mouth daily. Plus vit C 530m    . naproxen (NAPROSYN) 500 MG tablet Take 500 mg by mouth every 12 (twelve) hours as needed.    . ondansetron (ZOFRAN) 8 MG tablet Take 1 tablet (8 mg total) by mouth 2 (two) times daily as needed. Start on the third day after chemotherapy. 30 tablet 1  .  prochlorperazine (COMPAZINE) 10 MG tablet Take 1 tablet (10 mg total) by mouth every 6 (six) hours as needed (Nausea or vomiting). 30 tablet 1   No current facility-administered medications for this visit.     PHYSICAL EXAMINATION: ECOG PERFORMANCE STATUS: 0 - Asymptomatic  Vitals:   02/25/17 0903  BP: 134/88  Pulse: (!) 117  Resp: 19  Temp: 97.6 F (36.4 C)  SpO2: 100%   Filed Weights   02/25/17 0903  Weight: 158 lb 6.4 oz (71.8 kg)    GENERAL:alert, no distress and comfortable SKIN: skin color, texture, turgor are normal, no rashes or significant lesions EYES: normal, Conjunctiva are pink and non-injected, sclera clear OROPHARYNX:no exudate, no erythema and lips, buccal mucosa, and tongue normal  NECK: supple, thyroid normal size, non-tender, without nodularity LYMPH:  no palpable lymphadenopathy in the cervical, axillary or inguinal LUNGS: clear to auscultation and percussion with normal breathing effort HEART: regular rate & rhythm and no murmurs and no lower extremity edema ABDOMEN:abdomen soft, non-tender and normal bowel sounds MUSCULOSKELETAL:no cyanosis of digits and no clubbing  NEURO: alert & oriented x 3 with fluent speech, no focal motor/sensory deficits EXTREMITIES: No lower extremity edema  LABORATORY DATA:  I have reviewed the data as listed CMP Latest Ref Rng & Units 02/11/2017 02/04/2017 01/28/2017  Glucose 70 - 140 mg/dL 232(H) 169(H) 112  BUN 7 - 26 mg/dL _0 Creatinine 0.60 - 1.10 mg/dL 0.84 0.75 0.79  Sodium 136 - 145 mmol/L 140 138 141  Potassium 3.5 - 5.1 mmol/L 3.8 3.7 4.1  Chloride 98 - 109 mmol/L 106 102 106  CO2 22 - 29 mmol/L _1 Calcium 8.4 - 10.4 mg/dL 9.0 9.1 9.0  Total Protein 6.4 - 8.3 g/dL 6.4 6.2(L) 6.6  Total Bilirubin 0.2 - 1.2 mg/dL <0.2(L) 0.4 0.3  Alkaline Phos 40 - 150 U/L 109 75 76  AST 5 - 34 U/L _2 ALT 0 - 55 U/L _3 Lab Results  Component Value Date   WBC 10.5 (H) 02/25/2017   HGB 12.6  02/11/2017   HCT 37.9 02/25/2017   MCV 92.2 02/25/2017   PLT 158 02/25/2017   NEUTROABS 8.7 (H) 02/25/2017    ASSESSMENT & PLAN:  Malignant ne[place] of upper-outer quadrant of right breast in [place]: Palpable right breast lump for 6 months, mammogram revealed 6 cm mass at 1 o'clock position 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 T3N0 stage IIIb  01/19/2017:Right breast 10 x 8.3 x 6.9 cm area of cancer involving all 4 quadrants with skin involvement extends from nipple to pectoralis muscle and involving right axillary lymph nodes, right internal mammary nodes  01/22/2017: CT CAP: 1.9 cm dome of the right hepatic lobe suspicious for liver metastases, 9 mm groundglass nodule in the right lung apex could be inflammation versus cancer Liver MRI: Lesion is benign involuted cyst  Treatment plan: 1. Neoadj  chemo with dose dense AC foll by Taxoland carboplatin 2. Mastectomy 3. XRT 4. Consideration for participation in [place] clinical trial -------------------------------------------------------------------------------------------------------------------------------------------------- Current treatment: Cycle 3 day1dose dense Adriamycin Cytoxan Echocardiogram 01/18/17: EF65-70%  Chemo toxicities: 1.Fatigue 2.could not tolerate dexamethasone: Discontinued 3.Maculopapular skin rash on the chest: Resolved 4.  Alopecia  Closely monitoring for chemotherapy toxicities. Return to clinic in 2 weeks forcycle 4  I spent 25 minutes talking to the patient of which more than half was spent in counseling and coordination of care.  No orders of the defined types were placed in this encounter.  The patient has a good understanding of the overall plan. she agrees with it. she will call  with any problems that may develop before the next visit here.   Harriette Ohara, MD 02/25/17

## 2017-02-26 ENCOUNTER — Telehealth: Payer: Self-pay | Admitting: Hematology and Oncology

## 2017-02-26 NOTE — Telephone Encounter (Signed)
Mailed patient calendar of upcoming March through May appointments.

## 2017-03-11 ENCOUNTER — Inpatient Hospital Stay (HOSPITAL_BASED_OUTPATIENT_CLINIC_OR_DEPARTMENT_OTHER): Payer: Medicaid Other | Admitting: Hematology and Oncology

## 2017-03-11 ENCOUNTER — Inpatient Hospital Stay: Payer: Medicaid Other

## 2017-03-11 VITALS — HR 113

## 2017-03-11 DIAGNOSIS — R5383 Other fatigue: Secondary | ICD-10-CM | POA: Diagnosis not present

## 2017-03-11 DIAGNOSIS — Z171 Estrogen receptor negative status [ER-]: Principal | ICD-10-CM

## 2017-03-11 DIAGNOSIS — Z95828 Presence of other vascular implants and grafts: Secondary | ICD-10-CM

## 2017-03-11 DIAGNOSIS — R21 Rash and other nonspecific skin eruption: Secondary | ICD-10-CM | POA: Diagnosis not present

## 2017-03-11 DIAGNOSIS — K7689 Other specified diseases of liver: Secondary | ICD-10-CM

## 2017-03-11 DIAGNOSIS — C773 Secondary and unspecified malignant neoplasm of axilla and upper limb lymph nodes: Secondary | ICD-10-CM

## 2017-03-11 DIAGNOSIS — C50411 Malignant neoplasm of upper-outer quadrant of right female breast: Secondary | ICD-10-CM

## 2017-03-11 DIAGNOSIS — L658 Other specified nonscarring hair loss: Secondary | ICD-10-CM | POA: Diagnosis not present

## 2017-03-11 DIAGNOSIS — Z79899 Other long term (current) drug therapy: Secondary | ICD-10-CM | POA: Diagnosis not present

## 2017-03-11 DIAGNOSIS — Z5111 Encounter for antineoplastic chemotherapy: Secondary | ICD-10-CM | POA: Diagnosis not present

## 2017-03-11 LAB — CBC WITH DIFFERENTIAL/PLATELET
BASOS PCT: 1 %
Basophils Absolute: 0.2 10*3/uL — ABNORMAL HIGH (ref 0.0–0.1)
Eosinophils Absolute: 0.1 10*3/uL (ref 0.0–0.5)
Eosinophils Relative: 1 %
HEMATOCRIT: 38 % (ref 34.8–46.6)
HEMOGLOBIN: 12.7 g/dL (ref 11.6–15.9)
LYMPHS ABS: 1 10*3/uL (ref 0.9–3.3)
Lymphocytes Relative: 7 %
MCH: 30.3 pg (ref 25.1–34.0)
MCHC: 33.5 g/dL (ref 31.5–36.0)
MCV: 90.6 fL (ref 79.5–101.0)
MONO ABS: 0.7 10*3/uL (ref 0.1–0.9)
MONOS PCT: 5 %
NEUTROS ABS: 11.3 10*3/uL — AB (ref 1.5–6.5)
NEUTROS PCT: 86 %
Platelets: 168 10*3/uL (ref 145–400)
RBC: 4.2 MIL/uL (ref 3.70–5.45)
RDW: 17.8 % — AB (ref 11.2–14.5)
WBC: 13.2 10*3/uL — ABNORMAL HIGH (ref 3.9–10.3)

## 2017-03-11 LAB — COMPREHENSIVE METABOLIC PANEL
ALBUMIN: 3.8 g/dL (ref 3.5–5.0)
ALK PHOS: 138 U/L (ref 40–150)
ALT: 25 U/L (ref 0–55)
ANION GAP: 11 (ref 3–11)
AST: 18 U/L (ref 5–34)
BUN: 11 mg/dL (ref 7–26)
CALCIUM: 9.8 mg/dL (ref 8.4–10.4)
CO2: 27 mmol/L (ref 22–29)
Chloride: 106 mmol/L (ref 98–109)
Creatinine, Ser: 0.79 mg/dL (ref 0.60–1.10)
GFR calc non Af Amer: >60 mL/min (ref >60)
GLUCOSE: 140 mg/dL (ref 70–140)
Potassium: 3.8 mmol/L (ref 3.5–5.1)
SODIUM: 144 mmol/L (ref 136–145)
Total Bilirubin: 0.3 mg/dL (ref 0.2–1.2)
Total Protein: 6.7 g/dL (ref 6.4–8.3)

## 2017-03-11 MED ORDER — HEPARIN SOD (PORK) LOCK FLUSH 100 UNIT/ML IV SOLN
500.0000 [IU] | Freq: Once | INTRAVENOUS | Status: AC | PRN
  Administered 2017-03-11: 500 [IU]
  Filled 2017-03-11: qty 5

## 2017-03-11 MED ORDER — SODIUM CHLORIDE 0.9 % IV SOLN
500.0000 mg/m2 | Freq: Once | INTRAVENOUS | Status: AC
  Administered 2017-03-11: 880 mg via INTRAVENOUS
  Filled 2017-03-11: qty 44

## 2017-03-11 MED ORDER — DEXAMETHASONE SODIUM PHOSPHATE 10 MG/ML IJ SOLN
INTRAMUSCULAR | Status: AC
  Filled 2017-03-11: qty 1

## 2017-03-11 MED ORDER — SODIUM CHLORIDE 0.9% FLUSH
10.0000 mL | INTRAVENOUS | Status: DC | PRN
  Administered 2017-03-11: 10 mL
  Filled 2017-03-11: qty 10

## 2017-03-11 MED ORDER — HEPARIN SOD (PORK) LOCK FLUSH 100 UNIT/ML IV SOLN
500.0000 [IU] | Freq: Once | INTRAVENOUS | Status: DC | PRN
  Filled 2017-03-11: qty 5

## 2017-03-11 MED ORDER — SODIUM CHLORIDE 0.9 % IV SOLN
Freq: Once | INTRAVENOUS | Status: AC
  Administered 2017-03-11: 15:00:00 via INTRAVENOUS

## 2017-03-11 MED ORDER — DOXORUBICIN HCL CHEMO IV INJECTION 2 MG/ML
50.0000 mg/m2 | Freq: Once | INTRAVENOUS | Status: AC
  Administered 2017-03-11: 88 mg via INTRAVENOUS
  Filled 2017-03-11: qty 44

## 2017-03-11 MED ORDER — PEGFILGRASTIM 6 MG/0.6ML ~~LOC~~ PSKT
6.0000 mg | PREFILLED_SYRINGE | Freq: Once | SUBCUTANEOUS | Status: AC
  Administered 2017-03-11: 6 mg via SUBCUTANEOUS

## 2017-03-11 MED ORDER — PEGFILGRASTIM 6 MG/0.6ML ~~LOC~~ PSKT
PREFILLED_SYRINGE | SUBCUTANEOUS | Status: AC
  Filled 2017-03-11: qty 0.6

## 2017-03-11 MED ORDER — SODIUM CHLORIDE 0.9 % IV SOLN
Freq: Once | INTRAVENOUS | Status: AC
  Administered 2017-03-11: 16:00:00 via INTRAVENOUS
  Filled 2017-03-11: qty 5

## 2017-03-11 MED ORDER — SODIUM CHLORIDE 0.9% FLUSH
10.0000 mL | INTRAVENOUS | Status: DC | PRN
  Administered 2017-03-11: 10 mL via INTRAVENOUS
  Filled 2017-03-11: qty 10

## 2017-03-11 MED ORDER — PALONOSETRON HCL INJECTION 0.25 MG/5ML
0.2500 mg | Freq: Once | INTRAVENOUS | Status: AC
  Administered 2017-03-11: 0.25 mg via INTRAVENOUS

## 2017-03-11 MED ORDER — PALONOSETRON HCL INJECTION 0.25 MG/5ML
INTRAVENOUS | Status: AC
  Filled 2017-03-11: qty 5

## 2017-03-11 NOTE — Assessment & Plan Note (Signed)
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 T3N0 stage IIIb  01/19/2017:Right breast 10 x 8.3 x 6.9 cm area of cancer involving all 4 quadrants with skin involvement extends from nipple to pectoralis muscle and involving right axillary lymph nodes, right internal mammary nodes  01/22/2017: CT CAP: 1.9 cm dome of the right hepatic lobe suspicious for liver metastases, 9 mm groundglass nodule in the right lung apex could be inflammation versus cancer Liver MRI: Lesion is benign involuted cyst  Treatment plan: 1. Neoadj chemo with dose dense AC foll by Taxoland carboplatin 2. Mastectomy 3. XRT 4. Consideration for participation in [facility] S1418 clinical trial -------------------------------------------------------------------------------------------------------------------------------------------------- Current treatment: Cycle4day1dose dense Adriamycin Cytoxan Echocardiogram 01/18/17: EF65-70%  Chemo toxicities: 1.Fatigue 2.could not tolerate dexamethasone: Discontinued 3.Maculopapular skin rash on the chest: Resolved 4.  Alopecia  Closely monitoring for chemotherapy toxicities. Return to clinic in2weeksforcycle5   

## 2017-03-11 NOTE — Patient Instructions (Signed)
[place] Discharge Instructions for Patients Receiving Chemotherapy  Today you received the following chemotherapy agents Adriamycin and Cytoxan  To help prevent nausea and vomiting after your treatment, we encourage you to take your nausea medication as directed   If you develop nausea and vomiting that is not controlled by your nausea medication, call the clinic.   BELOW ARE SYMPTOMS THAT SHOULD BE REPORTED IMMEDIATELY:  *FEVER GREATER THAN 100.5 F  *CHILLS WITH OR WITHOUT FEVER  NAUSEA AND VOMITING THAT IS NOT CONTROLLED WITH YOUR NAUSEA MEDICATION  *UNUSUAL SHORTNESS OF BREATH  *UNUSUAL BRUISING OR BLEEDING  TENDERNESS IN MOUTH AND THROAT WITH OR WITHOUT PRESENCE OF ULCERS  *URINARY PROBLEMS  *BOWEL PROBLEMS  UNUSUAL RASH Items with * indicate a potential emergency and should be followed up as soon as possible.  Feel free to call the clinic should you have any questions or concerns. The clinic phone number is [phone].  Please show the [place] at check-in to the Emergency Department and triage nurse.   Doxycycline injection (Adriamycin) What is this medicine? DOXYCYCLINE (dox i SYE kleen) is a tetracycline antibiotic. It kills certain bacteria or stops their growth. It is used to treat many kinds of infections, like skin, stomach, respiratory, and urinary tract infections. It also treats Lyme disease and certain sexually transmitted infections. This medicine may be used for other purposes; ask your health care provider or pharmacist if you have questions. COMMON BRAND NAME(S): Doxy 100 What should I tell my health care provider before I take this medicine? They need to know if you have any of these conditions: -bowel disease like colitis -liver disease -[place] exposure to sunlight like working outdoors -an unusual or allergic reaction to doxycycline, tetracycline antibiotics, other medicines, foods, dyes, or preservatives -pregnant or  trying to get pregnant -breast-feeding How should I use this medicine? This medicine is for infusion into a vein. It is given in a hospital or clinic setting by a health care professional. Talk to your pediatrician regarding the use of this medicine in children. While this drug may be prescribed for selected conditions, precautions do apply. Overdosage: If you think you have taken too much of this medicine contact a poison control center or emergency room at once. NOTE: This medicine is only for you. Do not share this medicine with others. What if I miss a dose? This does not apply. What may interact with this medicine? -barbiturates -birth control pills -carbamazepine -methoxyflurane -other antibiotics -phenytoin -warfarin This list may not describe all possible interactions. Give your health care provider a list of all the medicines, herbs, non-prescription drugs, or dietary supplements you use. Also tell them if you smoke, drink alcohol, or use illegal drugs. Some items may interact with your medicine. What should I watch for while using this medicine? Tell your doctor or health care professional if your symptoms do not improve. Do not treat diarrhea with over the counter products. Contact your doctor if you have diarrhea that lasts more than 2 days or if it is severe and watery. This medicine can make you more sensitive to the sun. Keep out of the sun. If you cannot avoid being in the sun, wear protective clothing and use sunscreen. Do not use sun lamps or tanning beds/booths. If you are being treated for a sexually transmitted infection, avoid sexual contact until you have finished your treatment. Your sexual partner may also need treatment. Birth control pills may not work properly while you are taking  this medicine. Talk to your doctor about using an extra method of birth control. What side effects may I notice from receiving this medicine? Side effects that you should report to your  doctor or health care professional as soon as possible: -allergic reactions like skin rash, itching or hives, swelling of the face, lips, or tongue -difficulty breathing -fever -itching in the rectal or genital area -pain on swallowing -redness, blistering, peeling or loosening of the skin, including inside the mouth -severe stomach pain or cramps -unusual bleeding or bruising -unusually weak or tired -yellowing of the eyes or skin Side effects that usually do not require medical attention (report to your doctor or health care professional if they continue or are bothersome): -diarrhea -loss of appetite -nausea, vomiting This list may not describe all possible side effects. Call your doctor for medical advice about side effects. You may report side effects to FDA at 1-800-FDA-1088. Where should I keep my medicine? This does not apply. You will only receive this medicine in a hospital or clinic setting. NOTE: This sheet is a summary. It may not cover all possible information. If you have questions about this medicine, talk to your doctor, pharmacist, or health care provider.  2018 Elsevier/Gold Standard (2015-01-30 17:14:03)   Cyclophosphamide injection (Cytoxan) What is this medicine? CYCLOPHOSPHAMIDE (sye kloe FOSS fa mide) is a chemotherapy drug. It slows the growth of cancer cells. This medicine is used to treat many types of cancer like lymphoma, myeloma, leukemia, breast cancer, and ovarian cancer, to name a few. This medicine may be used for other purposes; ask your health care provider or pharmacist if you have questions. COMMON BRAND NAME(S): Cytoxan, Neosar What should I tell my health care provider before I take this medicine? They need to know if you have any of these conditions: -blood disorders -history of other chemotherapy -infection -kidney disease -liver disease -recent or ongoing radiation therapy -tumors in the bone marrow -an unusual or allergic reaction to  cyclophosphamide, other chemotherapy, other medicines, foods, dyes, or preservatives -pregnant or trying to get pregnant -breast-feeding How should I use this medicine? This drug is usually given as an injection into a vein or muscle or by infusion into a vein. It is administered in a hospital or clinic by a specially trained health care professional. Talk to your pediatrician regarding the use of this medicine in children. Special care may be needed. Overdosage: If you think you have taken too much of this medicine contact a poison control center or emergency room at once. NOTE: This medicine is only for you. Do not share this medicine with others. What if I miss a dose? It is important not to miss your dose. Call your doctor or health care professional if you are unable to keep an appointment. What may interact with this medicine? This medicine may interact with the following medications: -amiodarone -amphotericin B -azathioprine -certain antiviral medicines for HIV or AIDS such as protease inhibitors (e.g., indinavir, ritonavir) and zidovudine -certain blood pressure medications such as benazepril, captopril, enalapril, fosinopril, lisinopril, moexipril, monopril, perindopril, quinapril, ramipril, trandolapril -certain cancer medications such as anthracyclines (e.g., daunorubicin, doxorubicin), busulfan, cytarabine, paclitaxel, pentostatin, tamoxifen, trastuzumab -certain diuretics such as chlorothiazide, chlorthalidone, hydrochlorothiazide, indapamide, metolazone -certain medicines that treat or prevent blood clots like warfarin -certain muscle relaxants such as succinylcholine -cyclosporine -etanercept -indomethacin -medicines to increase blood counts like filgrastim, pegfilgrastim, sargramostim -medicines used as general anesthesia -metronidazole -natalizumab This list may not describe all possible interactions. Give your health care provider  a list of all the medicines, herbs,  non-prescription drugs, or dietary supplements you use. Also tell them if you smoke, drink alcohol, or use illegal drugs. Some items may interact with your medicine. What should I watch for while using this medicine? Visit your doctor for checks on your progress. This drug may make you feel generally unwell. This is not uncommon, as chemotherapy can affect healthy cells as well as cancer cells. Report any side effects. Continue your course of treatment even though you feel ill unless your doctor tells you to stop. Drink water or other fluids as directed. Urinate often, even at night. In some cases, you may be given additional medicines to help with side effects. Follow all directions for their use. Call your doctor or health care professional for advice if you get a fever, chills or sore throat, or other symptoms of a cold or flu. Do not treat yourself. This drug decreases your body's ability to fight infections. Try to avoid being around people who are sick. This medicine may increase your risk to bruise or bleed. Call your doctor or health care professional if you notice any unusual bleeding. Be careful brushing and flossing your teeth or using a toothpick because you may get an infection or bleed more easily. If you have any dental work done, tell your dentist you are receiving this medicine. You may get drowsy or dizzy. Do not drive, use machinery, or do anything that needs mental alertness until you know how this medicine affects you. Do not become pregnant while taking this medicine or for 1 year after stopping it. Women should inform their doctor if they wish to become pregnant or think they might be pregnant. Men should not father a child while taking this medicine and for 4 months after stopping it. There is a potential for serious side effects to an unborn child. Talk to your health care professional or pharmacist for more information. Do not breast-feed an infant while taking this medicine. This  medicine may interfere with the ability to have a child. This medicine has caused ovarian failure in some women. This medicine has caused reduced sperm counts in some men. You should talk with your doctor or health care professional if you are concerned about your fertility. If you are going to have surgery, tell your doctor or health care professional that you have taken this medicine. What side effects may I notice from receiving this medicine? Side effects that you should report to your doctor or health care professional as soon as possible: -allergic reactions like skin rash, itching or hives, swelling of the face, lips, or tongue -low blood counts - this medicine may decrease the number of white blood cells, red blood cells and platelets. You may be at increased risk for infections and bleeding. -signs of infection - fever or chills, cough, sore throat, pain or difficulty passing urine -signs of decreased platelets or bleeding - bruising, pinpoint red spots on the skin, black, tarry stools, blood in the urine -signs of decreased red blood cells - unusually weak or tired, fainting spells, lightheadedness -breathing problems -dark urine -dizziness -palpitations -swelling of the ankles, feet, hands -trouble passing urine or change in the amount of urine -weight gain -yellowing of the eyes or skin Side effects that usually do not require medical attention (report to your doctor or health care professional if they continue or are bothersome): -changes in nail or skin color -hair loss -missed menstrual periods -mouth sores -nausea, vomiting This list  may not describe all possible side effects. Call your doctor for medical advice about side effects. You may report side effects to FDA at 1-800-FDA-1088. Where should I keep my medicine? This drug is given in a hospital or clinic and will not be stored at home. NOTE: This sheet is a summary. It may not cover all possible information. If you have  questions about this medicine, talk to your doctor, pharmacist, or health care provider.  2018 Elsevier/Gold Standard (2011-11-13 16:22:58)

## 2017-03-11 NOTE — Progress Notes (Signed)
Per Dr Lindi Adie ok to tx with HR 113 today.

## 2017-03-11 NOTE — Progress Notes (Signed)
Patient Care Team: [name], MD as PCP - General (Family Medicine)  DIAGNOSIS:  Encounter Diagnosis  Name Primary?  . 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  T3N0 stage IIIb      01/19/2017 Breast MRI    Right breast 10 x 8.3 x 6.9 cm area of cancer involving all 4 quadrants with skin involvement extends from nipple to pectoralis muscle and involving right axillary lymph nodes, right internal mammary nodes      01/22/2017 Imaging    CT chest abdomen pelvis reveals a 6 cm right breast mass, mild right axillary lymphadenopathy, 1.9 cm peripheral enhancing lesion in the dome of the right hepatic lobe, 9 mm groundglass nodule right lung apex      01/28/2017 -  Neo-Adjuvant Chemotherapy    Dose dense Adriamycin and Cytoxan  followed with Taxol and carboplatin       CHIEF COMPLIANT: Cycle 4 dose dense Adriamycin and Cytoxan  INTERVAL HISTORY: Kathryn [name] is a 73-year with above-mentioned history of right breast cancer currently neoadjuvant chemotherapy and today's cycle 4 of dose since he remains on Cytoxan.  She is tolerating extremely well.  She does not have any nausea vomiting.  She does have fatigue for 1 or 2 days after each chemotherapy.  REVIEW OF SYSTEMS:   Constitutional: Denies fevers, chills or abnormal weight loss Eyes: Denies blurriness of vision Ears, nose, mouth, throat, and face: Denies mucositis or sore throat Respiratory: Denies  cough, dyspnea or wheezes Cardiovascular: Denies palpitation, chest discomfort Gastrointestinal:  Denies nausea, heartburn or change in bowel habits Skin: Denies abnormal skin rashes Lymphatics: Denies new lymphadenopathy or easy bruising Neurological:Denies numbness, tingling or new weaknesses Behavioral/Psych: Mood is stable, no new changes  Extremities: No lower extremity edema  All other systems were reviewed with the patient and are negative.  I have reviewed the past medical history, past surgical history, social history and family history with the patient and they are unchanged from previous note.  ALLERGIES:  has No Known Allergies.  MEDICATIONS:  Current Outpatient Medications  Medication Sig Dispense Refill  . cholecalciferol (VITAMIN D) 1000 UNITS tablet Take 1,000 Units by mouth daily.    . clonazePAM (KLONOPIN) 0.5 MG tablet Take 1 tablet (0.5 mg total) by mouth 3 (three) times daily as needed for anxiety. 90 tablet 5  . cyclobenzaprine (FLEXERIL) 10 MG tablet Take 10 mg by mouth 3 (three) times daily as needed for muscle spasms.    Marland Kitchen lidocaine-prilocaine (EMLA) cream Apply to affected area once (Patient taking differently: Apply 1 application topically 3 times/day as needed-between meals & bedtime. Apply to affected area once) 30 g 3  . LORazepam (ATIVAN) 0.5 MG tablet Take 1 tablet (0.5 mg total) by mouth at bedtime as needed (Nausea or vomiting). 30 tablet 0  . Misc Natural Products (ACAI+SUPERFRUIT/GREEN TEA) TABS Take 1 tablet by mouth daily.    . Multiple Vitamin (MULITIVITAMIN WITH MINERALS) TABS Take 1 tablet by mouth daily. Plus vit  C '500mg'$     . naproxen (NAPROSYN) 500 MG tablet Take 500 mg by mouth every 12 (twelve) hours as needed.    . ondansetron (ZOFRAN) 8 MG tablet Take 1 tablet (8 mg total) by mouth 2 (two) times daily as needed. Start on the third day after chemotherapy. 30 tablet 1  . prochlorperazine (COMPAZINE) 10 MG tablet Take 1 tablet (10 mg total) by  mouth every 6 (six) hours as needed (Nausea or vomiting). 30 tablet 1   No current facility-administered medications for this visit.     PHYSICAL EXAMINATION: ECOG PERFORMANCE STATUS: 1 - Symptomatic but completely ambulatory  Vitals:   03/11/17 1410  BP: 124/78  Pulse: (!) 119  Resp: 18  Temp: 99.2 F (37.3 C)  SpO2: 99%   Filed Weights   03/11/17 1410  Weight: 154 lb 12.8 oz (70.2 kg)    GENERAL:alert, no distress and comfortable SKIN: skin color, texture, turgor are normal, no rashes or significant lesions EYES: normal, Conjunctiva are pink and non-injected, sclera clear OROPHARYNX:no exudate, no erythema and lips, buccal mucosa, and tongue normal  NECK: supple, thyroid normal size, non-tender, without nodularity LYMPH:  no palpable lymphadenopathy in the cervical, axillary or inguinal LUNGS: clear to auscultation and percussion with normal breathing effort HEART: regular rate & rhythm and no murmurs and no lower extremity edema ABDOMEN:abdomen soft, non-tender and normal bowel sounds MUSCULOSKELETAL:no cyanosis of digits and no clubbing  NEURO: alert & oriented x 3 with fluent speech, no focal motor/sensory deficits EXTREMITIES: No lower extremity edema  LABORATORY DATA:  I have reviewed the data as listed CMP Latest Ref Rng & Units 02/25/2017 02/11/2017 02/04/2017  Glucose 70 - 140 mg/dL 240(H) 232(H) 169(H)  BUN 7 - 26 mg/dL '12 14 12  '$ Creatinine 0.60 - 1.10 mg/dL 0.85 0.84 0.75  Sodium 136 - 145 mmol/L 139 140 138  Potassium 3.5 - 5.1 mmol/L 3.8 3.8 3.7  Chloride 98 - 109 mmol/L 106 106 102  CO2 22 - 29 mmol/L '22 24 26  '$ Calcium 8.4 - 10.4 mg/dL 8.8 9.0 9.1  Total Protein 6.4 - 8.3 g/dL 6.5 6.4 6.2(L)  Total Bilirubin 0.2 - 1.2 mg/dL 0.3 <0.2(L) 0.4  Alkaline Phos 40 - 150 U/L 128 109 75  AST 5 - 34 U/L '16 17 13  '$ ALT 0 - 55 U/L '22 22 20    '$ Lab Results  Component Value Date   WBC 13.2 (H) 03/11/2017   HGB 12.7 03/11/2017   HCT 38.0 03/11/2017   MCV 90.6  03/11/2017   PLT 168 03/11/2017   NEUTROABS 11.3 (H) 03/11/2017    ASSESSMENT & PLAN:  Malignant neoplasm of upper-outer quadrant of right breast in female, estrogen receptor negative ([place]) 12/29/2016: Palpable right breast lump for 6 months, mammogram revealed 6 cm mass at 1 o'clock position 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 T3N0 stage IIIb  01/19/2017:Right breast 10 x 8.3 x 6.9 cm area of cancer involving all 4 quadrants with skin involvement extends from nipple to pectoralis muscle and involving right axillary lymph nodes, right internal mammary nodes  01/22/2017: CT CAP: 1.9 cm dome of the right hepatic lobe suspicious for liver metastases, 9 mm groundglass nodule in the right lung apex could be inflammation versus cancer Liver MRI: Lesion is benign involuted cyst  Treatment plan: 1. Neoadj chemo with dose dense  AC foll by Taxoland carboplatin 2. Mastectomy 3. XRT 4. Consideration for participation in [place] clinical trial -------------------------------------------------------------------------------------------------------------------------------------------------- Current treatment: Cycle4day1dose dense Adriamycin Cytoxan Echocardiogram 01/18/17: EF65-70%  Chemo toxicities: 1.Fatigue 2.could not tolerate dexamethasone: Discontinued 3.Maculopapular skin rash on the chest: Resolved 4.  Alopecia  Closely monitoring for chemotherapy toxicities. Return to clinic [id number]     I spent 25 minutes talking to the patient of which more than half was spent in counseling and coordination of care.  No orders of the defined types were placed in this encounter.  The patient has a good understanding of the overall plan. she agrees with it. she will call with any problems that may develop  before the next visit here.   Harriette Ohara, MD 03/11/17

## 2017-03-25 ENCOUNTER — Inpatient Hospital Stay: Payer: Medicaid Other | Attending: Radiation Oncology

## 2017-03-25 ENCOUNTER — Inpatient Hospital Stay: Payer: Medicaid Other

## 2017-03-25 ENCOUNTER — Inpatient Hospital Stay (HOSPITAL_BASED_OUTPATIENT_CLINIC_OR_DEPARTMENT_OTHER): Payer: Medicaid Other | Admitting: Hematology and Oncology

## 2017-03-25 ENCOUNTER — Encounter: Payer: Self-pay | Admitting: *Deleted

## 2017-03-25 VITALS — BP 134/87 | HR 105 | Temp 98.2°F | Resp 17

## 2017-03-25 DIAGNOSIS — L658 Other specified nonscarring hair loss: Secondary | ICD-10-CM | POA: Diagnosis not present

## 2017-03-25 DIAGNOSIS — C50411 Malignant neoplasm of upper-outer quadrant of right female breast: Secondary | ICD-10-CM

## 2017-03-25 DIAGNOSIS — Z5111 Encounter for antineoplastic chemotherapy: Secondary | ICD-10-CM | POA: Insufficient documentation

## 2017-03-25 DIAGNOSIS — Z79899 Other long term (current) drug therapy: Secondary | ICD-10-CM | POA: Insufficient documentation

## 2017-03-25 DIAGNOSIS — K769 Liver disease, unspecified: Secondary | ICD-10-CM | POA: Insufficient documentation

## 2017-03-25 DIAGNOSIS — Z171 Estrogen receptor negative status [ER-]: Principal | ICD-10-CM

## 2017-03-25 DIAGNOSIS — Z006 Encounter for examination for normal comparison and control in clinical research program: Secondary | ICD-10-CM | POA: Diagnosis not present

## 2017-03-25 DIAGNOSIS — R5383 Other fatigue: Secondary | ICD-10-CM | POA: Diagnosis not present

## 2017-03-25 DIAGNOSIS — R918 Other nonspecific abnormal finding of lung field: Secondary | ICD-10-CM | POA: Diagnosis not present

## 2017-03-25 DIAGNOSIS — Z452 Encounter for adjustment and management of vascular access device: Secondary | ICD-10-CM | POA: Insufficient documentation

## 2017-03-25 LAB — COMPREHENSIVE METABOLIC PANEL
ALT: 20 U/L (ref 0–55)
ANION GAP: 12 — AB (ref 3–11)
AST: 16 U/L (ref 5–34)
Albumin: 3.7 g/dL (ref 3.5–5.0)
Alkaline Phosphatase: 129 U/L (ref 40–150)
BILIRUBIN TOTAL: 0.3 mg/dL (ref 0.2–1.2)
BUN: 10 mg/dL (ref 7–26)
CO2: 22 mmol/L (ref 22–29)
Calcium: 9.1 mg/dL (ref 8.4–10.4)
Chloride: 106 mmol/L (ref 98–109)
Creatinine, Ser: 0.83 mg/dL (ref 0.60–1.10)
GFR calc Af Amer: >60 mL/min (ref >60)
Glucose, Bld: 224 mg/dL — ABNORMAL HIGH (ref 70–140)
POTASSIUM: 3.5 mmol/L (ref 3.5–5.1)
Sodium: 140 mmol/L (ref 136–145)
TOTAL PROTEIN: 6.5 g/dL (ref 6.4–8.3)

## 2017-03-25 LAB — CBC WITH DIFFERENTIAL/PLATELET
BASOS ABS: 0.1 10*3/uL (ref 0.0–0.1)
Basophils Relative: 1 %
EOS PCT: 1 %
Eosinophils Absolute: 0.1 10*3/uL (ref 0.0–0.5)
HEMATOCRIT: 35.1 % (ref 34.8–46.6)
Hemoglobin: 11.9 g/dL (ref 11.6–15.9)
LYMPHS PCT: 8 %
Lymphs Abs: 0.8 10*3/uL — ABNORMAL LOW (ref 0.9–3.3)
MCH: 31.4 pg (ref 25.1–34.0)
MCHC: 33.9 g/dL (ref 31.5–36.0)
MCV: 92.6 fL (ref 79.5–101.0)
Monocytes Absolute: 0.5 10*3/uL (ref 0.1–0.9)
Monocytes Relative: 5 %
NEUTROS ABS: 8.9 10*3/uL — AB (ref 1.5–6.5)
Neutrophils Relative %: 85 %
PLATELETS: 150 10*3/uL (ref 145–400)
RBC: 3.79 MIL/uL (ref 3.70–5.45)
RDW: 20.2 % — ABNORMAL HIGH (ref 11.2–14.5)
WBC: 10.4 10*3/uL — AB (ref 3.9–10.3)

## 2017-03-25 MED ORDER — SODIUM CHLORIDE 0.9% FLUSH
10.0000 mL | INTRAVENOUS | Status: DC | PRN
  Administered 2017-03-25: 10 mL
  Filled 2017-03-25: qty 10

## 2017-03-25 MED ORDER — SODIUM CHLORIDE 0.9 % IV SOLN
Freq: Once | INTRAVENOUS | Status: AC
  Administered 2017-03-25: 10:00:00 via INTRAVENOUS
  Filled 2017-03-25: qty 5

## 2017-03-25 MED ORDER — DIPHENHYDRAMINE HCL 50 MG/ML IJ SOLN
50.0000 mg | Freq: Once | INTRAMUSCULAR | Status: AC
  Administered 2017-03-25: 50 mg via INTRAVENOUS

## 2017-03-25 MED ORDER — DIPHENHYDRAMINE HCL 50 MG/ML IJ SOLN
INTRAMUSCULAR | Status: AC
  Filled 2017-03-25: qty 1

## 2017-03-25 MED ORDER — SODIUM CHLORIDE 0.9 % IV SOLN
664.8000 mg | Freq: Once | INTRAVENOUS | Status: AC
  Administered 2017-03-25: 660 mg via INTRAVENOUS
  Filled 2017-03-25: qty 66

## 2017-03-25 MED ORDER — HEPARIN SOD (PORK) LOCK FLUSH 100 UNIT/ML IV SOLN
500.0000 [IU] | Freq: Once | INTRAVENOUS | Status: AC | PRN
  Administered 2017-03-25: 500 [IU]
  Filled 2017-03-25: qty 5

## 2017-03-25 MED ORDER — SODIUM CHLORIDE 0.9 % IV SOLN
80.0000 mg/m2 | Freq: Once | INTRAVENOUS | Status: AC
  Administered 2017-03-25: 144 mg via INTRAVENOUS
  Filled 2017-03-25: qty 24

## 2017-03-25 MED ORDER — SODIUM CHLORIDE 0.9 % IV SOLN
Freq: Once | INTRAVENOUS | Status: AC
  Administered 2017-03-25: 09:00:00 via INTRAVENOUS

## 2017-03-25 MED ORDER — FAMOTIDINE IN NACL 20-0.9 MG/50ML-% IV SOLN: 20.0000 mg | Freq: Once | INTRAVENOUS | Status: DC

## 2017-03-25 MED ORDER — PALONOSETRON HCL INJECTION 0.25 MG/5ML
INTRAVENOUS | Status: AC
  Filled 2017-03-25: qty 5

## 2017-03-25 MED ORDER — SODIUM CHLORIDE 0.9 % IV SOLN
20.0000 mg | Freq: Once | INTRAVENOUS | Status: AC
  Administered 2017-03-25: 20 mg via INTRAVENOUS
  Filled 2017-03-25: qty 2

## 2017-03-25 MED ORDER — PALONOSETRON HCL INJECTION 0.25 MG/5ML
0.2500 mg | Freq: Once | INTRAVENOUS | Status: AC
  Administered 2017-03-25: 0.25 mg via INTRAVENOUS

## 2017-03-25 NOTE — Patient Instructions (Addendum)
[facility] Discharge Instructions for Patients Receiving Chemotherapy  Today you received the following chemotherapy agents Paclitaxel, Carboplatin  To help prevent nausea and vomiting after your treatment, we encourage you to take your nausea medication as directed   If you develop nausea and vomiting that is not controlled by your nausea medication, call the clinic.   BELOW ARE SYMPTOMS THAT SHOULD BE REPORTED IMMEDIATELY:  *FEVER GREATER THAN 100.5 F  *CHILLS WITH OR WITHOUT FEVER  NAUSEA AND VOMITING THAT IS NOT CONTROLLED WITH YOUR NAUSEA MEDICATION  *UNUSUAL SHORTNESS OF BREATH  *UNUSUAL BRUISING OR BLEEDING  TENDERNESS IN MOUTH AND THROAT WITH OR WITHOUT PRESENCE OF ULCERS  *URINARY PROBLEMS  *BOWEL PROBLEMS  UNUSUAL RASH Items with * indicate a potential emergency and should be followed up as soon as possible.  Feel free to call the clinic should you have any questions or concerns. The clinic phone number is [phone].  Please show the CHEMO ALERT CARD at check-in to the Emergency Department and triage nurse.   Paclitaxel injection What is this medicine? PACLITAXEL (PAK li TAX el) is a chemotherapy drug. It targets fast dividing cells, like cancer cells, and causes these cells to die. This medicine is used to treat ovarian cancer, breast cancer, and other cancers. This medicine may be used for other purposes; ask your health care provider or pharmacist if you have questions. COMMON BRAND NAME(S): Onxol, Taxol What should I tell my health care provider before I take this medicine? They need to know if you have any of these conditions: -blood disorders -irregular heartbeat -infection (especially a virus infection such as chickenpox, cold sores, or herpes) -liver disease -previous or ongoing radiation therapy -an unusual or allergic reaction to paclitaxel, alcohol, polyoxyethylated castor oil, other chemotherapy agents, other medicines, foods,  dyes, or preservatives -pregnant or trying to get pregnant -breast-feeding How should I use this medicine? This drug is given as an infusion into a vein. It is administered in a hospital or clinic by a specially trained health care professional. Talk to your pediatrician regarding the use of this medicine in children. Special care may be needed. Overdosage: If you think you have taken too much of this medicine contact a poison control center or emergency room at once. NOTE: This medicine is only for you. Do not share this medicine with others. What if I miss a dose? It is important not to miss your dose. Call your doctor or health care professional if you are unable to keep an appointment. What may interact with this medicine? Do not take this medicine with any of the following medications: -disulfiram -metronidazole This medicine may also interact with the following medications: -cyclosporine -diazepam -ketoconazole -medicines to increase blood counts like filgrastim, pegfilgrastim, sargramostim -other chemotherapy drugs like cisplatin, doxorubicin, epirubicin, etoposide, teniposide, vincristine -quinidine -testosterone -vaccines -verapamil Talk to your doctor or health care professional before taking any of these medicines: -acetaminophen -aspirin -ibuprofen -ketoprofen -naproxen This list may not describe all possible interactions. Give your health care provider a list of all the medicines, herbs, non-prescription drugs, or dietary supplements you use. Also tell them if you smoke, drink alcohol, or use illegal drugs. Some items may interact with your medicine. What should I watch for while using this medicine? Your condition will be monitored carefully while you are receiving this medicine. You will need important blood work done while you are taking this medicine. This medicine can cause serious allergic reactions. To reduce your risk you will need   to take other medicine(s)  before treatment with this medicine. If you experience allergic reactions like skin rash, itching or hives, swelling of the face, lips, or tongue, tell your doctor or health care professional right away. In some cases, you may be given additional medicines to help with side effects. Follow all directions for their use. This drug may make you feel generally unwell. This is not uncommon, as chemotherapy can affect healthy cells as well as cancer cells. Report any side effects. Continue your course of treatment even though you feel ill unless your doctor tells you to stop. Call your doctor or health care professional for advice if you get a fever, chills or sore throat, or other symptoms of a cold or flu. Do not treat yourself. This drug decreases your body's ability to fight infections. Try to avoid being around people who are sick. This medicine may increase your risk to bruise or bleed. Call your doctor or health care professional if you notice any unusual bleeding. Be careful brushing and flossing your teeth or using a toothpick because you may get an infection or bleed more easily. If you have any dental work done, tell your dentist you are receiving this medicine. Avoid taking products that contain aspirin, acetaminophen, ibuprofen, naproxen, or ketoprofen unless instructed by your doctor. These medicines may hide a fever. Do not become pregnant while taking this medicine. Women should inform their doctor if they wish to become pregnant or think they might be pregnant. There is a potential for serious side effects to an unborn child. Talk to your health care professional or pharmacist for more information. Do not breast-feed an infant while taking this medicine. Men are advised not to father a child while receiving this medicine. This product may contain alcohol. Ask your pharmacist or healthcare provider if this medicine contains alcohol. Be sure to tell all healthcare providers you are taking this  medicine. Certain medicines, like metronidazole and disulfiram, can cause an unpleasant reaction when taken with alcohol. The reaction includes flushing, headache, nausea, vomiting, sweating, and increased thirst. The reaction can last from 30 minutes to several hours. What side effects may I notice from receiving this medicine? Side effects that you should report to your doctor or health care professional as soon as possible: -allergic reactions like skin rash, itching or hives, swelling of the face, lips, or tongue -low blood counts - This drug may decrease the number of white blood cells, red blood cells and platelets. You may be at increased risk for infections and bleeding. -signs of infection - fever or chills, cough, sore throat, pain or difficulty passing urine -signs of decreased platelets or bleeding - bruising, pinpoint red spots on the skin, black, tarry stools, nosebleeds -signs of decreased red blood cells - unusually weak or tired, fainting spells, lightheadedness -breathing problems -chest pain -high or low blood pressure -mouth sores -nausea and vomiting -pain, swelling, redness or irritation at the injection site -pain, tingling, numbness in the hands or feet -slow or irregular heartbeat -swelling of the ankle, feet, hands Side effects that usually do not require medical attention (report to your doctor or health care professional if they continue or are bothersome): -bone pain -complete hair loss including hair on your head, underarms, pubic hair, eyebrows, and eyelashes -changes in the color of fingernails -diarrhea -loosening of the fingernails -loss of appetite -muscle or joint pain -red flush to skin -sweating This list may not describe all possible side effects. Call your doctor for medical advice   about side effects. You may report side effects to FDA at 1-800-FDA-1088. Where should I keep my medicine? This drug is given in a hospital or clinic and will not be  stored at home. NOTE: This sheet is a summary. It may not cover all possible information. If you have questions about this medicine, talk to your doctor, pharmacist, or health care provider.  2018 Elsevier/Gold Standard (2014-10-30 19:58:00)  Carboplatin injection What is this medicine? CARBOPLATIN (KAR boe pla tin) is a chemotherapy drug. It targets fast dividing cells, like cancer cells, and causes these cells to die. This medicine is used to treat ovarian cancer and many other cancers. This medicine may be used for other purposes; ask your health care provider or pharmacist if you have questions. COMMON BRAND NAME(S): Paraplatin What should I tell my health care provider before I take this medicine? They need to know if you have any of these conditions: -blood disorders -hearing problems -kidney disease -recent or ongoing radiation therapy -an unusual or allergic reaction to carboplatin, cisplatin, other chemotherapy, other medicines, foods, dyes, or preservatives -pregnant or trying to get pregnant -breast-feeding How should I use this medicine? This drug is usually given as an infusion into a vein. It is administered in a hospital or clinic by a specially trained health care professional. Talk to your pediatrician regarding the use of this medicine in children. Special care may be needed. Overdosage: If you think you have taken too much of this medicine contact a poison control center or emergency room at once. NOTE: This medicine is only for you. Do not share this medicine with others. What if I miss a dose? It is important not to miss a dose. Call your doctor or health care professional if you are unable to keep an appointment. What may interact with this medicine? -medicines for seizures -medicines to increase blood counts like filgrastim, pegfilgrastim, sargramostim -some antibiotics like amikacin, gentamicin, neomycin, streptomycin, tobramycin -vaccines Talk to your doctor or  health care professional before taking any of these medicines: -acetaminophen -aspirin -ibuprofen -ketoprofen -naproxen This list may not describe all possible interactions. Give your health care provider a list of all the medicines, herbs, non-prescription drugs, or dietary supplements you use. Also tell them if you smoke, drink alcohol, or use illegal drugs. Some items may interact with your medicine. What should I watch for while using this medicine? Your condition will be monitored carefully while you are receiving this medicine. You will need important blood work done while you are taking this medicine. This drug may make you feel generally unwell. This is not uncommon, as chemotherapy can affect healthy cells as well as cancer cells. Report any side effects. Continue your course of treatment even though you feel ill unless your doctor tells you to stop. In some cases, you may be given additional medicines to help with side effects. Follow all directions for their use. Call your doctor or health care professional for advice if you get a fever, chills or sore throat, or other symptoms of a cold or flu. Do not treat yourself. This drug decreases your body's ability to fight infections. Try to avoid being around people who are sick. This medicine may increase your risk to bruise or bleed. Call your doctor or health care professional if you notice any unusual bleeding. Be careful brushing and flossing your teeth or using a toothpick because you may get an infection or bleed more easily. If you have any dental work done, tell your dentist   you are receiving this medicine. Avoid taking products that contain aspirin, acetaminophen, ibuprofen, naproxen, or ketoprofen unless instructed by your doctor. These medicines may hide a fever. Do not become pregnant while taking this medicine. Women should inform their doctor if they wish to become pregnant or think they might be pregnant. There is a potential for  serious side effects to an unborn child. Talk to your health care professional or pharmacist for more information. Do not breast-feed an infant while taking this medicine. What side effects may I notice from receiving this medicine? Side effects that you should report to your doctor or health care professional as soon as possible: -allergic reactions like skin rash, itching or hives, swelling of the face, lips, or tongue -signs of infection - fever or chills, cough, sore throat, pain or difficulty passing urine -signs of decreased platelets or bleeding - bruising, pinpoint red spots on the skin, black, tarry stools, nosebleeds -signs of decreased red blood cells - unusually weak or tired, fainting spells, lightheadedness -breathing problems -changes in hearing -changes in vision -chest pain -high blood pressure -low blood counts - This drug may decrease the number of white blood cells, red blood cells and platelets. You may be at increased risk for infections and bleeding. -nausea and vomiting -pain, swelling, redness or irritation at the injection site -pain, tingling, numbness in the hands or feet -problems with balance, talking, walking -trouble passing urine or change in the amount of urine Side effects that usually do not require medical attention (report to your doctor or health care professional if they continue or are bothersome): -hair loss -loss of appetite -metallic taste in the mouth or changes in taste This list may not describe all possible side effects. Call your doctor for medical advice about side effects. You may report side effects to FDA at 1-800-FDA-1088. Where should I keep my medicine? This drug is given in a hospital or clinic and will not be stored at home. NOTE: This sheet is a summary. It may not cover all possible information. If you have questions about this medicine, talk to your doctor, pharmacist, or health care provider.  2018 Elsevier/Gold Standard  (2007-04-05 14:38:05)  

## 2017-03-25 NOTE — Assessment & Plan Note (Signed)
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 T3N0 stage IIIb  01/19/2017:Right breast 10 x 8.3 x 6.9 cm area of cancer involving all 4 quadrants with skin involvement extends from nipple to pectoralis muscle and involving right axillary lymph nodes, right internal mammary nodes  01/22/2017: CT CAP: 1.9 cm dome of the right hepatic lobe suspicious for liver metastases, 9 mm groundglass nodule in the right lung apex could be inflammation versus cancer Liver MRI: Lesion is benign involuted cyst  Treatment plan: 1.Neoadj chemo with dose dense AC foll by Taxoland carboplatin 2. Mastectomy 3. XRT 4.Consideration for participation inSWOG [phone] clinical trial -------------------------------------------------------------------------------------------------------------------------------------------------- Current treatment: Completed 4 cycles of dose dense Adriamycin Cytoxan, today cycle 1 Taxol Echocardiogram 01/18/17: EF65-70%  Chemo toxicities: 1.Fatigue 2.could not tolerate dexamethasone: Discontinued 3.Maculopapular skin rash on the chest: Resolved 4.Alopecia  Closely monitoring for chemotherapy toxicities. Return to clinic [id number]

## 2017-03-25 NOTE — Progress Notes (Signed)
Patient Care Team: [name], MD as PCP - General (Family Medicine)  DIAGNOSIS:  Encounter Diagnosis  Name Primary?  . 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  T3N0 stage IIIb      01/19/2017 Breast MRI    Right breast 10 x 8.3 x 6.9 cm area of cancer involving all 4 quadrants with skin involvement extends from nipple to pectoralis muscle and involving right axillary lymph nodes, right internal mammary nodes      01/22/2017 Imaging    CT chest abdomen pelvis reveals a 6 cm right breast mass, mild right axillary lymphadenopathy, 1.9 cm peripheral enhancing lesion in the dome of the right hepatic lobe, 9 mm groundglass nodule right lung apex      01/28/2017 -  Neo-Adjuvant Chemotherapy    Dose dense Adriamycin and Cytoxan  followed with Taxol and carboplatin       CHIEF COMPLIANT: Cycle 1 Taxol along with carboplatin  INTERVAL HISTORY: Kathryn [name] is a 30-year with above-mentioned history of right breast cancer currently neoadjuvant chemotherapy and today is cycle 1 of Taxol along with carboplatin.  She has tolerated Adriamycin and Cytoxan extremely well.  She does have fatigue but denies any nausea or vomiting.  She is requesting a letter to be off jury duty.  REVIEW OF SYSTEMS:   Constitutional: Denies fevers, chills or abnormal weight loss Eyes: Denies blurriness of vision Ears, nose, mouth, throat, and face: Denies mucositis or sore  throat Respiratory: Denies cough, dyspnea or wheezes Cardiovascular: Denies palpitation, chest discomfort Gastrointestinal:  Denies nausea, heartburn or change in bowel habits Skin: Denies abnormal skin rashes Lymphatics: Denies new lymphadenopathy or easy bruising Neurological:Denies numbness, tingling or new weaknesses Behavioral/Psych: Mood is stable, no new changes  Extremities: No lower extremity edema All other systems were reviewed with the patient and are negative.  I have reviewed the past medical history, past surgical history, social history and family history with the patient and they are unchanged from previous note.  ALLERGIES:  has No Known Allergies.  MEDICATIONS:  Current Outpatient Medications  Medication Sig Dispense Refill  . cholecalciferol (VITAMIN D) 1000 UNITS tablet Take 1,000 Units by mouth daily.    . clonazePAM (KLONOPIN) 0.5 MG tablet Take 1 tablet (0.5 mg total) by mouth 3 (three) times daily as needed for anxiety. 90 tablet 5  . cyclobenzaprine (FLEXERIL) 10 MG tablet Take 10 mg by mouth 3 (three) times daily as needed for muscle spasms.    Marland Kitchen lidocaine-prilocaine (EMLA) cream Apply to affected area once (Patient taking differently: Apply 1 application topically 3 times/day as needed-between meals & bedtime. Apply to affected area once) 30 g 3  . LORazepam (ATIVAN) 0.5 MG tablet Take 1 tablet (0.5 mg total) by mouth at bedtime as needed (Nausea or vomiting). 30 tablet 0  . Misc Natural Products (ACAI+SUPERFRUIT/GREEN TEA) TABS Take 1 tablet by mouth daily.    . Multiple Vitamin (MULITIVITAMIN WITH MINERALS) TABS Take 1 tablet by mouth daily. Plus  vit C 553m    . naproxen (NAPROSYN) 500 MG tablet Take 500 mg by mouth every 12 (twelve) hours as needed.    . ondansetron (ZOFRAN) 8 MG tablet Take 1 tablet (8 mg total) by mouth 2 (two) times daily as needed. Start on the third day after chemotherapy. 30 tablet 1  . prochlorperazine (COMPAZINE) 10 MG tablet Take 1  tablet (10 mg total) by mouth every 6 (six) hours as needed (Nausea or vomiting). 30 tablet 1   No current facility-administered medications for this visit.     PHYSICAL EXAMINATION: ECOG PERFORMANCE STATUS: 1 - Symptomatic but completely ambulatory  Vitals:   03/25/17 0822  BP: 127/78  Pulse: (!) 119  Resp: 18  Temp: 97.9 F (36.6 C)  SpO2: 100%   Filed Weights   03/25/17 0822  Weight: 156 lb 11.2 oz (71.1 kg)    GENERAL:alert, no distress and comfortable SKIN: skin color, texture, turgor are normal, no rashes or significant lesions EYES: normal, Conjunctiva are pink and non-injected, sclera clear OROPHARYNX:no exudate, no erythema and lips, buccal mucosa, and tongue normal  NECK: supple, thyroid normal size, non-tender, without nodularity LYMPH:  no palpable lymphadenopathy in the cervical, axillary or inguinal LUNGS: clear to auscultation and percussion with normal breathing effort HEART: regular rate & rhythm and no murmurs and no lower extremity edema ABDOMEN:abdomen soft, non-tender and normal bowel sounds MUSCULOSKELETAL:no cyanosis of digits and no clubbing  NEURO: alert & oriented x 3 with fluent speech, no focal motor/sensory deficits EXTREMITIES: No lower extremity edema  LABORATORY DATA:  I have reviewed the data as listed CMP Latest Ref Rng & Units 03/11/2017 02/25/2017 02/11/2017  Glucose 70 - 140 mg/dL 140 240(H) 232(H)  BUN 7 - 26 mg/dL _0 Creatinine 0.60 - 1.10 mg/dL 0.79 0.85 0.84  Sodium 136 - 145 mmol/L 144 139 140  Potassium 3.5 - 5.1 mmol/L 3.8 3.8 3.8  Chloride 98 - 109 mmol/L 106 106 106  CO2 22 - 29 mmol/L _1 Calcium 8.4 - 10.4 mg/dL 9.8 8.8 9.0  Total Protein 6.4 - 8.3 g/dL 6.7 6.5 6.4  Total Bilirubin 0.2 - 1.2 mg/dL 0.3 0.3 <0.2(L)  Alkaline Phos 40 - 150 U/L 138 128 109  AST 5 - 34 U/L _2 ALT 0 - 55 U/L _3 Lab Results  Component Value Date   WBC 10.4 (H) 03/25/2017   HGB 11.9 03/25/2017   HCT 35.1  03/25/2017   MCV 92.6 03/25/2017   PLT 150 03/25/2017   NEUTROABS 8.9 (H) 03/25/2017    ASSESSMENT & PLAN:  Malignant neoplasm of upper-outer quadrant of right breast in female, estrogen receptor negative [place] 12/29/2016: Palpable right breast lump for 6 months, mammogram revealed 6 cm mass at 1 o'clock position 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 T3N0 stage IIIb  01/19/2017:Right breast 10 x 8.3 x 6.9 cm area of cancer involving all 4 quadrants with skin involvement extends from nipple to pectoralis muscle and involving right axillary lymph nodes, right internal mammary nodes  01/22/2017: CT CAP: 1.9 cm dome of the right hepatic lobe suspicious for liver metastases, 9 mm groundglass nodule in the right lung apex could be inflammation versus cancer Liver MRI: Lesion is benign involuted cyst  Treatment plan: 1.Neoadj chemo with dose dense  AC foll by Taxoland carboplatin 2. Mastectomy 3. XRT 4.Consideration for participation inSWOG [phone] clinical trial -------------------------------------------------------------------------------------------------------------------------------------------------- Current treatment: Completed 4 cycles of dose dense Adriamycin Cytoxan, today cycle 1 Taxol with carboplatin Echocardiogram 01/18/17: EF65-70%  Chemo toxicities: 1.Fatigue 2.Alopecia  Closely monitoring for chemotherapy toxicities. Return to clinic [id number]  I spent 25 minutes talking to the patient of which more than half was spent in counseling and coordination of care.  No orders of the defined types were placed in this encounter.  The patient has a good understanding of the overall plan. she agrees with it. she will call with any problems that may develop before the next visit here.   Harriette Ohara, MD 03/25/17

## 2017-03-25 NOTE — Progress Notes (Signed)
Discharge instructions printed and verbally reviewed patient verbalized understanding.

## 2017-03-30 ENCOUNTER — Telehealth: Payer: Self-pay | Admitting: *Deleted

## 2017-03-30 NOTE — Telephone Encounter (Signed)
-----   Message from Smith Mince, RN sent at 03/25/2017  1:45 PM EDT ----- Regarding: Lindi Adie First time taxol/carbo First time Paclitaxel/Carboplatin. Patient tolerated well.

## 2017-03-30 NOTE — Telephone Encounter (Signed)
TCT patient to follow up with her after her 1st  Chemo of taxol/chemo. Spoke with her and she states she feels tired but otherwise ok. Denies fever/chills/nausea/vomiting/diarrhea.  She is drinking fluids well and appetite is good at this time. She is aware of her appts this week.

## 2017-04-01 ENCOUNTER — Inpatient Hospital Stay (HOSPITAL_BASED_OUTPATIENT_CLINIC_OR_DEPARTMENT_OTHER): Payer: Medicaid Other | Admitting: Hematology and Oncology

## 2017-04-01 ENCOUNTER — Inpatient Hospital Stay: Payer: Medicaid Other

## 2017-04-01 VITALS — HR 116

## 2017-04-01 DIAGNOSIS — Z95828 Presence of other vascular implants and grafts: Secondary | ICD-10-CM

## 2017-04-01 DIAGNOSIS — C50411 Malignant neoplasm of upper-outer quadrant of right female breast: Secondary | ICD-10-CM

## 2017-04-01 DIAGNOSIS — R530 Neoplastic (malignant) related fatigue: Secondary | ICD-10-CM | POA: Diagnosis not present

## 2017-04-01 DIAGNOSIS — Z171 Estrogen receptor negative status [ER-]: Principal | ICD-10-CM

## 2017-04-01 DIAGNOSIS — Z5111 Encounter for antineoplastic chemotherapy: Secondary | ICD-10-CM | POA: Diagnosis not present

## 2017-04-01 LAB — CBC WITH DIFFERENTIAL/PLATELET
BASOS ABS: 0 10*3/uL (ref 0.0–0.1)
BASOS PCT: 1 %
EOS PCT: 1 %
Eosinophils Absolute: 0 10*3/uL (ref 0.0–0.5)
HCT: 33.3 % — ABNORMAL LOW (ref 34.8–46.6)
Hemoglobin: 11.4 g/dL — ABNORMAL LOW (ref 11.6–15.9)
Lymphocytes Relative: 14 %
Lymphs Abs: 0.4 10*3/uL — ABNORMAL LOW (ref 0.9–3.3)
MCH: 31.1 pg (ref 25.1–34.0)
MCHC: 34.2 g/dL (ref 31.5–36.0)
MCV: 90.9 fL (ref 79.5–101.0)
MONO ABS: 0.3 10*3/uL (ref 0.1–0.9)
Monocytes Relative: 8 %
NEUTROS ABS: 2.4 10*3/uL (ref 1.5–6.5)
Neutrophils Relative %: 76 %
PLATELETS: 194 10*3/uL (ref 145–400)
RBC: 3.66 MIL/uL — ABNORMAL LOW (ref 3.70–5.45)
RDW: 19.7 % — AB (ref 11.2–14.5)
WBC: 3.2 10*3/uL — AB (ref 3.9–10.3)

## 2017-04-01 LAB — COMPREHENSIVE METABOLIC PANEL
ALBUMIN: 3.7 g/dL (ref 3.5–5.0)
ALK PHOS: 89 U/L (ref 40–150)
ALT: 30 U/L (ref 0–55)
ANION GAP: 9 (ref 3–11)
AST: 20 U/L (ref 5–34)
BILIRUBIN TOTAL: 0.2 mg/dL (ref 0.2–1.2)
BUN: 12 mg/dL (ref 7–26)
CALCIUM: 9.5 mg/dL (ref 8.4–10.4)
CO2: 27 mmol/L (ref 22–29)
Chloride: 104 mmol/L (ref 98–109)
Creatinine, Ser: 0.7 mg/dL (ref 0.60–1.10)
GFR calc Af Amer: >60 mL/min (ref >60)
GLUCOSE: 146 mg/dL — AB (ref 70–140)
Potassium: 3.3 mmol/L — ABNORMAL LOW (ref 3.5–5.1)
Sodium: 140 mmol/L (ref 136–145)
Total Protein: 6.4 g/dL (ref 6.4–8.3)

## 2017-04-01 MED ORDER — ALTEPLASE 2 MG IJ SOLR
INTRAMUSCULAR | Status: AC
  Filled 2017-04-01: qty 2

## 2017-04-01 MED ORDER — SODIUM CHLORIDE 0.9 % IV SOLN
80.0000 mg/m2 | Freq: Once | INTRAVENOUS | Status: AC
  Administered 2017-04-01: 144 mg via INTRAVENOUS
  Filled 2017-04-01: qty 24

## 2017-04-01 MED ORDER — SODIUM CHLORIDE 0.9% FLUSH
10.0000 mL | INTRAVENOUS | Status: DC | PRN
  Administered 2017-04-01: 10 mL
  Filled 2017-04-01: qty 10

## 2017-04-01 MED ORDER — FAMOTIDINE IN NACL 20-0.9 MG/50ML-% IV SOLN
20.0000 mg | Freq: Once | INTRAVENOUS | Status: AC
  Administered 2017-04-01: 20 mg via INTRAVENOUS

## 2017-04-01 MED ORDER — HEPARIN SOD (PORK) LOCK FLUSH 100 UNIT/ML IV SOLN
500.0000 [IU] | Freq: Once | INTRAVENOUS | Status: AC | PRN
  Administered 2017-04-01: 500 [IU]
  Filled 2017-04-01: qty 5

## 2017-04-01 MED ORDER — DIPHENHYDRAMINE HCL 50 MG/ML IJ SOLN
50.0000 mg | Freq: Once | INTRAMUSCULAR | Status: AC
  Administered 2017-04-01: 50 mg via INTRAVENOUS

## 2017-04-01 MED ORDER — DIPHENHYDRAMINE HCL 50 MG/ML IJ SOLN
INTRAMUSCULAR | Status: AC
  Filled 2017-04-01: qty 1

## 2017-04-01 MED ORDER — SODIUM CHLORIDE 0.9% FLUSH
10.0000 mL | INTRAVENOUS | Status: DC | PRN
  Administered 2017-04-01: 10 mL via INTRAVENOUS
  Filled 2017-04-01: qty 10

## 2017-04-01 MED ORDER — ALTEPLASE 2 MG IJ SOLR
2.0000 mg | Freq: Once | INTRAMUSCULAR | Status: AC | PRN
  Administered 2017-04-01: 2 mg
  Filled 2017-04-01: qty 2

## 2017-04-01 MED ORDER — FAMOTIDINE IN NACL 20-0.9 MG/50ML-% IV SOLN
INTRAVENOUS | Status: AC
  Filled 2017-04-01: qty 50

## 2017-04-01 MED ORDER — SODIUM CHLORIDE 0.9 % IV SOLN
Freq: Once | INTRAVENOUS | Status: AC
  Administered 2017-04-01: 11:00:00 via INTRAVENOUS

## 2017-04-01 MED ORDER — DEXAMETHASONE SODIUM PHOSPHATE 100 MG/10ML IJ SOLN
20.0000 mg | Freq: Once | INTRAMUSCULAR | Status: AC
  Administered 2017-04-01: 20 mg via INTRAVENOUS
  Filled 2017-04-01: qty 2

## 2017-04-01 NOTE — Progress Notes (Signed)
Per Dr. Lindi Adie, ok to proceed with current HR. Encouraged patient to f/u with PMD regarding persistent tachycardia. Verbalized understanding.

## 2017-04-01 NOTE — Progress Notes (Signed)
Patient Care Team: [name], MD as PCP - General (Family Medicine)  DIAGNOSIS:  Encounter Diagnosis  Name Primary?  . 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  T3N0 stage IIIb      01/19/2017 Breast MRI    Right breast 10 x 8.3 x 6.9 cm area of cancer involving all 4 quadrants with skin involvement extends from nipple to pectoralis muscle and involving right axillary lymph nodes, right internal mammary nodes      01/22/2017 Imaging    CT chest abdomen pelvis reveals a 6 cm right breast mass, mild right axillary lymphadenopathy, 1.9 cm peripheral enhancing lesion in the dome of the right hepatic lobe, 9 mm groundglass nodule right lung apex      01/28/2017 -  Neo-Adjuvant Chemotherapy    Dose dense Adriamycin and Cytoxan  followed with Taxol and carboplatin       CHIEF COMPLIANT: Taxol cycle 2  INTERVAL HISTORY: Kathryn [name] is a 57 year old with above-mentioned history of right breast cancer currently neoadjuvant chemotherapy today is cycle 2 of Taxol.  She is tolerating chemotherapy generally well.  She had profound fatigue that lasted 3 to 4 days.   REVIEW OF SYSTEMS:   Constitutional: Denies fevers, chills or abnormal weight loss Eyes: Denies blurriness of vision Ears, nose, mouth, throat, and face: Denies mucositis or sore throat Respiratory: Denies cough, dyspnea or wheezes Cardiovascular: Denies palpitation, chest  discomfort Gastrointestinal:  Denies nausea, heartburn or change in bowel habits Skin: Denies abnormal skin rashes Lymphatics: Denies new lymphadenopathy or easy bruising Neurological:Denies numbness, tingling or new weaknesses Behavioral/Psych: Mood is stable, no new changes  Extremities: No lower extremity edema  All other systems were reviewed with the patient and are negative.  I have reviewed the past medical history, past surgical history, social history and family history with the patient and they are unchanged from previous note.  ALLERGIES:  has No Known Allergies.  MEDICATIONS:  Current Outpatient Medications  Medication Sig Dispense Refill  . cholecalciferol (VITAMIN D) 1000 UNITS tablet Take 1,000 Units by mouth daily.    . clonazePAM (KLONOPIN) 0.5 MG tablet Take 1 tablet (0.5 mg total) by mouth 3 (three) times daily as needed for anxiety. 90 tablet 5  . cyclobenzaprine (FLEXERIL) 10 MG tablet Take 10 mg by mouth 3 (three) times daily as needed for muscle spasms.    Marland Kitchen lidocaine-prilocaine (EMLA) cream Apply to affected area once (Patient taking differently: Apply 1 application topically 3 times/day as needed-between meals & bedtime. Apply to affected area once) 30 g 3  . LORazepam (ATIVAN) 0.5 MG tablet Take 1 tablet (0.5 mg total) by mouth at bedtime as needed (Nausea or vomiting). 30 tablet 0  . Misc Natural Products (ACAI+SUPERFRUIT/GREEN TEA) TABS Take 1 tablet by mouth daily.    . Multiple Vitamin (MULITIVITAMIN WITH MINERALS) TABS Take 1 tablet by mouth daily. Plus vit C 553m    . naproxen (NAPROSYN) 500 MG tablet Take 500 mg by mouth every  12 (twelve) hours as needed.    . ondansetron (ZOFRAN) 8 MG tablet Take 1 tablet (8 mg total) by mouth 2 (two) times daily as needed. Start on the third day after chemotherapy. 30 tablet 1  . prochlorperazine (COMPAZINE) 10 MG tablet Take 1 tablet (10 mg total) by mouth every 6 (six) hours as needed (Nausea or vomiting). 30 tablet 1    No current facility-administered medications for this visit.    Facility-Administered Medications Ordered in Other Visits  Medication Dose Route Frequency Provider Last Rate Last Dose  . dexamethasone (DECADRON) 20 mg in sodium chloride 0.9 % 50 mL IVPB  20 mg Intravenous Once Nicholas Lose, MD   20 mg at 04/01/17 1132  . heparin lock flush 100 unit/mL  500 Units Intracatheter Once PRN Nicholas Lose, MD      . PACLitaxel (TAXOL) 144 mg in sodium chloride 0.9 % 250 mL chemo infusion (</= 48m/m2)  80 mg/m2 (Treatment Plan Recorded) Intravenous Once GNicholas Lose MD      . sodium chloride flush (NS) 0.9 % injection 10 mL  10 mL Intracatheter PRN GNicholas Lose MD        PHYSICAL EXAMINATION: ECOG PERFORMANCE STATUS: 1 - Symptomatic but completely ambulatory  Vitals:   04/01/17 0956  BP: 123/78  Pulse: (!) 122  Resp: 18  Temp: 98.4 F (36.9 C)  SpO2: 100%   Filed Weights   04/01/17 0956  Weight: 155 lb 8 oz (70.5 kg)    GENERAL:alert, no distress and comfortable SKIN: skin color, texture, turgor are normal, no rashes or significant lesions EYES: normal, Conjunctiva are pink and non-injected, sclera clear OROPHARYNX:no exudate, no erythema and lips, buccal mucosa, and tongue normal  NECK: supple, thyroid normal size, non-tender, without nodularity LYMPH:  no palpable lymphadenopathy in the cervical, axillary or inguinal LUNGS: clear to auscultation and percussion with normal breathing effort HEART: regular rate & rhythm and no murmurs and no lower extremity edema ABDOMEN:abdomen soft, non-tender and normal bowel sounds MUSCULOSKELETAL:no cyanosis of digits and no clubbing  NEURO: alert & oriented x 3 with fluent speech, no focal motor/sensory deficits EXTREMITIES: No lower extremity edema  LABORATORY DATA:  I have reviewed the data as listed CMP Latest Ref Rng & Units 04/01/2017 03/25/2017 03/11/2017  Glucose 70 - 140 mg/dL 146(H) 224(H) 140  BUN 7 - 26 mg/dL _0 Creatinine 0.60 - 1.10 mg/dL 0.70 0.83 0.79  Sodium 136 - 145 mmol/L 140 140 144  Potassium 3.5 - 5.1 mmol/L 3.3(L) 3.5 3.8  Chloride 98 - 109 mmol/L 104 106 106  CO2 22 - 29 mmol/L _1 Calcium 8.4 - 10.4 mg/dL 9.5 9.1 9.8  Total Protein 6.4 - 8.3 g/dL 6.4 6.5 6.7  Total Bilirubin 0.2 - 1.2 mg/dL 0.2 0.3 0.3  Alkaline Phos 40 - 150 U/L 89 129 138  AST 5 - 34 U/L _2 ALT 0 - 55 U/L _3 Lab Results  Component Value Date   WBC 3.2 (L) 04/01/2017   HGB 11.4 (L) 04/01/2017   HCT 33.3 (L) 04/01/2017   MCV 90.9 04/01/2017   PLT 194 04/01/2017   NEUTROABS 2.4 04/01/2017    ASSESSMENT & PLAN:  Malignant neoplasm of upper-outer quadrant of right breast in female, estrogen receptor negative ([place] 12/29/2016: Palpable right breast lump for 6 months, mammogram revealed 6 cm mass at 1 o'clock position biopsy revealed grade 2 invasive ductal carcinoma ER 0%, PR  0%, HER-2 negative, 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 T3N0 stage IIIb  01/19/2017:Right breast 10 x 8.3 x 6.9 cm area of cancer involving all 4 quadrants with skin involvement extends from nipple to pectoralis muscle and involving right axillary lymph nodes, right internal mammary nodes  01/22/2017: CT CAP: 1.9 cm dome of the right hepatic lobe suspicious for liver metastases, 9 mm groundglass nodule in the right lung apex could be inflammation versus cancer Liver MRI: Lesion is benign involuted cyst  Treatment plan: 1.Neoadj chemo with dose dense AC foll by Taxoland carboplatin 2. Mastectomy 3. XRT 4.Consideration for participation inSWOG [phone] clinical trial -------------------------------------------------------------------------------------------------------------------------------------------------- Current treatment: Completed 4 cycles of dose dense Adriamycin Cytoxan, today is cycle 2 of weekly Taxol with  carboplatin (q 3 weeks) Echocardiogram 01/18/17: EF65-70%  Chemo toxicities: 1.Fatigue: Much worse since the last chemo 2.Alopecia Watching blood counts very closelyClosely monitoring for chemotherapy toxicities. Return to clinic [email or web address]  I spent 25 minutes talking to the patient of which more than half was spent in counseling and coordination of care.  No orders of the defined types were placed in this encounter.  The patient has a good understanding of the overall plan. she agrees with it. she will call with any problems that may develop before the next visit here.   Harriette Ohara, MD 04/01/17

## 2017-04-01 NOTE — Patient Instructions (Signed)
Implanted Port Home Guide An implanted port is a type of central line that is placed under the skin. Central lines are used to provide IV access when treatment or nutrition needs to be given through a person's veins. Implanted ports are used for [place]-term IV access. An implanted port may be placed because:  You need IV medicine that would be irritating to the small veins in your hands or arms.  You need [place]-term IV medicines, such as antibiotics.  You need IV nutrition for a [place] period.  You need frequent blood draws for lab tests.  You need dialysis.  Implanted ports are usually placed in the chest area, but they can also be placed in the upper arm, the abdomen, or the leg. An implanted port has two main parts:  Reservoir. The reservoir is round and will appear as a small, raised area under your skin. The reservoir is the part where a needle is inserted to give medicines or draw blood.  Catheter. The catheter is a thin, flexible tube that extends from the reservoir. The catheter is placed into a large vein. Medicine that is inserted into the reservoir goes into the catheter and then into the vein.  How will I care for my incision site? Do not get the incision site wet. Bathe or shower as directed by your health care provider. How is my port accessed? Special steps must be taken to access the port:  Before the port is accessed, a numbing cream can be placed on the skin. This helps numb the skin over the port site.  Your health care provider uses a sterile technique to access the port. ? Your health care provider must put on a mask and sterile gloves. ? The skin over your port is cleaned carefully with an antiseptic and allowed to dry. ? The port is gently pinched between sterile gloves, and a needle is inserted into the port.  Only "non-coring" port needles should be used to access the port. Once the port is accessed, a blood return should be checked. This helps ensure that the port  is in the vein and is not clogged.  If your port needs to remain accessed for a constant infusion, a clear (transparent) bandage will be placed over the needle site. The bandage and needle will need to be changed every week, or as directed by your health care provider.  Keep the bandage covering the needle clean and dry. Do not get it wet. Follow your health care provider's instructions on how to take a shower or bath while the port is accessed.  If your port does not need to stay accessed, no bandage is needed over the port.  What is flushing? Flushing helps keep the port from getting clogged. Follow your health care provider's instructions on how and when to flush the port. Ports are usually flushed with saline solution or a medicine called heparin. The need for flushing will depend on how the port is used.  If the port is used for intermittent medicines or blood draws, the port will need to be flushed: ? After medicines have been given. ? After blood has been drawn. ? As part of routine maintenance.  If a constant infusion is running, the port may not need to be flushed.  How [place] will my port stay implanted? The port can stay in for as [place] as your health care provider thinks it is needed. When it is time for the port to come out, surgery will be   done to remove it. The procedure is similar to the one performed when the port was put in. When should I seek immediate medical care? When you have an implanted port, you should seek immediate medical care if:  You notice a bad smell coming from the incision site.  You have swelling, redness, or drainage at the incision site.  You have more swelling or pain at the port site or the surrounding area.  You have a fever that is not controlled with medicine.  This information is not intended to replace advice given to you by your health care provider. Make sure you discuss any questions you have with your health care provider. Document  Released: 12/29/2004 Document Revised: 06/06/2015 Document Reviewed: 09/05/2012 Elsevier Interactive Patient Education  2017 [employer]  

## 2017-04-01 NOTE — Assessment & Plan Note (Signed)
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 T3N0 stage IIIb  01/19/2017:Right breast 10 x 8.3 x 6.9 cm area of cancer involving all 4 quadrants with skin involvement extends from nipple to pectoralis muscle and involving right axillary lymph nodes, right internal mammary nodes  01/22/2017: CT CAP: 1.9 cm dome of the right hepatic lobe suspicious for liver metastases, 9 mm groundglass nodule in the right lung apex could be inflammation versus cancer Liver MRI: Lesion is benign involuted cyst  Treatment plan: 1.Neoadj chemo with dose dense AC foll by Taxoland carboplatin 2. Mastectomy 3. XRT 4.Consideration for participation inSWOG [phone] clinical trial -------------------------------------------------------------------------------------------------------------------------------------------------- Current treatment: Completed 4 cycles of dose dense Adriamycin Cytoxan, today is cycle 2 of weekly Taxol with carboplatin (q 3 weeks) Echocardiogram 01/18/17: EF65-70%  Chemo toxicities: 1.Fatigue 2.Alopecia  Closely monitoring for chemotherapy toxicities. Return to clinic [email or web address]

## 2017-04-01 NOTE — Patient Instructions (Signed)
[facility] Discharge Instructions for Patients Receiving Chemotherapy  Today you received the following chemotherapy agents:  Taxol.  To help prevent nausea and vomiting after your treatment, we encourage you to take your nausea medication as directed.   If you develop nausea and vomiting that is not controlled by your nausea medication, call the clinic.   BELOW ARE SYMPTOMS THAT SHOULD BE REPORTED IMMEDIATELY:  *FEVER GREATER THAN 100.5 F  *CHILLS WITH OR WITHOUT FEVER  NAUSEA AND VOMITING THAT IS NOT CONTROLLED WITH YOUR NAUSEA MEDICATION  *UNUSUAL SHORTNESS OF BREATH  *UNUSUAL BRUISING OR BLEEDING  TENDERNESS IN MOUTH AND THROAT WITH OR WITHOUT PRESENCE OF ULCERS  *URINARY PROBLEMS  *BOWEL PROBLEMS  UNUSUAL RASH Items with * indicate a potential emergency and should be followed up as soon as possible.  Feel free to call the clinic should you have any questions or concerns. The clinic phone number is [phone].  Please show the CHEMO ALERT CARD at check-in to the Emergency Department and triage nurse.   

## 2017-04-08 ENCOUNTER — Inpatient Hospital Stay: Payer: Medicaid Other

## 2017-04-08 ENCOUNTER — Other Ambulatory Visit: Payer: Medicaid Other

## 2017-04-08 ENCOUNTER — Other Ambulatory Visit: Payer: Self-pay

## 2017-04-08 VITALS — BP 117/67 | HR 122 | Temp 98.7°F | Resp 18

## 2017-04-08 DIAGNOSIS — Z5111 Encounter for antineoplastic chemotherapy: Secondary | ICD-10-CM | POA: Diagnosis not present

## 2017-04-08 DIAGNOSIS — Z171 Estrogen receptor negative status [ER-]: Principal | ICD-10-CM

## 2017-04-08 DIAGNOSIS — C50411 Malignant neoplasm of upper-outer quadrant of right female breast: Secondary | ICD-10-CM

## 2017-04-08 DIAGNOSIS — Z95828 Presence of other vascular implants and grafts: Secondary | ICD-10-CM

## 2017-04-08 LAB — COMPREHENSIVE METABOLIC PANEL
ALBUMIN: 3.7 g/dL (ref 3.5–5.0)
ALK PHOS: 93 U/L (ref 40–150)
ALT: 35 U/L (ref 0–55)
AST: 18 U/L (ref 5–34)
Anion gap: 11 (ref 3–11)
BUN: 8 mg/dL (ref 7–26)
CALCIUM: 8.9 mg/dL (ref 8.4–10.4)
CO2: 25 mmol/L (ref 22–29)
CREATININE: 0.78 mg/dL (ref 0.60–1.10)
Chloride: 106 mmol/L (ref 98–109)
GFR calc Af Amer: >60 mL/min (ref >60)
GFR calc non Af Amer: >60 mL/min (ref >60)
GLUCOSE: 199 mg/dL — AB (ref 70–140)
Potassium: 3.3 mmol/L — ABNORMAL LOW (ref 3.5–5.1)
Sodium: 142 mmol/L (ref 136–145)
Total Bilirubin: 0.4 mg/dL (ref 0.2–1.2)
Total Protein: 6.2 g/dL — ABNORMAL LOW (ref 6.4–8.3)

## 2017-04-08 LAB — CBC WITH DIFFERENTIAL/PLATELET
Basophils Absolute: 0 10*3/uL (ref 0.0–0.1)
Basophils Relative: 1 %
EOS ABS: 0 10*3/uL (ref 0.0–0.5)
EOS PCT: 2 %
HCT: 28.6 % — ABNORMAL LOW (ref 34.8–46.6)
HEMOGLOBIN: 9.8 g/dL — AB (ref 11.6–15.9)
LYMPHS ABS: 0.4 10*3/uL — AB (ref 0.9–3.3)
Lymphocytes Relative: 18 %
MCH: 31.3 pg (ref 25.1–34.0)
MCHC: 34.2 g/dL (ref 31.5–36.0)
MCV: 91.5 fL (ref 79.5–101.0)
MONOS PCT: 14 %
Monocytes Absolute: 0.3 10*3/uL (ref 0.1–0.9)
Neutro Abs: 1.3 10*3/uL — ABNORMAL LOW (ref 1.5–6.5)
Neutrophils Relative %: 65 %
PLATELETS: 65 10*3/uL — AB (ref 145–400)
RBC: 3.13 MIL/uL — ABNORMAL LOW (ref 3.70–5.45)
RDW: 20.4 % — ABNORMAL HIGH (ref 11.2–14.5)
WBC: 2 10*3/uL — ABNORMAL LOW (ref 3.9–10.3)

## 2017-04-08 MED ORDER — HEPARIN SOD (PORK) LOCK FLUSH 100 UNIT/ML IV SOLN
500.0000 [IU] | Freq: Once | INTRAVENOUS | Status: AC | PRN
  Administered 2017-04-08: 500 [IU] via INTRAVENOUS
  Filled 2017-04-08: qty 5

## 2017-04-08 MED ORDER — MAGIC MOUTHWASH W/LIDOCAINE: 5.0000 mL | Freq: Two times a day (BID) | ORAL | 0 refills | Status: DC | PRN

## 2017-04-08 MED ORDER — SODIUM CHLORIDE 0.9% FLUSH
10.0000 mL | INTRAVENOUS | Status: DC | PRN
  Administered 2017-04-08: 10 mL via INTRAVENOUS
  Filled 2017-04-08: qty 10

## 2017-04-08 NOTE — Patient Instructions (Signed)
[facility] Discharge Instructions for Patients Receiving Chemotherapy  Today you received the following chemotherapy agents:  Taxol.  To help prevent nausea and vomiting after your treatment, we encourage you to take your nausea medication as directed.   If you develop nausea and vomiting that is not controlled by your nausea medication, call the clinic.   BELOW ARE SYMPTOMS THAT SHOULD BE REPORTED IMMEDIATELY:  *FEVER GREATER THAN 100.5 F  *CHILLS WITH OR WITHOUT FEVER  NAUSEA AND VOMITING THAT IS NOT CONTROLLED WITH YOUR NAUSEA MEDICATION  *UNUSUAL SHORTNESS OF BREATH  *UNUSUAL BRUISING OR BLEEDING  TENDERNESS IN MOUTH AND THROAT WITH OR WITHOUT PRESENCE OF ULCERS  *URINARY PROBLEMS  *BOWEL PROBLEMS  UNUSUAL RASH Items with * indicate a potential emergency and should be followed up as soon as possible.  Feel free to call the clinic should you have any questions or concerns. The clinic phone number is [phone].  Please show the CHEMO ALERT CARD at check-in to the Emergency Department and triage nurse.   

## 2017-04-08 NOTE — Progress Notes (Signed)
No treatment today per MD Gudena. Pt to follow-up next week at her appointment. Magic mouthwash prescribed per MD Gudena.

## 2017-04-15 ENCOUNTER — Inpatient Hospital Stay (HOSPITAL_BASED_OUTPATIENT_CLINIC_OR_DEPARTMENT_OTHER): Payer: Medicaid Other | Admitting: Hematology and Oncology

## 2017-04-15 ENCOUNTER — Inpatient Hospital Stay: Payer: Medicaid Other

## 2017-04-15 ENCOUNTER — Ambulatory Visit: Payer: Medicaid Other

## 2017-04-15 ENCOUNTER — Encounter: Payer: Self-pay | Admitting: *Deleted

## 2017-04-15 ENCOUNTER — Encounter: Payer: Self-pay | Admitting: General Practice

## 2017-04-15 ENCOUNTER — Inpatient Hospital Stay: Payer: Medicaid Other | Attending: Radiation Oncology

## 2017-04-15 DIAGNOSIS — C50411 Malignant neoplasm of upper-outer quadrant of right female breast: Secondary | ICD-10-CM

## 2017-04-15 DIAGNOSIS — K5900 Constipation, unspecified: Secondary | ICD-10-CM | POA: Insufficient documentation

## 2017-04-15 DIAGNOSIS — Z5111 Encounter for antineoplastic chemotherapy: Secondary | ICD-10-CM | POA: Diagnosis not present

## 2017-04-15 DIAGNOSIS — T451X5S Adverse effect of antineoplastic and immunosuppressive drugs, sequela: Secondary | ICD-10-CM | POA: Diagnosis not present

## 2017-04-15 DIAGNOSIS — D6959 Other secondary thrombocytopenia: Secondary | ICD-10-CM | POA: Insufficient documentation

## 2017-04-15 DIAGNOSIS — D702 Other drug-induced agranulocytosis: Secondary | ICD-10-CM | POA: Diagnosis not present

## 2017-04-15 DIAGNOSIS — Z006 Encounter for examination for normal comparison and control in clinical research program: Secondary | ICD-10-CM | POA: Insufficient documentation

## 2017-04-15 DIAGNOSIS — Z79899 Other long term (current) drug therapy: Secondary | ICD-10-CM | POA: Diagnosis not present

## 2017-04-15 DIAGNOSIS — R5383 Other fatigue: Secondary | ICD-10-CM | POA: Diagnosis not present

## 2017-04-15 DIAGNOSIS — Z171 Estrogen receptor negative status [ER-]: Secondary | ICD-10-CM

## 2017-04-15 DIAGNOSIS — K769 Liver disease, unspecified: Secondary | ICD-10-CM | POA: Insufficient documentation

## 2017-04-15 DIAGNOSIS — Z95828 Presence of other vascular implants and grafts: Secondary | ICD-10-CM

## 2017-04-15 LAB — CBC WITH DIFFERENTIAL/PLATELET
Basophils Absolute: 0 10*3/uL (ref 0.0–0.1)
Basophils Relative: 1 %
EOS PCT: 3 %
Eosinophils Absolute: 0.1 10*3/uL (ref 0.0–0.5)
HCT: 30.1 % — ABNORMAL LOW (ref 34.8–46.6)
Hemoglobin: 10.1 g/dL — ABNORMAL LOW (ref 11.6–15.9)
LYMPHS ABS: 0.8 10*3/uL — AB (ref 0.9–3.3)
LYMPHS PCT: 24 %
MCH: 32.4 pg (ref 25.1–34.0)
MCHC: 33.6 g/dL (ref 31.5–36.0)
MCV: 96.5 fL (ref 79.5–101.0)
MONO ABS: 0.2 10*3/uL (ref 0.1–0.9)
MONOS PCT: 6 %
Neutro Abs: 2.1 10*3/uL (ref 1.5–6.5)
Neutrophils Relative %: 66 %
PLATELETS: 113 10*3/uL — AB (ref 145–400)
RBC: 3.12 MIL/uL — AB (ref 3.70–5.45)
RDW: 21.1 % — ABNORMAL HIGH (ref 11.2–14.5)
WBC: 3.2 10*3/uL — AB (ref 3.9–10.3)

## 2017-04-15 LAB — COMPREHENSIVE METABOLIC PANEL
ALBUMIN: 3.6 g/dL (ref 3.5–5.0)
ALT: 35 U/L (ref 0–55)
AST: 22 U/L (ref 5–34)
Alkaline Phosphatase: 115 U/L (ref 40–150)
Anion gap: 9 (ref 3–11)
BUN: 13 mg/dL (ref 7–26)
CHLORIDE: 107 mmol/L (ref 98–109)
CO2: 25 mmol/L (ref 22–29)
Calcium: 9.3 mg/dL (ref 8.4–10.4)
Creatinine, Ser: 0.75 mg/dL (ref 0.60–1.10)
GFR calc Af Amer: >60 mL/min (ref >60)
GFR calc non Af Amer: >60 mL/min (ref >60)
GLUCOSE: 201 mg/dL — AB (ref 70–140)
POTASSIUM: 3.5 mmol/L (ref 3.5–5.1)
Sodium: 141 mmol/L (ref 136–145)
Total Bilirubin: 0.5 mg/dL (ref 0.2–1.2)
Total Protein: 6.5 g/dL (ref 6.4–8.3)

## 2017-04-15 MED ORDER — DIPHENHYDRAMINE HCL 50 MG/ML IJ SOLN
INTRAMUSCULAR | Status: AC
  Filled 2017-04-15: qty 1

## 2017-04-15 MED ORDER — FAMOTIDINE IN NACL 20-0.9 MG/50ML-% IV SOLN
20.0000 mg | Freq: Once | INTRAVENOUS | Status: AC
  Administered 2017-04-15: 20 mg via INTRAVENOUS

## 2017-04-15 MED ORDER — SODIUM CHLORIDE 0.9% FLUSH
10.0000 mL | INTRAVENOUS | Status: DC | PRN
Start: 2017-04-15 — End: 2017-04-15
  Administered 2017-04-15: 10 mL via INTRAVENOUS
  Filled 2017-04-15: qty 10

## 2017-04-15 MED ORDER — SODIUM CHLORIDE 0.9 % IV SOLN
Freq: Once | INTRAVENOUS | Status: AC
  Administered 2017-04-15: 10:00:00 via INTRAVENOUS

## 2017-04-15 MED ORDER — DIPHENHYDRAMINE HCL 50 MG/ML IJ SOLN
50.0000 mg | Freq: Once | INTRAMUSCULAR | Status: AC
Start: 2017-04-15 — End: 2017-04-15
  Administered 2017-04-15: 50 mg via INTRAVENOUS

## 2017-04-15 MED ORDER — DEXAMETHASONE SODIUM PHOSPHATE 100 MG/10ML IJ SOLN
20.0000 mg | Freq: Once | INTRAMUSCULAR | Status: AC
  Administered 2017-04-15: 20 mg via INTRAVENOUS
  Filled 2017-04-15: qty 2

## 2017-04-15 MED ORDER — SODIUM CHLORIDE 0.9 % IV SOLN
65.0000 mg/m2 | Freq: Once | INTRAVENOUS | Status: AC
  Administered 2017-04-15: 114 mg via INTRAVENOUS
  Filled 2017-04-15: qty 19

## 2017-04-15 MED ORDER — FAMOTIDINE IN NACL 20-0.9 MG/50ML-% IV SOLN
INTRAVENOUS | Status: AC
  Filled 2017-04-15: qty 50

## 2017-04-15 MED ORDER — HEPARIN SOD (PORK) LOCK FLUSH 100 UNIT/ML IV SOLN
500.0000 [IU] | Freq: Once | INTRAVENOUS | Status: AC | PRN
  Administered 2017-04-15: 500 [IU]
  Filled 2017-04-15: qty 5

## 2017-04-15 MED ORDER — SODIUM CHLORIDE 0.9% FLUSH
10.0000 mL | INTRAVENOUS | Status: DC | PRN
  Administered 2017-04-15: 10 mL
  Filled 2017-04-15: qty 10

## 2017-04-15 NOTE — Assessment & Plan Note (Signed)
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 T3N0 stage IIIb  01/19/2017:Right breast 10 x 8.3 x 6.9 cm area of cancer involving all 4 quadrants with skin involvement extends from nipple to pectoralis muscle and involving right axillary lymph nodes, right internal mammary nodes  01/22/2017: CT CAP: 1.9 cm dome of the right hepatic lobe suspicious for liver metastases, 9 mm groundglass nodule in the right lung apex could be inflammation versus cancer Liver MRI: Lesion is benign involuted cyst  Treatment plan: 1.Neoadj chemo with dose dense AC foll by Taxoland carboplatin 2. Mastectomy 3. XRT 4.Consideration for participation inSWOG [phone] clinical trial -------------------------------------------------------------------------------------------------------------------------------------------------- Current treatment:Completed 4 cycles ofdose dense Adriamycin Cytoxan, today is cycle 4 of weekly Taxolwith carboplatin (q 3 weeks) Echocardiogram 01/18/17: EF65-70%  Chemo toxicities: 1.Fatigue: Much worse since the last chemo 2.Alopecia Watching blood counts very closelyClosely monitoring for chemotherapy toxicities. Return to clinic [email or web address]

## 2017-04-15 NOTE — Progress Notes (Signed)
Patient Care Team: [name], MD as PCP - General (Family Medicine)  DIAGNOSIS:  Encounter Diagnosis  Name Primary?  . 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  T3N0 stage IIIb      01/19/2017 Breast MRI    Right breast 10 x 8.3 x 6.9 cm area of cancer involving all 4 quadrants with skin involvement extends from nipple to pectoralis muscle and involving right axillary lymph nodes, right internal mammary nodes      01/22/2017 Imaging    CT chest abdomen pelvis reveals a 6 cm right breast mass, mild right axillary lymphadenopathy, 1.9 cm peripheral enhancing lesion in the dome of the right hepatic lobe, 9 mm groundglass nodule right lung apex      01/28/2017 -  Neo-Adjuvant Chemotherapy    Dose dense Adriamycin and Cytoxan  followed with Taxol and carboplatin       CHIEF COMPLIANT: Cycle 3 Taxol  INTERVAL HISTORY: Kathryn [name] is a 57 year old with above-mentioned history of right breast cancer currently neoadjuvant chemotherapy and today is cycle 3 of Taxol.  Last week her blood counts were not adequate and she did not receive chemo.  She is here to recheck her blood work and to see if she can get chemotherapy.  She has been feeling fairly well.  Denies any nausea vomiting or fevers or chills.  REVIEW OF SYSTEMS:   Constitutional: Denies fevers, chills or abnormal weight loss Eyes: Denies blurriness of vision Ears, nose, mouth, throat, and face: Denies  mucositis or sore throat Respiratory: Denies cough, dyspnea or wheezes Cardiovascular: Denies palpitation, chest discomfort Gastrointestinal:  Denies nausea, heartburn or change in bowel habits Skin: Denies abnormal skin rashes Lymphatics: Denies new lymphadenopathy or easy bruising Neurological:Denies numbness, tingling or new weaknesses Behavioral/Psych: Mood is stable, no new changes  Extremities: No lower extremity edema All other systems were reviewed with the patient and are negative.  I have reviewed the past medical history, past surgical history, social history and family history with the patient and they are unchanged from previous note.  ALLERGIES:  has No Known Allergies.  MEDICATIONS:  Current Outpatient Medications  Medication Sig Dispense Refill  . cholecalciferol (VITAMIN D) 1000 UNITS tablet Take 1,000 Units by mouth daily.    . clonazePAM (KLONOPIN) 0.5 MG tablet Take 1 tablet (0.5 mg total) by mouth 3 (three) times daily as needed for anxiety. 90 tablet 5  . cyclobenzaprine (FLEXERIL) 10 MG tablet Take 10 mg by mouth 3 (three) times daily as needed for muscle spasms.    Marland Kitchen lidocaine-prilocaine (EMLA) cream Apply to affected area once (Patient taking differently: Apply 1 application topically 3 times/day as needed-between meals & bedtime. Apply to affected area once) 30 g 3  . LORazepam (ATIVAN) 0.5 MG tablet Take 1 tablet (0.5 mg total) by mouth at bedtime as needed (Nausea or vomiting). 30 tablet 0  . magic mouthwash w/lidocaine SOLN Take 5 mLs by mouth 2 (two) times daily as needed for mouth pain.  30 mL 0  . Misc Natural Products (ACAI+SUPERFRUIT/GREEN TEA) TABS Take 1 tablet by mouth daily.    . Multiple Vitamin (MULITIVITAMIN WITH MINERALS) TABS Take 1 tablet by mouth daily. Plus vit C 584m    . naproxen (NAPROSYN) 500 MG tablet Take 500 mg by mouth every 12 (twelve) hours as needed.    . ondansetron (ZOFRAN) 8 MG tablet Take 1 tablet (8 mg total) by mouth 2 (two)  times daily as needed. Start on the third day after chemotherapy. 30 tablet 1  . prochlorperazine (COMPAZINE) 10 MG tablet Take 1 tablet (10 mg total) by mouth every 6 (six) hours as needed (Nausea or vomiting). 30 tablet 1   No current facility-administered medications for this visit.    Facility-Administered Medications Ordered in Other Visits  Medication Dose Route Frequency Provider Last Rate Last Dose  . sodium chloride flush (NS) 0.9 % injection 10 mL  10 mL Intracatheter PRN GNicholas Lose MD   10 mL at 04/15/17 1252    PHYSICAL EXAMINATION: ECOG PERFORMANCE STATUS: 1 - Symptomatic but completely ambulatory  Vitals:   04/15/17 0846  BP: (!) 145/75  Pulse: (!) 120  Resp: 18  Temp: (!) 97.5 F (36.4 C)  SpO2: 100%   Filed Weights   04/15/17 0846  Weight: 155 lb 9.6 oz (70.6 kg)    GENERAL:alert, no distress and comfortable SKIN: skin color, texture, turgor are normal, no rashes or significant lesions EYES: normal, Conjunctiva are pink and non-injected, sclera clear OROPHARYNX:no exudate, no erythema and lips, buccal mucosa, and tongue normal  NECK: supple, thyroid normal size, non-tender, without nodularity LYMPH:  no palpable lymphadenopathy in the cervical, axillary or inguinal LUNGS: clear to auscultation and percussion with normal breathing effort HEART: regular rate & rhythm and no murmurs and no lower extremity edema ABDOMEN:abdomen soft, non-tender and normal bowel sounds MUSCULOSKELETAL:no cyanosis of digits and no clubbing  NEURO: alert & oriented x 3 with fluent speech, no focal motor/sensory deficits EXTREMITIES: No lower extremity edema  LABORATORY DATA:  I have reviewed the data as listed CMP Latest Ref Rng & Units 04/15/2017 04/08/2017 04/01/2017  Glucose 70 - 140 mg/dL 201(H) 199(H) 146(H)  BUN 7 - 26 mg/dL _0 Creatinine 0.60 - 1.10 mg/dL 0.75 0.78 0.70  Sodium 136 - 145 mmol/L 141 142 140  Potassium 3.5 - 5.1 mmol/L 3.5 3.3(L) 3.3(L)  Chloride 98  - 109 mmol/L 107 106 104  CO2 22 - 29 mmol/L _1 Calcium 8.4 - 10.4 mg/dL 9.3 8.9 9.5  Total Protein 6.4 - 8.3 g/dL 6.5 6.2(L) 6.4  Total Bilirubin 0.2 - 1.2 mg/dL 0.5 0.4 0.2  Alkaline Phos 40 - 150 U/L 115 93 89  AST 5 - 34 U/L _2 ALT 0 - 55 U/L 35 35 30    Lab Results  Component Value Date   WBC 3.2 (L) 04/15/2017   HGB 10.1 (L) 04/15/2017   HCT 30.1 (L) 04/15/2017   MCV 96.5 04/15/2017   PLT 113 (L) 04/15/2017   NEUTROABS 2.1 04/15/2017    ASSESSMENT & PLAN:  Malignant neoplasm of upper-outer quadrant of right breast in female, estrogen receptor negative ([place] 12/29/2016: Palpable right breast lump for 6 months, mammogram revealed 6 cm mass at 1 o'clock position 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 T3N0  stage IIIb  01/19/2017:Right breast 10 x 8.3 x 6.9 cm area of cancer involving all 4 quadrants with skin involvement extends from nipple to pectoralis muscle and involving right axillary lymph nodes, right internal mammary nodes  01/22/2017: CT CAP: 1.9 cm dome of the right hepatic lobe suspicious for liver metastases, 9 mm groundglass nodule in the right lung apex could be inflammation versus cancer Liver MRI: Lesion is benign involuted cyst  Treatment plan: 1.Neoadj chemo with dose dense AC foll by Taxoland carboplatin 2. Mastectomy 3. XRT 4.Consideration for participation inSWOG [phone] clinical trial -------------------------------------------------------------------------------------------------------------------------------------------------- Current treatment:Completed 4 cycles ofdose dense Adriamycin Cytoxan, today is cycle 4 of weekly Taxolwith carboplatin (q 3 weeks) Echocardiogram 01/18/17: EF65-70%  Chemo toxicities: 1.Fatigue: Much worse since the last chemo 2.Alopecia 3.  Neutropenia and thrombocytopenia: We had to hold cycle 3 and postpone her treatment.  I will reduce the dosage of chemotherapy with cycle 3.  Watching blood counts very closely Closely monitoring for chemotherapy toxicities. Return to clinic in1weeksforcycle4      No orders of the defined types were placed in this encounter.  The patient has a good understanding of the overall plan. she agrees with it. she will call with any problems that may develop before the next visit here.   Harriette Ohara, MD 04/15/17

## 2017-04-15 NOTE — Progress Notes (Signed)
[facility] CSW Progress Note  Per nurse navigator, patient has questions about applying for disability.  Asked CSW to speak w her.  Spoke w patient in infusion room.  Explained briefly process for applying w IT trainer.  Advised that patient can apply using lawyer or can be referred to [facility].  Unclear whether [facility] can assist.  Patient wants information from [facility] re disability process.  Wants to know if disability will affect her Medicaid.  [facility]the[facility] Sia staff messaged and asked to speak w patient as patient has [employer].  Referral made to [facility] who will contact patient directly.  Edwyna Shell, LCSW Clinical Social Worker Phone:  [phone]

## 2017-04-15 NOTE — Patient Instructions (Signed)
[facility] Discharge Instructions for Patients Receiving Chemotherapy  Today you received the following chemotherapy agents:  Taxol.  To help prevent nausea and vomiting after your treatment, we encourage you to take your nausea medication as directed.   If you develop nausea and vomiting that is not controlled by your nausea medication, call the clinic.   BELOW ARE SYMPTOMS THAT SHOULD BE REPORTED IMMEDIATELY:  *FEVER GREATER THAN 100.5 F  *CHILLS WITH OR WITHOUT FEVER  NAUSEA AND VOMITING THAT IS NOT CONTROLLED WITH YOUR NAUSEA MEDICATION  *UNUSUAL SHORTNESS OF BREATH  *UNUSUAL BRUISING OR BLEEDING  TENDERNESS IN MOUTH AND THROAT WITH OR WITHOUT PRESENCE OF ULCERS  *URINARY PROBLEMS  *BOWEL PROBLEMS  UNUSUAL RASH Items with * indicate a potential emergency and should be followed up as soon as possible.  Feel free to call the clinic should you have any questions or concerns. The clinic phone number is [phone].  Please show the CHEMO ALERT CARD at check-in to the Emergency Department and triage nurse.   

## 2017-04-22 ENCOUNTER — Inpatient Hospital Stay: Payer: Medicaid Other

## 2017-04-22 VITALS — BP 151/85 | HR 128 | Temp 98.0°F | Resp 16 | Wt 155.0 lb

## 2017-04-22 DIAGNOSIS — C50411 Malignant neoplasm of upper-outer quadrant of right female breast: Secondary | ICD-10-CM

## 2017-04-22 DIAGNOSIS — Z171 Estrogen receptor negative status [ER-]: Principal | ICD-10-CM

## 2017-04-22 DIAGNOSIS — Z95828 Presence of other vascular implants and grafts: Secondary | ICD-10-CM

## 2017-04-22 DIAGNOSIS — Z5111 Encounter for antineoplastic chemotherapy: Secondary | ICD-10-CM | POA: Diagnosis not present

## 2017-04-22 LAB — COMPREHENSIVE METABOLIC PANEL
ALK PHOS: 110 U/L (ref 40–150)
ALT: 33 U/L (ref 0–55)
ANION GAP: 12 — AB (ref 3–11)
AST: 19 U/L (ref 5–34)
Albumin: 3.6 g/dL (ref 3.5–5.0)
BUN: 10 mg/dL (ref 7–26)
CALCIUM: 9.2 mg/dL (ref 8.4–10.4)
CO2: 24 mmol/L (ref 22–29)
Chloride: 105 mmol/L (ref 98–109)
Creatinine, Ser: 0.82 mg/dL (ref 0.60–1.10)
GFR calc non Af Amer: >60 mL/min (ref >60)
Glucose, Bld: 208 mg/dL — ABNORMAL HIGH (ref 70–140)
Potassium: 3.7 mmol/L (ref 3.5–5.1)
SODIUM: 141 mmol/L (ref 136–145)
TOTAL PROTEIN: 6.5 g/dL (ref 6.4–8.3)
Total Bilirubin: 0.4 mg/dL (ref 0.2–1.2)

## 2017-04-22 LAB — CBC WITH DIFFERENTIAL/PLATELET
Basophils Absolute: 0 10*3/uL (ref 0.0–0.1)
Basophils Relative: 0 %
EOS ABS: 0.1 10*3/uL (ref 0.0–0.5)
EOS PCT: 3 %
HCT: 32.1 % — ABNORMAL LOW (ref 34.8–46.6)
HEMOGLOBIN: 10.6 g/dL — AB (ref 11.6–15.9)
LYMPHS ABS: 0.6 10*3/uL — AB (ref 0.9–3.3)
Lymphocytes Relative: 13 %
MCH: 32.7 pg (ref 25.1–34.0)
MCHC: 33 g/dL (ref 31.5–36.0)
MCV: 99.1 fL (ref 79.5–101.0)
Monocytes Absolute: 0.2 10*3/uL (ref 0.1–0.9)
Monocytes Relative: 4 %
NEUTROS PCT: 80 %
Neutro Abs: 3.5 10*3/uL (ref 1.5–6.5)
Platelets: 263 10*3/uL (ref 145–400)
RBC: 3.24 MIL/uL — ABNORMAL LOW (ref 3.70–5.45)
RDW: 20.7 % — ABNORMAL HIGH (ref 11.2–14.5)
WBC: 4.3 10*3/uL (ref 3.9–10.3)

## 2017-04-22 MED ORDER — SODIUM CHLORIDE 0.9 % IV SOLN
65.0000 mg/m2 | Freq: Once | INTRAVENOUS | Status: AC
  Administered 2017-04-22: 114 mg via INTRAVENOUS
  Filled 2017-04-22: qty 19

## 2017-04-22 MED ORDER — SODIUM CHLORIDE 0.9 % IV SOLN
Freq: Once | INTRAVENOUS | Status: AC
  Administered 2017-04-22: 11:00:00 via INTRAVENOUS
  Filled 2017-04-22: qty 5

## 2017-04-22 MED ORDER — SODIUM CHLORIDE 0.9% FLUSH
10.0000 mL | INTRAVENOUS | Status: DC | PRN
  Administered 2017-04-22: 10 mL via INTRAVENOUS
  Filled 2017-04-22: qty 10

## 2017-04-22 MED ORDER — PALONOSETRON HCL INJECTION 0.25 MG/5ML
0.2500 mg | Freq: Once | INTRAVENOUS | Status: AC
  Administered 2017-04-22: 0.25 mg via INTRAVENOUS

## 2017-04-22 MED ORDER — HEPARIN SOD (PORK) LOCK FLUSH 100 UNIT/ML IV SOLN
500.0000 [IU] | Freq: Once | INTRAVENOUS | Status: AC | PRN
  Administered 2017-04-22: 500 [IU]
  Filled 2017-04-22: qty 5

## 2017-04-22 MED ORDER — FAMOTIDINE IN NACL 20-0.9 MG/50ML-% IV SOLN
INTRAVENOUS | Status: AC
  Filled 2017-04-22: qty 50

## 2017-04-22 MED ORDER — DIPHENHYDRAMINE HCL 50 MG/ML IJ SOLN
50.0000 mg | Freq: Once | INTRAMUSCULAR | Status: AC
  Administered 2017-04-22: 50 mg via INTRAVENOUS

## 2017-04-22 MED ORDER — SODIUM CHLORIDE 0.9 % IV SOLN
Freq: Once | INTRAVENOUS | Status: AC
  Administered 2017-04-22: 09:00:00 via INTRAVENOUS

## 2017-04-22 MED ORDER — FAMOTIDINE IN NACL 20-0.9 MG/50ML-% IV SOLN
20.0000 mg | Freq: Once | INTRAVENOUS | Status: AC
  Administered 2017-04-22: 20 mg via INTRAVENOUS

## 2017-04-22 MED ORDER — DIPHENHYDRAMINE HCL 50 MG/ML IJ SOLN
INTRAMUSCULAR | Status: AC
  Filled 2017-04-22: qty 1

## 2017-04-22 MED ORDER — PALONOSETRON HCL INJECTION 0.25 MG/5ML
INTRAVENOUS | Status: AC
  Filled 2017-04-22: qty 5

## 2017-04-22 MED ORDER — SODIUM CHLORIDE 0.9% FLUSH
10.0000 mL | INTRAVENOUS | Status: DC | PRN
  Administered 2017-04-22: 10 mL
  Filled 2017-04-22: qty 10

## 2017-04-22 MED ORDER — SODIUM CHLORIDE 0.9 % IV SOLN
559.0000 mg | Freq: Once | INTRAVENOUS | Status: AC
  Administered 2017-04-22: 560 mg via INTRAVENOUS
  Filled 2017-04-22: qty 56

## 2017-04-22 NOTE — Patient Instructions (Addendum)
[facility] Discharge Instructions for Patients Receiving Chemotherapy  Today you received the following chemotherapy agents: Paclitaxel (Taxol) and Carboplatin (Paraplatin)  To help prevent nausea and vomiting after your treatment, we encourage you to take your nausea medication as directed. Received Aloxi during treatment today-->Take Compazine (not Zofran) for the next 3 days as needed.    If you develop nausea and vomiting that is not controlled by your nausea medication, call the clinic.   BELOW ARE SYMPTOMS THAT SHOULD BE REPORTED IMMEDIATELY:  *FEVER GREATER THAN 100.5 F  *CHILLS WITH OR WITHOUT FEVER  NAUSEA AND VOMITING THAT IS NOT CONTROLLED WITH YOUR NAUSEA MEDICATION  *UNUSUAL SHORTNESS OF BREATH  *UNUSUAL BRUISING OR BLEEDING  TENDERNESS IN MOUTH AND THROAT WITH OR WITHOUT PRESENCE OF ULCERS  *URINARY PROBLEMS  *BOWEL PROBLEMS  UNUSUAL RASH Items with * indicate a potential emergency and should be followed up as soon as possible.  Feel free to call the clinic should you have any questions or concerns. The clinic phone number is [phone].  Please show the CHEMO ALERT CARD at check-in to the Emergency Department and triage nurse.   

## 2017-04-29 ENCOUNTER — Inpatient Hospital Stay: Payer: Medicaid Other

## 2017-04-29 ENCOUNTER — Encounter: Payer: Self-pay | Admitting: *Deleted

## 2017-04-29 ENCOUNTER — Inpatient Hospital Stay (HOSPITAL_BASED_OUTPATIENT_CLINIC_OR_DEPARTMENT_OTHER): Payer: Medicaid Other | Admitting: Adult Health

## 2017-04-29 ENCOUNTER — Encounter: Payer: Self-pay | Admitting: General Practice

## 2017-04-29 ENCOUNTER — Encounter: Payer: Self-pay | Admitting: Adult Health

## 2017-04-29 VITALS — BP 129/77 | HR 128 | Temp 98.7°F | Resp 18 | Ht 62.0 in | Wt 150.8 lb

## 2017-04-29 DIAGNOSIS — K5900 Constipation, unspecified: Secondary | ICD-10-CM | POA: Diagnosis not present

## 2017-04-29 DIAGNOSIS — Z171 Estrogen receptor negative status [ER-]: Secondary | ICD-10-CM | POA: Diagnosis not present

## 2017-04-29 DIAGNOSIS — Z95828 Presence of other vascular implants and grafts: Secondary | ICD-10-CM

## 2017-04-29 DIAGNOSIS — R5383 Other fatigue: Secondary | ICD-10-CM

## 2017-04-29 DIAGNOSIS — C50411 Malignant neoplasm of upper-outer quadrant of right female breast: Secondary | ICD-10-CM

## 2017-04-29 DIAGNOSIS — Z79899 Other long term (current) drug therapy: Secondary | ICD-10-CM

## 2017-04-29 DIAGNOSIS — K769 Liver disease, unspecified: Secondary | ICD-10-CM | POA: Diagnosis not present

## 2017-04-29 DIAGNOSIS — E876 Hypokalemia: Secondary | ICD-10-CM

## 2017-04-29 DIAGNOSIS — Z5111 Encounter for antineoplastic chemotherapy: Secondary | ICD-10-CM | POA: Diagnosis not present

## 2017-04-29 LAB — CBC WITH DIFFERENTIAL/PLATELET
Basophils Absolute: 0 10*3/uL (ref 0.0–0.1)
Basophils Relative: 1 %
Eosinophils Absolute: 0 10*3/uL (ref 0.0–0.5)
Eosinophils Relative: 1 %
HEMATOCRIT: 30.8 % — AB (ref 34.8–46.6)
HEMOGLOBIN: 10.6 g/dL — AB (ref 11.6–15.9)
LYMPHS ABS: 0.4 10*3/uL — AB (ref 0.9–3.3)
Lymphocytes Relative: 21 %
MCH: 33.2 pg (ref 25.1–34.0)
MCHC: 34.4 g/dL (ref 31.5–36.0)
MCV: 96.5 fL (ref 79.5–101.0)
MONO ABS: 0.2 10*3/uL (ref 0.1–0.9)
MONOS PCT: 12 %
NEUTROS ABS: 1.2 10*3/uL — AB (ref 1.5–6.5)
NEUTROS PCT: 65 %
Platelets: 227 10*3/uL (ref 145–400)
RBC: 3.19 MIL/uL — ABNORMAL LOW (ref 3.70–5.45)
RDW: 20.8 % — AB (ref 11.2–14.5)
WBC: 1.9 10*3/uL — ABNORMAL LOW (ref 3.9–10.3)

## 2017-04-29 LAB — COMPREHENSIVE METABOLIC PANEL
ALBUMIN: 3.7 g/dL (ref 3.5–5.0)
ALK PHOS: 106 U/L (ref 40–150)
ALT: 28 U/L (ref 0–55)
ANION GAP: 12 — AB (ref 3–11)
AST: 22 U/L (ref 5–34)
BUN: 13 mg/dL (ref 7–26)
CHLORIDE: 100 mmol/L (ref 98–109)
CO2: 25 mmol/L (ref 22–29)
CREATININE: 0.83 mg/dL (ref 0.60–1.10)
Calcium: 9.6 mg/dL (ref 8.4–10.4)
GFR calc non Af Amer: >60 mL/min (ref >60)
GLUCOSE: 190 mg/dL — AB (ref 70–140)
Potassium: 3 mmol/L — CL (ref 3.5–5.1)
SODIUM: 137 mmol/L (ref 136–145)
Total Bilirubin: 0.3 mg/dL (ref 0.2–1.2)
Total Protein: 6.7 g/dL (ref 6.4–8.3)

## 2017-04-29 MED ORDER — DIPHENHYDRAMINE HCL 50 MG/ML IJ SOLN
INTRAMUSCULAR | Status: AC
  Filled 2017-04-29: qty 1

## 2017-04-29 MED ORDER — FAMOTIDINE IN NACL 20-0.9 MG/50ML-% IV SOLN
INTRAVENOUS | Status: AC
  Filled 2017-04-29: qty 50

## 2017-04-29 MED ORDER — DIPHENHYDRAMINE HCL 50 MG/ML IJ SOLN
50.0000 mg | Freq: Once | INTRAMUSCULAR | Status: AC
Start: 2017-04-29 — End: 2017-04-29
  Administered 2017-04-29: 50 mg via INTRAVENOUS

## 2017-04-29 MED ORDER — SODIUM CHLORIDE 0.9% FLUSH
10.0000 mL | INTRAVENOUS | Status: DC | PRN
  Administered 2017-04-29: 10 mL via INTRAVENOUS
  Filled 2017-04-29: qty 10

## 2017-04-29 MED ORDER — SODIUM CHLORIDE 0.9 % IV SOLN
65.0000 mg/m2 | Freq: Once | INTRAVENOUS | Status: AC
  Administered 2017-04-29: 114 mg via INTRAVENOUS
  Filled 2017-04-29: qty 19

## 2017-04-29 MED ORDER — POTASSIUM CHLORIDE CRYS ER 20 MEQ PO TBCR: 20.0000 meq | EXTENDED_RELEASE_TABLET | Freq: Two times a day (BID) | ORAL | 0 refills | Status: DC

## 2017-04-29 MED ORDER — SODIUM CHLORIDE 0.9 % IV SOLN
20.0000 mg | Freq: Once | INTRAVENOUS | Status: AC
  Administered 2017-04-29: 20 mg via INTRAVENOUS
  Filled 2017-04-29: qty 2

## 2017-04-29 MED ORDER — FAMOTIDINE IN NACL 20-0.9 MG/50ML-% IV SOLN
20.0000 mg | Freq: Once | INTRAVENOUS | Status: AC
  Administered 2017-04-29: 20 mg via INTRAVENOUS

## 2017-04-29 MED ORDER — SODIUM CHLORIDE 0.9% FLUSH
10.0000 mL | INTRAVENOUS | Status: DC | PRN
  Filled 2017-04-29: qty 10

## 2017-04-29 MED ORDER — HEPARIN SOD (PORK) LOCK FLUSH 100 UNIT/ML IV SOLN
500.0000 [IU] | Freq: Once | INTRAVENOUS | Status: DC | PRN
  Filled 2017-04-29: qty 5

## 2017-04-29 MED ORDER — SODIUM CHLORIDE 0.9 % IV SOLN
Freq: Once | INTRAVENOUS | Status: AC
  Administered 2017-04-29: 12:00:00 via INTRAVENOUS

## 2017-04-29 NOTE — Progress Notes (Signed)
Per Mendel Ryder, NP okay to treat with a K+ 3.0 and ANC 1.2.

## 2017-04-29 NOTE — Assessment & Plan Note (Addendum)
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 T3N0 stage IIIb  01/19/2017:Right breast 10 x 8.3 x 6.9 cm area of cancer involving all 4 quadrants with skin involvement extends from nipple to pectoralis muscle and involving right axillary lymph nodes, right internal mammary nodes  01/22/2017: CT CAP: 1.9 cm dome of the right hepatic lobe suspicious for liver metastases, 9 mm groundglass nodule in the right lung apex could be inflammation versus cancer Liver MRI: Lesion is benign involuted cyst  Treatment plan: 1.Neoadj chemo with dose dense AC foll by Taxoland carboplatin 2. Mastectomy 3. XRT 4.Consideration for participation inSWOG [id number] clinical trial -------------------------------------------------------------------------------------------------------------------------------------------------- Current treatment:Completed 4 cycles ofdose dense Adriamycin Cytoxan, today is cycle 4 of weekly Taxolwith carboplatin (q 3 weeks) Echocardiogram 01/18/17: EF65-70%  Denaya is here for chemotherapy.  Her ANC is 1.2.  She is ok to Proceed with Taxol today.  I offered to give her Granix next Tuesday, to prevent neutropenia next week, however she declined.  Delorus's heart rate is elevated.  We discussed this, and she thinks it is related to anxiety from our cancer center.  I taught her how to check her pulse.  She will check her pulse periodically throughout the next week and bring those readings into us next week.  We may need to start a low dose beta blocker, or refer to cardiology.  She knows to let us know if she develops symptoms such as chest pain, palpitations, or dyspnea, as that would require intervention.  Maydelin was counseled  that the fatigue is related to the chemotherapy.  I encouraged energy conservation and short walks once or twice per day.    Undine will return in one week for labs and Taxol, and in two weeks for labs, f/u with Dr. Gudena, and Taxol/Carbo.       

## 2017-04-29 NOTE — Progress Notes (Signed)
[facility] Follow up:    Kathryn Hippo, MD [name] [place] [address]   DIAGNOSIS: Cancer Staging Malignant neoplasm of upper-outer quadrant of right breast in female, estrogen receptor negative ([place]) Staging form: Breast, AJCC 8th Edition - Clinical stage from 01/13/2017: Stage IIIB (cT3, cN0, cM0, G3, ER: Negative, PR: Negative, HER2: Negative) - Unsigned Staging comments: Staged at breast conference on 1.2.19   SUMMARY OF ONCOLOGIC HISTORY:   Malignant neoplasm of upper-outer quadrant of right breast in female, estrogen receptor negative ([place])   12/29/2016 Initial Diagnosis    Palpable right breast lump for 6 months, mammogram revealed 6 cm mass at 1 o'clock position 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  T3N0 stage IIIb      01/19/2017 Breast MRI    Right breast 10 x 8.3 x 6.9 cm area of cancer involving all 4 quadrants with skin involvement extends from nipple to pectoralis muscle and involving right axillary lymph nodes, right internal mammary nodes      01/22/2017 Imaging    CT chest abdomen pelvis reveals [name] 6 cm right breast mass, mild right axillary lymphadenopathy, 1.9 cm peripheral enhancing lesion in the dome of the right hepatic lobe, 9 mm groundglass nodule right lung apex      01/28/2017 -  Neo-Adjuvant Chemotherapy    Dose dense Adriamycin and Cytoxan  followed with Taxol and carboplatin       CURRENT THERAPY: Taxol/Carbo  INTERVAL HISTORY: Kathryn [name] 57 y.o. female returns for evaluation of her breast cancer prior to receiving her neo adjuvant chemotherapy.  She continues to experience fatigue.  She is very tired.  She says that this week it has been worse than usual.  It was terrible this past Tuesday.  She is also constipated.  She denies peripheral  neuropathy.  She says her tumor feels softer and as if it has shrunk some.  Her heart rate is elevated today, and she says it is typically elevated when she is here, but doesn't note it when she is not here.  She has no associated symptoms with her heart rate elevation.  She says she drinks plenty of water.   Patient Active Problem List   Diagnosis Date Noted  . Port-[name]-Cath in place 02/11/2017  . Malignant neoplasm of upper-outer quadrant of right breast in female, estrogen receptor negative ([place]) 01/04/2017  . 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  MEDICAL HISTORY: Past Medical History:  Diagnosis Date  . Anxiety   . Depression with anxiety   . Endometrioma   . Endometriosis   . Mass of pelvis   . Torticollis, unspecified NECK MUSCLE--  OCCASIONAL    SURGICAL HISTORY: Past Surgical History:  Procedure Laterality Date  . ABDOMINAL HYSTERECTOMY  06/29/2011   Procedure: HYSTERECTOMY ABDOMINAL;  Surgeon: Selinda Orion, MD;  Location: [facility];  Service: Gynecology;  Laterality: N/[name];  PROCEDURE READS: EXPLORATORY LAPAROTOMY, TOTAL ABDOMINAL HYSTERECTOMY WITH BSO AND  SUPERCERVICAL HYSTERECTOMY WITH BSO AND EXCISION OF ENDOMETRIOMIAS OWER  . BENIGN BREAST CYST REMOVED  YRS AGO  . ENDOMETRIAL ABLATION  06/29/2011   Procedure: ENDOMETRIAL ABLATION;  Surgeon: Selinda Orion, MD;  Location: [facility];  Service: Gynecology;  Laterality: N/[name];  . EXCISION OF [name] HIDRADENITIS ABSCESS, LEFT INGUINAL AREA  03-30-2005  . IR FLUORO GUIDE PORT INSERTION LEFT  01/26/2017  . IR US GUIDE VASC ACCESS LEFT  01/26/2017  . LAPAROTOMY  06/29/2011   Procedure: EXPLORATORY LAPAROTOMY;  Surgeon: Selinda Orion, MD;  Location: [facility];  Service: Gynecology;  Laterality: N/[name];  . SALPINGOOPHORECTOMY  06/29/2011   Procedure: SALPINGO OOPHERECTOMY;  Surgeon: Selinda Orion, MD;  Location: [facility];  Service: Gynecology;   Laterality: Bilateral;    SOCIAL HISTORY: Social History   Socioeconomic History  . Marital status: Divorced    Spouse name: Not on file  . Number of children: 2  . Years of education: Not on file  . Highest education level: Not on file  Occupational History  . Occupation: [employer]    Employer: [employer]  . Financial resource strain: Not on file  . Food insecurity:    Worry: Not on file    Inability: Not on file  . Transportation needs:    Medical: Not on file    Non-medical: Not on file  Tobacco Use  . Smoking status: Never Smoker  . Smokeless tobacco: Never Used  Substance and Sexual Activity  . Alcohol use: Yes    Comment: occ  . Drug use: No  . Sexual activity: Never    Birth control/protection: None, Surgical  Lifestyle  . Physical activity:    Days per week: 0 days    Minutes per session: 0 min  . Stress: Only [name] little  Relationships  . Social connections:    Talks on phone: More than three times [name] week    Gets together: Once [name] week    Attends religious service: 1 to 4 times per year    Active member of club or organization: No    Attends meetings of clubs or organizations: Never    Relationship status: Divorced  . Intimate partner violence:    Fear of current or ex partner: No    Emotionally abused: No    Physically abused: No    Forced sexual activity: No  Other Topics Concern  . Not on file  Social History Narrative  . Not on file    FAMILY HISTORY: Family History  Problem Relation Age of Onset  . Cancer Father   . Diabetes Father   . Hypertension Father   . Cancer Maternal Aunt   . Heart attack Paternal Aunt   . Breast cancer Paternal Aunt   . Breast cancer Cousin   . Anesthesia problems Neg Hx     Review of Systems  Constitutional: Positive for fatigue. Negative for appetite change, chills, fever and unexpected weight change.  HENT:   Negative for hearing loss and lump/mass.   Eyes: Negative for eye  problems and icterus.  Respiratory: Negative for chest tightness, cough and shortness of breath.   Cardiovascular: Negative for chest pain, leg swelling and palpitations.  Gastrointestinal: Negative for abdominal distention, abdominal pain, constipation, diarrhea, nausea and vomiting.  Endocrine: Negative for hot flashes.  Musculoskeletal: Negative for arthralgias.  Skin: Negative for itching and rash.  Neurological: Negative for dizziness, extremity weakness, headaches and numbness.  Hematological: Negative for adenopathy. Does not bruise/bleed easily.  Psychiatric/Behavioral: Negative for depression. The patient is not nervous/anxious.       PHYSICAL EXAMINATION  ECOG PERFORMANCE STATUS: 1 - Symptomatic but completely ambulatory  Vitals:   04/29/17 1020  BP: 129/77  Pulse: (!) 128  Resp: 18  Temp: 98.7 F (  37.1 C)  SpO2: 100%    Physical Exam  Constitutional: She is oriented to person, place, and time and well-developed, well-nourished, and in no distress.  HENT:  Head: Normocephalic and atraumatic.  Mouth/Throat: Oropharynx is clear and moist. No oropharyngeal exudate.  Eyes: Pupils are equal, round, and reactive to light. No scleral icterus.  Neck: Neck supple.  Cardiovascular: Normal rate, regular rhythm and normal heart sounds.  Pulmonary/Chest: Effort normal and breath sounds normal. No respiratory distress. She has no wheezes. She has no rales.  Abdominal: Soft. Bowel sounds are normal. She exhibits no distension and no mass. There is no tenderness. There is no rebound and no guarding.  Lymphadenopathy:    She has no cervical adenopathy.  Neurological: She is alert and oriented to person, place, and time.  Skin: Skin is warm and dry. No rash noted.  Psychiatric: Mood and affect normal.    LABORATORY DATA:  CBC    Component Value Date/Time   WBC 1.9 (L) 04/29/2017 0958   RBC 3.19 (L) 04/29/2017 0958   HGB 10.6 (L) 04/29/2017 0958   HGB 14.9 01/13/2017 0823    HCT 30.8 (L) 04/29/2017 0958   HCT 43.9 01/13/2017 0823   PLT 227 04/29/2017 0958   PLT 158 02/25/2017 0832   PLT 268 01/13/2017 0823   MCV 96.5 04/29/2017 0958   MCV 89.2 01/13/2017 0823   MCH 33.2 04/29/2017 0958   MCHC 34.4 04/29/2017 0958   RDW 20.8 (H) 04/29/2017 0958   RDW 13.6 01/13/2017 0823   LYMPHSABS 0.4 (L) 04/29/2017 0958   LYMPHSABS 1.6 01/13/2017 0823   MONOABS 0.2 04/29/2017 0958   MONOABS 0.4 01/13/2017 0823   EOSABS 0.0 04/29/2017 0958   EOSABS 0.3 01/13/2017 0823   BASOSABS 0.0 04/29/2017 0958   BASOSABS 0.0 01/13/2017 0823    CMP     Component Value Date/Time   NA 137 04/29/2017 0958   NA 140 01/13/2017 0823   K 3.0 (LL) 04/29/2017 0958   K 4.1 01/13/2017 0823   CL 100 04/29/2017 0958   CO2 25 04/29/2017 0958   CO2 27 01/13/2017 0823   GLUCOSE 190 (H) 04/29/2017 0958   GLUCOSE 110 01/13/2017 0823   BUN 13 04/29/2017 0958   BUN 13.4 01/13/2017 0823   CREATININE 0.83 04/29/2017 0958   CREATININE 0.85 02/25/2017 0832   CREATININE 0.9 01/13/2017 0823   CALCIUM 9.6 04/29/2017 0958   CALCIUM 9.2 01/13/2017 0823   PROT 6.7 04/29/2017 0958   PROT 6.9 01/13/2017 0823   ALBUMIN 3.7 04/29/2017 0958   ALBUMIN 3.7 01/13/2017 0823   AST 22 04/29/2017 0958   AST 16 02/25/2017 0832   AST 24 01/13/2017 0823   ALT 28 04/29/2017 0958   ALT 22 02/25/2017 0832   ALT 34 01/13/2017 0823   ALKPHOS 106 04/29/2017 0958   ALKPHOS 87 01/13/2017 0823   BILITOT 0.3 04/29/2017 0958   BILITOT 0.3 02/25/2017 0832   BILITOT 0.39 01/13/2017 0823   GFRNONAA >60 04/29/2017 0958   GFRNONAA >60 02/25/2017 0832   GFRAA >60 04/29/2017 0958   GFRAA >60 02/25/2017 0832       ASSESSMENT and THERAPY PLAN:   Malignant neoplasm of upper-outer quadrant of right breast in female, estrogen receptor negative ([place]) 12/29/2016: Palpable right breast lump for 6 months, mammogram revealed 6 cm mass at 1 o'clock position biopsy revealed grade 2 invasive ductal carcinoma ER 0%, PR  0%, HER-2 negative, Ki-67 40%, axilla negative; second biopsy fibroadenoma 1.6 cm;  additional 0.6 cm nodule at 11 o'clock position not biopsied. T3N0 stage IIIb  01/19/2017:Right breast 10 x 8.3 x 6.9 cm area of cancer involving all 4 quadrants with skin involvement extends from nipple to pectoralis muscle and involving right axillary lymph nodes, right internal mammary nodes  01/22/2017: CT CAP: 1.9 cm dome of the right hepatic lobe suspicious for liver metastases, 9 mm groundglass nodule in the right lung apex could be inflammation versus cancer Liver MRI: Lesion is benign involuted cyst  Treatment plan: 1.Neoadj chemo with dose dense AC foll by Taxoland carboplatin 2. Mastectomy 3. XRT 4.Consideration for participation inSWOG [phone] clinical trial -------------------------------------------------------------------------------------------------------------------------------------------------- Current treatment:Completed 4 cycles ofdose dense Adriamycin Cytoxan, today is cycle 4 of weekly Taxolwith carboplatin (q 3 weeks) Echocardiogram 01/18/17: EF65-70%  Kathryn [name] is here for chemotherapy.  Her ANC is 1.2.  She is ok to Proceed with Taxol today.  I offered to give her Granix next Tuesday, to prevent neutropenia next week, however she declined.  Kathryn [name]'s heart rate is elevated.  We discussed this, and she thinks it is related to anxiety from our cancer center.  I taught her how to check her pulse.  She will check her pulse periodically throughout the next week and bring those readings into [place] next week.  We may need to start [name] low dose beta blocker, or refer to cardiology.  She knows to let us know if she develops symptoms such as chest pain, palpitations, or dyspnea, as that would require intervention.  Kathryn [name] was counseled that the fatigue is related to the chemotherapy.  I encouraged energy conservation and short walks once or twice per day.    Kathryn [name] will return in one week for  labs and Taxol, and in two weeks for labs, f/u with Dr. Lindi Adie, and Taxol/Carbo.        All questions were answered. The patient knows to call the clinic with any problems, questions or concerns. We can certainly see the patient much sooner if necessary.  [name] total of (30) minutes of face-to-face time was spent with this patient with greater than 50% of that time in counseling and care-coordination.  This note was electronically signed. Scot Dock, NP 04/29/2017

## 2017-04-29 NOTE — Patient Instructions (Signed)
[facility] Discharge Instructions for Patients Receiving Chemotherapy  Today you received the following chemotherapy agents:  Taxol.  To help prevent nausea and vomiting after your treatment, we encourage you to take your nausea medication as directed.   If you develop nausea and vomiting that is not controlled by your nausea medication, call the clinic.   BELOW ARE SYMPTOMS THAT SHOULD BE REPORTED IMMEDIATELY:  *FEVER GREATER THAN 100.5 F  *CHILLS WITH OR WITHOUT FEVER  NAUSEA AND VOMITING THAT IS NOT CONTROLLED WITH YOUR NAUSEA MEDICATION  *UNUSUAL SHORTNESS OF BREATH  *UNUSUAL BRUISING OR BLEEDING  TENDERNESS IN MOUTH AND THROAT WITH OR WITHOUT PRESENCE OF ULCERS  *URINARY PROBLEMS  *BOWEL PROBLEMS  UNUSUAL RASH Items with * indicate a potential emergency and should be followed up as soon as possible.  Feel free to call the clinic should you have any questions or concerns. The clinic phone number is [phone].  Please show the CHEMO ALERT CARD at check-in to the Emergency Department and triage nurse.   

## 2017-04-29 NOTE — Progress Notes (Signed)
[place] CSW Progress Notes  Proof of filing for Social Security Administration disability for this patient received from [facility].  Edwyna Shell, LCSW Clinical Social Worker Phone:  [phone]

## 2017-04-30 ENCOUNTER — Telehealth: Payer: Self-pay | Admitting: Adult Health

## 2017-04-30 NOTE — Telephone Encounter (Signed)
Per 4/18 no los °

## 2017-05-03 ENCOUNTER — Telehealth: Payer: Self-pay

## 2017-05-03 NOTE — Telephone Encounter (Signed)
Received VM from pt in regards to if she needs to come in before her scheduled appt on 4-25, how is platelet count now? Do I need to come in for a shot?  Notified Wilber Bihari NP.  I called pt back and gave her platelet recent platelet results of 227, within normal range.  Per Mendel Ryder NP, there is not an injection for platelets and previously discussed Neupogen and pt preferred not to get it at this time and just watch labs.  Pt agreed and voiced understanding.  She verbalized being fine with waiting to have labs rechecked on 4-25 as planned, prior to infusion.  Reminded her she can call back for any other questions / concerns.  She reports she has been taking her new prescription of Potassium and has "more energy".  No other needs at this time per pt.

## 2017-05-06 ENCOUNTER — Inpatient Hospital Stay: Payer: Medicaid Other

## 2017-05-06 VITALS — BP 124/84 | HR 117 | Temp 98.5°F | Resp 17 | Ht 62.0 in | Wt 154.0 lb

## 2017-05-06 DIAGNOSIS — Z171 Estrogen receptor negative status [ER-]: Principal | ICD-10-CM

## 2017-05-06 DIAGNOSIS — C50411 Malignant neoplasm of upper-outer quadrant of right female breast: Secondary | ICD-10-CM

## 2017-05-06 DIAGNOSIS — Z95828 Presence of other vascular implants and grafts: Secondary | ICD-10-CM

## 2017-05-06 DIAGNOSIS — Z5111 Encounter for antineoplastic chemotherapy: Secondary | ICD-10-CM | POA: Diagnosis not present

## 2017-05-06 LAB — CBC WITH DIFFERENTIAL/PLATELET
BASOS ABS: 0 10*3/uL (ref 0.0–0.1)
Basophils Relative: 1 %
EOS PCT: 1 %
Eosinophils Absolute: 0 10*3/uL (ref 0.0–0.5)
HEMATOCRIT: 26.4 % — AB (ref 34.8–46.6)
Hemoglobin: 8.9 g/dL — ABNORMAL LOW (ref 11.6–15.9)
LYMPHS PCT: 34 %
Lymphs Abs: 0.7 10*3/uL — ABNORMAL LOW (ref 0.9–3.3)
MCH: 33.6 pg (ref 25.1–34.0)
MCHC: 33.7 g/dL (ref 31.5–36.0)
MCV: 99.6 fL (ref 79.5–101.0)
Monocytes Absolute: 0.3 10*3/uL (ref 0.1–0.9)
Monocytes Relative: 12 %
Neutro Abs: 1.1 10*3/uL — ABNORMAL LOW (ref 1.5–6.5)
Neutrophils Relative %: 52 %
PLATELETS: 133 10*3/uL — AB (ref 145–400)
RBC: 2.65 MIL/uL — AB (ref 3.70–5.45)
RDW: 19 % — AB (ref 11.2–14.5)
WBC: 2 10*3/uL — AB (ref 3.9–10.3)
nRBC: 2 /100 WBC — ABNORMAL HIGH

## 2017-05-06 LAB — COMPREHENSIVE METABOLIC PANEL
ALT: 28 U/L (ref 0–55)
AST: 19 U/L (ref 5–34)
Albumin: 3.4 g/dL — ABNORMAL LOW (ref 3.5–5.0)
Alkaline Phosphatase: 95 U/L (ref 40–150)
Anion gap: 11 (ref 3–11)
BILIRUBIN TOTAL: 0.3 mg/dL (ref 0.2–1.2)
BUN: 10 mg/dL (ref 7–26)
CO2: 23 mmol/L (ref 22–29)
Calcium: 8.7 mg/dL (ref 8.4–10.4)
Chloride: 107 mmol/L (ref 98–109)
Creatinine, Ser: 0.75 mg/dL (ref 0.60–1.10)
Glucose, Bld: 183 mg/dL — ABNORMAL HIGH (ref 70–140)
POTASSIUM: 3.1 mmol/L — AB (ref 3.5–5.1)
Sodium: 141 mmol/L (ref 136–145)
TOTAL PROTEIN: 6 g/dL — AB (ref 6.4–8.3)

## 2017-05-06 MED ORDER — DIPHENHYDRAMINE HCL 50 MG/ML IJ SOLN
50.0000 mg | Freq: Once | INTRAMUSCULAR | Status: AC
  Administered 2017-05-06: 50 mg via INTRAVENOUS

## 2017-05-06 MED ORDER — FAMOTIDINE IN NACL 20-0.9 MG/50ML-% IV SOLN
20.0000 mg | Freq: Once | INTRAVENOUS | Status: AC
  Administered 2017-05-06: 20 mg via INTRAVENOUS

## 2017-05-06 MED ORDER — SODIUM CHLORIDE 0.9 % IV SOLN
65.0000 mg/m2 | Freq: Once | INTRAVENOUS | Status: AC
  Administered 2017-05-06: 114 mg via INTRAVENOUS
  Filled 2017-05-06: qty 19

## 2017-05-06 MED ORDER — HEPARIN SOD (PORK) LOCK FLUSH 100 UNIT/ML IV SOLN
500.0000 [IU] | Freq: Once | INTRAVENOUS | Status: AC | PRN
  Administered 2017-05-06: 500 [IU]
  Filled 2017-05-06: qty 5

## 2017-05-06 MED ORDER — SODIUM CHLORIDE 0.9 % IV SOLN
20.0000 mg | Freq: Once | INTRAVENOUS | Status: AC
  Administered 2017-05-06: 20 mg via INTRAVENOUS
  Filled 2017-05-06: qty 2

## 2017-05-06 MED ORDER — SODIUM CHLORIDE 0.9 % IV SOLN
Freq: Once | INTRAVENOUS | Status: AC
  Administered 2017-05-06: 10:00:00 via INTRAVENOUS

## 2017-05-06 MED ORDER — SODIUM CHLORIDE 0.9% FLUSH
10.0000 mL | INTRAVENOUS | Status: DC | PRN
Start: 2017-05-06 — End: 2017-05-06
  Administered 2017-05-06: 10 mL
  Filled 2017-05-06: qty 10

## 2017-05-06 MED ORDER — FAMOTIDINE IN NACL 20-0.9 MG/50ML-% IV SOLN
INTRAVENOUS | Status: AC
  Filled 2017-05-06: qty 50

## 2017-05-06 MED ORDER — DIPHENHYDRAMINE HCL 50 MG/ML IJ SOLN
INTRAMUSCULAR | Status: AC
  Filled 2017-05-06: qty 1

## 2017-05-06 MED ORDER — SODIUM CHLORIDE 0.9% FLUSH
10.0000 mL | INTRAVENOUS | Status: DC | PRN
  Administered 2017-05-06: 10 mL via INTRAVENOUS
  Filled 2017-05-06: qty 10

## 2017-05-06 NOTE — Progress Notes (Signed)
Per Mendel Ryder Causey-NP: OK to treat with ANC 1.1

## 2017-05-06 NOTE — Patient Instructions (Signed)
[facility] Discharge Instructions for Patients Receiving Chemotherapy  Today you received the following chemotherapy agents:  Taxol.  To help prevent nausea and vomiting after your treatment, we encourage you to take your nausea medication as directed.   If you develop nausea and vomiting that is not controlled by your nausea medication, call the clinic.   BELOW ARE SYMPTOMS THAT SHOULD BE REPORTED IMMEDIATELY:  *FEVER GREATER THAN 100.5 F  *CHILLS WITH OR WITHOUT FEVER  NAUSEA AND VOMITING THAT IS NOT CONTROLLED WITH YOUR NAUSEA MEDICATION  *UNUSUAL SHORTNESS OF BREATH  *UNUSUAL BRUISING OR BLEEDING  TENDERNESS IN MOUTH AND THROAT WITH OR WITHOUT PRESENCE OF ULCERS  *URINARY PROBLEMS  *BOWEL PROBLEMS  UNUSUAL RASH Items with * indicate a potential emergency and should be followed up as soon as possible.  Feel free to call the clinic should you have any questions or concerns. The clinic phone number is [phone].  Please show the CHEMO ALERT CARD at check-in to the Emergency Department and triage nurse.   

## 2017-05-10 ENCOUNTER — Telehealth: Payer: Self-pay

## 2017-05-10 NOTE — Telephone Encounter (Signed)
LVM for patient regarding her potassium level. Re-educated patient to make sure she is taking her potassium supplements 2x a day. As well as increasing potassium in her diet. Informed her of a list of dietary options. We will recheck labs when she returns.  Cyndia Bent RN

## 2017-05-13 ENCOUNTER — Inpatient Hospital Stay (HOSPITAL_BASED_OUTPATIENT_CLINIC_OR_DEPARTMENT_OTHER): Payer: Medicaid Other | Admitting: Hematology and Oncology

## 2017-05-13 ENCOUNTER — Inpatient Hospital Stay: Payer: Medicaid Other | Attending: Radiation Oncology

## 2017-05-13 ENCOUNTER — Inpatient Hospital Stay: Payer: Medicaid Other

## 2017-05-13 ENCOUNTER — Telehealth: Payer: Self-pay | Admitting: *Deleted

## 2017-05-13 VITALS — HR 121

## 2017-05-13 DIAGNOSIS — Z79899 Other long term (current) drug therapy: Secondary | ICD-10-CM | POA: Diagnosis not present

## 2017-05-13 DIAGNOSIS — Z171 Estrogen receptor negative status [ER-]: Secondary | ICD-10-CM | POA: Insufficient documentation

## 2017-05-13 DIAGNOSIS — Z5111 Encounter for antineoplastic chemotherapy: Secondary | ICD-10-CM | POA: Diagnosis not present

## 2017-05-13 DIAGNOSIS — C50411 Malignant neoplasm of upper-outer quadrant of right female breast: Secondary | ICD-10-CM

## 2017-05-13 DIAGNOSIS — R000 Tachycardia, unspecified: Secondary | ICD-10-CM | POA: Insufficient documentation

## 2017-05-13 DIAGNOSIS — D72819 Decreased white blood cell count, unspecified: Secondary | ICD-10-CM | POA: Diagnosis not present

## 2017-05-13 DIAGNOSIS — E876 Hypokalemia: Secondary | ICD-10-CM | POA: Insufficient documentation

## 2017-05-13 DIAGNOSIS — R5383 Other fatigue: Secondary | ICD-10-CM | POA: Diagnosis not present

## 2017-05-13 LAB — CBC WITH DIFFERENTIAL/PLATELET
BASOS ABS: 0 10*3/uL (ref 0.0–0.1)
Basophils Relative: 1 %
Eosinophils Absolute: 0 10*3/uL (ref 0.0–0.5)
Eosinophils Relative: 2 %
HEMATOCRIT: 27.5 % — AB (ref 34.8–46.6)
HEMOGLOBIN: 9.2 g/dL — AB (ref 11.6–15.9)
LYMPHS ABS: 0.7 10*3/uL — AB (ref 0.9–3.3)
LYMPHS PCT: 26 %
MCH: 34.1 pg — ABNORMAL HIGH (ref 25.1–34.0)
MCHC: 33.5 g/dL (ref 31.5–36.0)
MCV: 101.9 fL — AB (ref 79.5–101.0)
MONO ABS: 0.2 10*3/uL (ref 0.1–0.9)
Monocytes Relative: 6 %
NEUTROS ABS: 1.7 10*3/uL (ref 1.5–6.5)
Neutrophils Relative %: 65 %
Platelets: 134 10*3/uL — ABNORMAL LOW (ref 145–400)
RBC: 2.7 MIL/uL — AB (ref 3.70–5.45)
RDW: 18.5 % — ABNORMAL HIGH (ref 11.2–14.5)
WBC: 2.6 10*3/uL — AB (ref 3.9–10.3)
nRBC: 2 /100 WBC — ABNORMAL HIGH

## 2017-05-13 LAB — COMPREHENSIVE METABOLIC PANEL
ALBUMIN: 3.6 g/dL (ref 3.5–5.0)
ALT: 33 U/L (ref 0–55)
ANION GAP: 10 (ref 3–11)
AST: 21 U/L (ref 5–34)
Alkaline Phosphatase: 99 U/L (ref 40–150)
BILIRUBIN TOTAL: 0.3 mg/dL (ref 0.2–1.2)
BUN: 10 mg/dL (ref 7–26)
CHLORIDE: 105 mmol/L (ref 98–109)
CO2: 23 mmol/L (ref 22–29)
Calcium: 8.9 mg/dL (ref 8.4–10.4)
Creatinine, Ser: 0.81 mg/dL (ref 0.60–1.10)
GFR calc Af Amer: >60 mL/min (ref >60)
GFR calc non Af Amer: >60 mL/min (ref >60)
GLUCOSE: 234 mg/dL — AB (ref 70–140)
POTASSIUM: 3.3 mmol/L — AB (ref 3.5–5.1)
Sodium: 138 mmol/L (ref 136–145)
Total Protein: 6.3 g/dL — ABNORMAL LOW (ref 6.4–8.3)

## 2017-05-13 MED ORDER — POTASSIUM CHLORIDE CRYS ER 20 MEQ PO TBCR: 20.0000 meq | EXTENDED_RELEASE_TABLET | Freq: Two times a day (BID) | ORAL | 2 refills | Status: DC

## 2017-05-13 MED ORDER — SODIUM CHLORIDE 0.9 % IV SOLN
65.0000 mg/m2 | Freq: Once | INTRAVENOUS | Status: AC
  Administered 2017-05-13: 114 mg via INTRAVENOUS
  Filled 2017-05-13: qty 19

## 2017-05-13 MED ORDER — DIPHENHYDRAMINE HCL 50 MG/ML IJ SOLN
50.0000 mg | Freq: Once | INTRAMUSCULAR | Status: AC
  Administered 2017-05-13: 50 mg via INTRAVENOUS

## 2017-05-13 MED ORDER — PALONOSETRON HCL INJECTION 0.25 MG/5ML
0.2500 mg | Freq: Once | INTRAVENOUS | Status: AC
  Administered 2017-05-13: 0.25 mg via INTRAVENOUS

## 2017-05-13 MED ORDER — FAMOTIDINE IN NACL 20-0.9 MG/50ML-% IV SOLN
20.0000 mg | Freq: Once | INTRAVENOUS | Status: AC
  Administered 2017-05-13: 20 mg via INTRAVENOUS

## 2017-05-13 MED ORDER — HEPARIN SOD (PORK) LOCK FLUSH 100 UNIT/ML IV SOLN
500.0000 [IU] | Freq: Once | INTRAVENOUS | Status: AC | PRN
  Administered 2017-05-13: 500 [IU]
  Filled 2017-05-13: qty 5

## 2017-05-13 MED ORDER — DIPHENHYDRAMINE HCL 50 MG/ML IJ SOLN
INTRAMUSCULAR | Status: AC
  Filled 2017-05-13: qty 1

## 2017-05-13 MED ORDER — SODIUM CHLORIDE 0.9% FLUSH
10.0000 mL | INTRAVENOUS | Status: DC | PRN
  Administered 2017-05-13: 10 mL
  Filled 2017-05-13: qty 10

## 2017-05-13 MED ORDER — SODIUM CHLORIDE 0.9 % IV SOLN
Freq: Once | INTRAVENOUS | Status: AC
  Administered 2017-05-13: 11:00:00 via INTRAVENOUS
  Filled 2017-05-13: qty 5

## 2017-05-13 MED ORDER — PALONOSETRON HCL INJECTION 0.25 MG/5ML
INTRAVENOUS | Status: AC
  Filled 2017-05-13: qty 5

## 2017-05-13 MED ORDER — SODIUM CHLORIDE 0.9 % IV SOLN
Freq: Once | INTRAVENOUS | Status: AC
  Administered 2017-05-13: 10:00:00 via INTRAVENOUS

## 2017-05-13 MED ORDER — FAMOTIDINE IN NACL 20-0.9 MG/50ML-% IV SOLN
INTRAVENOUS | Status: AC
  Filled 2017-05-13: qty 50

## 2017-05-13 MED ORDER — SODIUM CHLORIDE 0.9 % IV SOLN
560.0000 mg | Freq: Once | INTRAVENOUS | Status: AC
  Administered 2017-05-13: 560 mg via INTRAVENOUS
  Filled 2017-05-13: qty 56

## 2017-05-13 NOTE — Patient Instructions (Signed)
[facility] °Discharge Instructions for Patients Receiving Chemotherapy ° °Today you received the following chemotherapy agents Taxol; Carboplatin ° °To help prevent nausea and vomiting after your treatment, we encourage you to take your nausea medication as directed °  °If you develop nausea and vomiting that is not controlled by your nausea medication, call the clinic.  ° °BELOW ARE SYMPTOMS THAT SHOULD BE REPORTED IMMEDIATELY: °· *FEVER GREATER THAN 100.5 F °· *CHILLS WITH OR WITHOUT FEVER °· NAUSEA AND VOMITING THAT IS NOT CONTROLLED WITH YOUR NAUSEA MEDICATION °· *UNUSUAL SHORTNESS OF BREATH °· *UNUSUAL BRUISING OR BLEEDING °· TENDERNESS IN MOUTH AND THROAT WITH OR WITHOUT PRESENCE OF ULCERS °· *URINARY PROBLEMS °· *BOWEL PROBLEMS °· UNUSUAL RASH °Items with * indicate a potential emergency and should be followed up as soon as possible. ° °Feel free to call the clinic should you have any questions or concerns. The clinic phone number is [phone]. ° °Please show the CHEMO ALERT CARD at check-in to the Emergency Department and triage nurse. ° ° °

## 2017-05-13 NOTE — Assessment & Plan Note (Signed)
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 T3N0 stage IIIb  01/19/2017:Right breast 10 x 8.3 x 6.9 cm area of cancer involving all 4 quadrants with skin involvement extends from nipple to pectoralis muscle and involving right axillary lymph nodes, right internal mammary nodes  01/22/2017: CT CAP: 1.9 cm dome of the right hepatic lobe suspicious for liver metastases, 9 mm groundglass nodule in the right lung apex could be inflammation versus cancer Liver MRI: Lesion is benign involuted cyst  Treatment plan: 1.Neoadj chemo with dose dense AC foll by Taxoland carboplatin 2. Mastectomy 3. XRT 4.Consideration for participation inSWOG [id number] clinical trial -------------------------------------------------------------------------------------------------------------------------------------------------- Current treatment:Completed 4 cycles ofdose dense Adriamycin Cytoxan, todayiscycle7of weeklyTaxolwith carboplatin(q 3 weeks) Echocardiogram 01/18/17: EF65-70%  Chemo toxicities: 1.  Fatigue 2. Leukopenia/neutropenia  Return to clinic weekly for chemo and every 2 weeks for follow-up with me  

## 2017-05-13 NOTE — Progress Notes (Signed)
Patient Care Team: [name], MD as PCP - General (Family Medicine)  DIAGNOSIS:  Encounter Diagnoses  Name Primary?  . Malignant neoplasm of upper-outer quadrant of right breast in female, estrogen receptor negative ([place])   . 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  T3N0 stage IIIb      01/19/2017 Breast MRI    Right breast 10 x 8.3 x 6.9 cm area of cancer involving all 4 quadrants with skin involvement extends from nipple to pectoralis muscle and involving right axillary lymph nodes, right internal mammary nodes      01/22/2017 Imaging    CT chest abdomen pelvis reveals a 6 cm right breast mass, mild right axillary lymphadenopathy, 1.9 cm peripheral enhancing lesion in the dome of the right hepatic lobe, 9 mm groundglass nodule right lung apex      01/28/2017 -  Neo-Adjuvant Chemotherapy    Dose dense Adriamycin and Cytoxan  followed with Taxol and carboplatin       CHIEF COMPLIANT: Cycle 7 of weekly Taxol along with carboplatin being given once every 3 weeks  INTERVAL HISTORY: Kathryn [name] is a 57 year old with above-mentioned history of right breast cancer who is undergoing neoadjuvant chemotherapy today is cycle 7 of weekly Taxol.  She also gets carboplatin every 3 weeks.  She appears to be doing quite well with the chemotherapy.  Her blood counts have been slightly low because of chemo and we are monitoring her counts closely.  Denies any fevers or chills denies any nausea or vomiting.  REVIEW OF SYSTEMS:   Constitutional: Denies  fevers, chills or abnormal weight loss Eyes: Denies blurriness of vision Ears, nose, mouth, throat, and face: Denies mucositis or sore throat Respiratory: Denies cough, dyspnea or wheezes Cardiovascular: Denies palpitation, chest discomfort Gastrointestinal:  Denies nausea, heartburn or change in bowel habits Skin: Denies abnormal skin rashes Lymphatics: Denies new lymphadenopathy or easy bruising Neurological:Denies numbness, tingling or new weaknesses Behavioral/Psych: Mood is stable, no new changes  Extremities: No lower extremity edema Breast:  denies any pain or lumps or nodules in either breasts All other systems were reviewed with the patient and are negative.  I have reviewed the past medical history, past surgical history, social history and family history with the patient and they are unchanged from previous note.  ALLERGIES:  has No Known Allergies.  MEDICATIONS:  Current Outpatient Medications  Medication Sig Dispense Refill  . cholecalciferol (VITAMIN D) 1000 UNITS tablet Take 1,000 Units by mouth daily.    . clonazePAM (KLONOPIN) 0.5 MG tablet Take 1 tablet (0.5 mg total) by mouth 3 (three) times daily as needed for anxiety. 90 tablet 5  . cyclobenzaprine (FLEXERIL) 10 MG tablet Take 10 mg by mouth 3 (three) times daily as needed for muscle spasms.    Marland Kitchen lidocaine-prilocaine (EMLA) cream Apply to affected area once (Patient taking differently: Apply 1 application topically 3 times/day as needed-between meals & bedtime. Apply to affected area once) 30 g 3  . LORazepam (ATIVAN) 0.5 MG tablet Take 1 tablet (0.5 mg total) by mouth at  bedtime as needed (Nausea or vomiting). 30 tablet 0  . magic mouthwash w/lidocaine SOLN Take 5 mLs by mouth 2 (two) times daily as needed for mouth pain. 30 mL 0  . Misc Natural Products (ACAI+SUPERFRUIT/GREEN TEA) TABS Take 1 tablet by mouth daily.    . Multiple Vitamin (MULITIVITAMIN WITH MINERALS) TABS Take 1 tablet by mouth daily. Plus vit C  '500mg'$     . naproxen (NAPROSYN) 500 MG tablet Take 500 mg by mouth every 12 (twelve) hours as needed.    . ondansetron (ZOFRAN) 8 MG tablet Take 1 tablet (8 mg total) by mouth 2 (two) times daily as needed. Start on the third day after chemotherapy. 30 tablet 1  . potassium chloride SA (K-DUR,KLOR-CON) 20 MEQ tablet Take 1 tablet (20 mEq total) by mouth 2 (two) times daily. 30 tablet 2  . prochlorperazine (COMPAZINE) 10 MG tablet Take 1 tablet (10 mg total) by mouth every 6 (six) hours as needed (Nausea or vomiting). 30 tablet 1   No current facility-administered medications for this visit.     PHYSICAL EXAMINATION: ECOG PERFORMANCE STATUS: 1 - Symptomatic but completely ambulatory  Vitals:   05/13/17 0924  BP: (!) 152/87  Pulse: (!) 124  Resp: 17  Temp: 97.8 F (36.6 C)  SpO2: 100%   Filed Weights   05/13/17 0924  Weight: 155 lb (70.3 kg)    GENERAL:alert, no distress and comfortable SKIN: skin color, texture, turgor are normal, no rashes or significant lesions EYES: normal, Conjunctiva are pink and non-injected, sclera clear OROPHARYNX:no exudate, no erythema and lips, buccal mucosa, and tongue normal  NECK: supple, thyroid normal size, non-tender, without nodularity LYMPH:  no palpable lymphadenopathy in the cervical, axillary or inguinal LUNGS: clear to auscultation and percussion with normal breathing effort HEART: regular rate & rhythm and no murmurs and no lower extremity edema ABDOMEN:abdomen soft, non-tender and normal bowel sounds MUSCULOSKELETAL:no cyanosis of digits and no clubbing  NEURO: alert & oriented x 3 with fluent speech, no focal motor/sensory deficits EXTREMITIES: No lower extremity edema   LABORATORY DATA:  I have reviewed the data as listed CMP Latest Ref Rng & Units 05/13/2017 05/06/2017 04/29/2017  Glucose 70 - 140 mg/dL 234(H) 183(H) 190(H)  BUN 7 - 26 mg/dL '10 10 13  '$ Creatinine 0.60 - 1.10 mg/dL 0.81 0.75 0.83  Sodium 136 - 145 mmol/L 138 141 137    Potassium 3.5 - 5.1 mmol/L 3.3(L) 3.1(L) 3.0(LL)  Chloride 98 - 109 mmol/L 105 107 100  CO2 22 - 29 mmol/L '23 23 25  '$ Calcium 8.4 - 10.4 mg/dL 8.9 8.7 9.6  Total Protein 6.4 - 8.3 g/dL 6.3(L) 6.0(L) 6.7  Total Bilirubin 0.2 - 1.2 mg/dL 0.3 0.3 0.3  Alkaline Phos 40 - 150 U/L 99 95 106  AST 5 - 34 U/L '21 19 22  '$ ALT 0 - 55 U/L 33 28 28    Lab Results  Component Value Date   WBC 2.6 (L) 05/13/2017   HGB 9.2 (L) 05/13/2017   HCT 27.5 (L) 05/13/2017   MCV 101.9 (H) 05/13/2017   PLT 134 (L) 05/13/2017   NEUTROABS 1.7 05/13/2017    ASSESSMENT & PLAN:  Malignant neoplasm of upper-outer quadrant of right breast in female, estrogen receptor negative ([place]) 12/29/2016: Palpable right breast lump for 6 months, mammogram revealed 6 cm mass at 1 o'clock position biopsy revealed 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 T3N0 stage IIIb  01/19/2017:Right breast 10 x 8.3 x 6.9 cm area of cancer involving all 4 quadrants with skin involvement extends from nipple to pectoralis muscle and involving right axillary lymph nodes, right internal mammary nodes  01/22/2017: CT CAP: 1.9 cm dome of the right hepatic lobe suspicious for liver metastases, 9 mm groundglass nodule in the right lung apex could be inflammation versus cancer Liver MRI: Lesion is benign involuted cyst  Treatment plan: 1.Neoadj chemo with dose dense AC foll by Taxoland carboplatin 2. Mastectomy 3. XRT 4.Consideration for participation inSWOG [phone] clinical trial -------------------------------------------------------------------------------------------------------------------------------------------------- Current treatment:Completed 4 cycles ofdose dense Adriamycin Cytoxan, todayiscycle7of weeklyTaxolwith carboplatin(q 3 weeks) Echocardiogram 01/18/17: EF65-70%  Chemo  toxicities: 1.  Fatigue 2. Leukopenia/neutropenia 3.  Hypokalemia: I ordered potassium replacement therapy 4 tachycardia.:  No specific etiology  Return to clinic weekly for chemo and every 2 weeks for follow-up with me  I discussed with her that we will get MRI of the breast and surgery follow-up when she completes her chemo.   No orders of the defined types were placed in this encounter.  The patient has a good understanding of the overall plan. she agrees with it. she will call with any problems that may develop before the next visit here.   Harriette Ohara, MD 05/13/17

## 2017-05-13 NOTE — Patient Instructions (Signed)
Implanted Port Home Guide An implanted port is a type of central line that is placed under the skin. Central lines are used to provide IV access when treatment or nutrition needs to be given through a person's veins. Implanted ports are used for Koone-term IV access. An implanted port may be placed because:  You need IV medicine that would be irritating to the small veins in your hands or arms.  You need Fuhriman-term IV medicines, such as antibiotics.  You need IV nutrition for a Tremblay period.  You need frequent blood draws for lab tests.  You need dialysis.  Implanted ports are usually placed in the chest area, but they can also be placed in the upper arm, the abdomen, or the leg. An implanted port has two main parts:  Reservoir. The reservoir is round and will appear as a small, raised area under your skin. The reservoir is the part where a needle is inserted to give medicines or draw blood.  Catheter. The catheter is a thin, flexible tube that extends from the reservoir. The catheter is placed into a large vein. Medicine that is inserted into the reservoir goes into the catheter and then into the vein.  How will I care for my incision site? Do not get the incision site wet. Bathe or shower as directed by your health care provider. How is my port accessed? Special steps must be taken to access the port:  Before the port is accessed, a numbing cream can be placed on the skin. This helps numb the skin over the port site.  Your health care provider uses a sterile technique to access the port. ? Your health care provider must put on a mask and sterile gloves. ? The skin over your port is cleaned carefully with an antiseptic and allowed to dry. ? The port is gently pinched between sterile gloves, and a needle is inserted into the port.  Only "non-coring" port needles should be used to access the port. Once the port is accessed, a blood return should be checked. This helps ensure that the port  is in the vein and is not clogged.  If your port needs to remain accessed for a constant infusion, a clear (transparent) bandage will be placed over the needle site. The bandage and needle will need to be changed every week, or as directed by your health care provider.  Keep the bandage covering the needle clean and dry. Do not get it wet. Follow your health care provider's instructions on how to take a shower or bath while the port is accessed.  If your port does not need to stay accessed, no bandage is needed over the port.  What is flushing? Flushing helps keep the port from getting clogged. Follow your health care provider's instructions on how and when to flush the port. Ports are usually flushed with saline solution or a medicine called heparin. The need for flushing will depend on how the port is used.  If the port is used for intermittent medicines or blood draws, the port will need to be flushed: ? After medicines have been given. ? After blood has been drawn. ? As part of routine maintenance.  If a constant infusion is running, the port may not need to be flushed.  How Swader will my port stay implanted? The port can stay in for as [place] as your health care provider thinks it is needed. When it is time for the port to come out, surgery will be   done to remove it. The procedure is similar to the one performed when the port was put in. When should I seek immediate medical care? When you have an implanted port, you should seek immediate medical care if:  You notice a bad smell coming from the incision site.  You have swelling, redness, or drainage at the incision site.  You have more swelling or pain at the port site or the surrounding area.  You have a fever that is not controlled with medicine.  This information is not intended to replace advice given to you by your health care provider. Make sure you discuss any questions you have with your health care provider. Document  Released: 12/29/2004 Document Revised: 06/06/2015 Document Reviewed: 09/05/2012 Elsevier Interactive Patient Education  2017 [employer] Inc.  

## 2017-05-13 NOTE — Telephone Encounter (Signed)
Per MD - ok to treat with heart rate of 121. Noted heme of 9.2

## 2017-05-20 ENCOUNTER — Other Ambulatory Visit: Payer: Self-pay | Admitting: Hematology and Oncology

## 2017-05-20 ENCOUNTER — Inpatient Hospital Stay: Payer: Medicaid Other

## 2017-05-20 ENCOUNTER — Telehealth: Payer: Self-pay | Admitting: Hematology and Oncology

## 2017-05-20 VITALS — BP 143/90 | HR 122 | Temp 98.3°F | Resp 18 | Wt 154.0 lb

## 2017-05-20 DIAGNOSIS — Z5111 Encounter for antineoplastic chemotherapy: Secondary | ICD-10-CM | POA: Diagnosis not present

## 2017-05-20 DIAGNOSIS — C50411 Malignant neoplasm of upper-outer quadrant of right female breast: Secondary | ICD-10-CM

## 2017-05-20 DIAGNOSIS — Z171 Estrogen receptor negative status [ER-]: Principal | ICD-10-CM

## 2017-05-20 LAB — COMPREHENSIVE METABOLIC PANEL
ALBUMIN: 3.8 g/dL (ref 3.5–5.0)
ALT: 32 U/L (ref 0–55)
ANION GAP: 10 (ref 3–11)
AST: 22 U/L (ref 5–34)
Alkaline Phosphatase: 94 U/L (ref 40–150)
BUN: 12 mg/dL (ref 7–26)
CO2: 26 mmol/L (ref 22–29)
Calcium: 9.2 mg/dL (ref 8.4–10.4)
Chloride: 104 mmol/L (ref 98–109)
Creatinine, Ser: 0.79 mg/dL (ref 0.60–1.10)
GFR calc non Af Amer: >60 mL/min (ref >60)
GLUCOSE: 150 mg/dL — AB (ref 70–140)
POTASSIUM: 3.6 mmol/L (ref 3.5–5.1)
SODIUM: 140 mmol/L (ref 136–145)
Total Bilirubin: 0.4 mg/dL (ref 0.2–1.2)
Total Protein: 6.6 g/dL (ref 6.4–8.3)

## 2017-05-20 LAB — CBC WITH DIFFERENTIAL/PLATELET
BASOS ABS: 0 10*3/uL (ref 0.0–0.1)
Basophils Relative: 1 %
Eosinophils Absolute: 0 10*3/uL (ref 0.0–0.5)
Eosinophils Relative: 1 %
HEMATOCRIT: 28.4 % — AB (ref 34.8–46.6)
HEMOGLOBIN: 9.7 g/dL — AB (ref 11.6–15.9)
LYMPHS PCT: 30 %
Lymphs Abs: 0.5 10*3/uL — ABNORMAL LOW (ref 0.9–3.3)
MCH: 34.8 pg — ABNORMAL HIGH (ref 25.1–34.0)
MCHC: 34.2 g/dL (ref 31.5–36.0)
MCV: 101.8 fL — AB (ref 79.5–101.0)
MONO ABS: 0.1 10*3/uL (ref 0.1–0.9)
Monocytes Relative: 8 %
NEUTROS ABS: 1 10*3/uL — AB (ref 1.5–6.5)
NEUTROS PCT: 60 %
Platelets: 173 10*3/uL (ref 145–400)
RBC: 2.79 MIL/uL — ABNORMAL LOW (ref 3.70–5.45)
RDW: 16.6 % — AB (ref 11.2–14.5)
WBC: 1.6 10*3/uL — ABNORMAL LOW (ref 3.9–10.3)

## 2017-05-20 MED ORDER — SODIUM CHLORIDE 0.9 % IV SOLN
20.0000 mg | Freq: Once | INTRAVENOUS | Status: AC
  Administered 2017-05-20: 20 mg via INTRAVENOUS
  Filled 2017-05-20: qty 2

## 2017-05-20 MED ORDER — SODIUM CHLORIDE 0.9% FLUSH
10.0000 mL | INTRAVENOUS | Status: DC | PRN
  Administered 2017-05-20: 10 mL
  Filled 2017-05-20: qty 10

## 2017-05-20 MED ORDER — SODIUM CHLORIDE 0.9 % IV SOLN
50.0000 mg/m2 | Freq: Once | INTRAVENOUS | Status: AC
  Administered 2017-05-20: 90 mg via INTRAVENOUS
  Filled 2017-05-20: qty 15

## 2017-05-20 MED ORDER — DIPHENHYDRAMINE HCL 50 MG/ML IJ SOLN
INTRAMUSCULAR | Status: AC
  Filled 2017-05-20: qty 1

## 2017-05-20 MED ORDER — HEPARIN SOD (PORK) LOCK FLUSH 100 UNIT/ML IV SOLN
500.0000 [IU] | Freq: Once | INTRAVENOUS | Status: AC | PRN
  Administered 2017-05-20: 500 [IU]
  Filled 2017-05-20: qty 5

## 2017-05-20 MED ORDER — DIPHENHYDRAMINE HCL 50 MG/ML IJ SOLN
50.0000 mg | Freq: Once | INTRAMUSCULAR | Status: AC
  Administered 2017-05-20: 50 mg via INTRAVENOUS

## 2017-05-20 MED ORDER — FAMOTIDINE IN NACL 20-0.9 MG/50ML-% IV SOLN
20.0000 mg | Freq: Once | INTRAVENOUS | Status: AC
  Administered 2017-05-20: 20 mg via INTRAVENOUS

## 2017-05-20 MED ORDER — FAMOTIDINE IN NACL 20-0.9 MG/50ML-% IV SOLN
INTRAVENOUS | Status: AC
  Filled 2017-05-20: qty 50

## 2017-05-20 MED ORDER — SODIUM CHLORIDE 0.9 % IV SOLN
Freq: Once | INTRAVENOUS | Status: AC
  Administered 2017-05-20: 10:00:00 via INTRAVENOUS

## 2017-05-20 NOTE — Progress Notes (Signed)
ANC 1 We will decrease the dosage of Taxol to 50 mg/m

## 2017-05-20 NOTE — Progress Notes (Signed)
Ok to treat with today's lab and vitals per Dr. Lindi Adie- dose will be reduced.

## 2017-05-20 NOTE — Patient Instructions (Addendum)
[facility] Discharge Instructions for Patients Receiving Chemotherapy  Today you received the following chemotherapy agents:  Taxol.  To help prevent nausea and vomiting after your treatment, we encourage you to take your nausea medication as directed.   If you develop nausea and vomiting that is not controlled by your nausea medication, call the clinic.   BELOW ARE SYMPTOMS THAT SHOULD BE REPORTED IMMEDIATELY:  *FEVER GREATER THAN 100.5 F  *CHILLS WITH OR WITHOUT FEVER  NAUSEA AND VOMITING THAT IS NOT CONTROLLED WITH YOUR NAUSEA MEDICATION  *UNUSUAL SHORTNESS OF BREATH  *UNUSUAL BRUISING OR BLEEDING  TENDERNESS IN MOUTH AND THROAT WITH OR WITHOUT PRESENCE OF ULCERS  *URINARY PROBLEMS  *BOWEL PROBLEMS  UNUSUAL RASH Items with * indicate a potential emergency and should be followed up as soon as possible.  Feel free to call the clinic should you have any questions or concerns. The clinic phone number is [phone].  Please show the CHEMO ALERT CARD at check-in to the Emergency Department and triage nurse.   

## 2017-05-27 ENCOUNTER — Inpatient Hospital Stay: Payer: Medicaid Other

## 2017-05-27 ENCOUNTER — Other Ambulatory Visit: Payer: Medicaid Other

## 2017-05-27 ENCOUNTER — Encounter: Payer: Self-pay | Admitting: *Deleted

## 2017-05-27 ENCOUNTER — Ambulatory Visit: Payer: Medicaid Other

## 2017-05-27 ENCOUNTER — Ambulatory Visit: Payer: Medicaid Other | Admitting: Hematology and Oncology

## 2017-05-27 ENCOUNTER — Telehealth: Payer: Self-pay | Admitting: Hematology and Oncology

## 2017-05-27 ENCOUNTER — Inpatient Hospital Stay (HOSPITAL_BASED_OUTPATIENT_CLINIC_OR_DEPARTMENT_OTHER): Payer: Medicaid Other | Admitting: Hematology and Oncology

## 2017-05-27 DIAGNOSIS — Z171 Estrogen receptor negative status [ER-]: Secondary | ICD-10-CM | POA: Diagnosis not present

## 2017-05-27 DIAGNOSIS — C50411 Malignant neoplasm of upper-outer quadrant of right female breast: Secondary | ICD-10-CM

## 2017-05-27 DIAGNOSIS — Z5111 Encounter for antineoplastic chemotherapy: Secondary | ICD-10-CM | POA: Diagnosis not present

## 2017-05-27 DIAGNOSIS — Z95828 Presence of other vascular implants and grafts: Secondary | ICD-10-CM

## 2017-05-27 LAB — CBC WITH DIFFERENTIAL/PLATELET
BASOS PCT: 1 %
Basophils Absolute: 0 10*3/uL (ref 0.0–0.1)
Eosinophils Absolute: 0 10*3/uL (ref 0.0–0.5)
Eosinophils Relative: 1 %
HEMATOCRIT: 25.8 % — AB (ref 34.8–46.6)
Hemoglobin: 8.8 g/dL — ABNORMAL LOW (ref 11.6–15.9)
Lymphocytes Relative: 37 %
Lymphs Abs: 0.6 10*3/uL — ABNORMAL LOW (ref 0.9–3.3)
MCH: 35.1 pg — AB (ref 25.1–34.0)
MCHC: 34.1 g/dL (ref 31.5–36.0)
MCV: 102.8 fL — AB (ref 79.5–101.0)
MONOS PCT: 17 %
Monocytes Absolute: 0.3 10*3/uL (ref 0.1–0.9)
NEUTROS ABS: 0.7 10*3/uL — AB (ref 1.5–6.5)
Neutrophils Relative %: 44 %
PLATELETS: 150 10*3/uL (ref 145–400)
RBC: 2.51 MIL/uL — ABNORMAL LOW (ref 3.70–5.45)
RDW: 17.3 % — ABNORMAL HIGH (ref 11.2–14.5)
WBC: 1.6 10*3/uL — ABNORMAL LOW (ref 3.9–10.3)
nRBC: 2 /100 WBC — ABNORMAL HIGH

## 2017-05-27 LAB — COMPREHENSIVE METABOLIC PANEL
ALT: 35 U/L (ref 0–55)
AST: 25 U/L (ref 5–34)
Albumin: 3.8 g/dL (ref 3.5–5.0)
Alkaline Phosphatase: 87 U/L (ref 40–150)
Anion gap: 11 (ref 3–11)
BILIRUBIN TOTAL: 0.4 mg/dL (ref 0.2–1.2)
BUN: 10 mg/dL (ref 7–26)
CHLORIDE: 103 mmol/L (ref 98–109)
CO2: 25 mmol/L (ref 22–29)
Calcium: 8.9 mg/dL (ref 8.4–10.4)
Creatinine, Ser: 0.76 mg/dL (ref 0.60–1.10)
Glucose, Bld: 128 mg/dL (ref 70–140)
Potassium: 3.3 mmol/L — ABNORMAL LOW (ref 3.5–5.1)
Sodium: 139 mmol/L (ref 136–145)
TOTAL PROTEIN: 6.4 g/dL (ref 6.4–8.3)

## 2017-05-27 MED ORDER — SODIUM CHLORIDE 0.9% FLUSH
10.0000 mL | INTRAVENOUS | Status: DC | PRN
  Administered 2017-05-27: 10 mL via INTRAVENOUS
  Filled 2017-05-27: qty 10

## 2017-05-27 NOTE — Patient Instructions (Signed)
Implanted Port Home Guide An implanted port is a type of central line that is placed under the skin. Central lines are used to provide IV access when treatment or nutrition needs to be given through a person's veins. Implanted ports are used for Schn[place]-term IV access. An implanted port may be placed because:  You need IV medicine that would be irritating to the small veins in your hands or arms.  You need Paar-term IV medicines, such as antibiotics.  You need IV nutrition for a Marzan period.  You need frequent blood draws for lab tests.  You need dialysis.  Implanted ports are usually placed in the chest area, but they can also be placed in the upper arm, the abdomen, or the leg. An implanted port has two main parts:  Reservoir. The reservoir is round and will appear as a small, raised area under your skin. The reservoir is the part where a needle is inserted to give medicines or draw blood.  Catheter. The catheter is a thin, flexible tube that extends from the reservoir. The catheter is placed into a large vein. Medicine that is inserted into the reservoir goes into the catheter and then into the vein.  How will I care for my incision site? Do not get the incision site wet. Bathe or shower as directed by your health care provider. How is my port accessed? Special steps must be taken to access the port:  Before the port is accessed, a numbing cream can be placed on the skin. This helps numb the skin over the port site.  Your health care provider uses a sterile technique to access the port. ? Your health care provider must put on a mask and sterile gloves. ? The skin over your port is cleaned carefully with an antiseptic and allowed to dry. ? The port is gently pinched between sterile gloves, and a needle is inserted into the port.  Only "non-coring" port needles should be used to access the port. Once the port is accessed, a blood return should be checked. This helps ensure that the port  is in the vein and is not clogged.  If your port needs to remain accessed for a constant infusion, a clear (transparent) bandage will be placed over the needle site. The bandage and needle will need to be changed every week, or as directed by your health care provider.  Keep the bandage covering the needle clean and dry. Do not get it wet. Follow your health care provider's instructions on how to take a shower or bath while the port is accessed.  If your port does not need to stay accessed, no bandage is needed over the port.  What is flushing? Flushing helps keep the port from getting clogged. Follow your health care provider's instructions on how and when to flush the port. Ports are usually flushed with saline solution or a medicine called heparin. The need for flushing will depend on how the port is used.  If the port is used for intermittent medicines or blood draws, the port will need to be flushed: ? After medicines have been given. ? After blood has been drawn. ? As part of routine maintenance.  If a constant infusion is running, the port may not need to be flushed.  How Schunk will my port stay implanted? The port can stay in for as [place] as your health care provider thinks it is needed. When it is time for the port to come out, surgery will be   done to remove it. The procedure is similar to the one performed when the port was put in. When should I seek immediate medical care? When you have an implanted port, you should seek immediate medical care if:  You notice a bad smell coming from the incision site.  You have swelling, redness, or drainage at the incision site.  You have more swelling or pain at the port site or the surrounding area.  You have a fever that is not controlled with medicine.  This information is not intended to replace advice given to you by your health care provider. Make sure you discuss any questions you have with your health care provider. Document  Released: 12/29/2004 Document Revised: 06/06/2015 Document Reviewed: 09/05/2012 Elsevier Interactive Patient Education  2017 Elsevier Inc.  

## 2017-05-27 NOTE — Assessment & Plan Note (Signed)
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 T3N0 stage IIIb  01/19/2017:Right breast 10 x 8.3 x 6.9 cm area of cancer involving all 4 quadrants with skin involvement extends from nipple to pectoralis muscle and involving right axillary lymph nodes, right internal mammary nodes  01/22/2017: CT CAP: 1.9 cm dome of the right hepatic lobe suspicious for liver metastases, 9 mm groundglass nodule in the right lung apex could be inflammation versus cancer Liver MRI: Lesion is benign involuted cyst  Treatment plan: 1.Neoadj chemo with dose dense AC foll by Taxoland carboplatin 2. Mastectomy 3. XRT 4.Consideration for participation inSWOG [phone] clinical trial -------------------------------------------------------------------------------------------------------------------------------------------------- Current treatment:Completed 4 cycles ofdose dense Adriamycin Cytoxan, todayiscycle8of weeklyTaxolwith carboplatin(q 3 weeks) Echocardiogram 01/18/17: EF65-70%  Chemo toxicities: 1.  Fatigue 2. Leukopenia/neutropenia 3.  Hypokalemia: I ordered potassium replacement therapy 4 tachycardia.:  No specific etiology  Return to clinic weekly for chemo and every 2 weeks for follow-up with me

## 2017-05-27 NOTE — Progress Notes (Signed)
 Patient Care Team: Yousef, Deema, MD as PCP - General (Family Medicine)  DIAGNOSIS:  Encounter Diagnosis  Name Primary?  . 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  T3N0 stage IIIb      01/19/2017 Breast MRI    Right breast 10 x 8.3 x 6.9 cm area of cancer involving all 4 quadrants with skin involvement extends from nipple to pectoralis muscle and involving right axillary lymph nodes, right internal mammary nodes      01/22/2017 Imaging    CT chest abdomen pelvis reveals a 6 cm right breast mass, mild right axillary lymphadenopathy, 1.9 cm peripheral enhancing lesion in the dome of the right hepatic lobe, 9 mm groundglass nodule right lung apex      01/28/2017 -  Neo-Adjuvant Chemotherapy    Dose dense Adriamycin and Cytoxan  followed with Taxol and carboplatin       CHIEF COMPLIANT: Cycle 8 Taxol    INTERVAL HISTORY: Kathryn [name] is a 56-year with above-mentioned history of right breast cancer currently neoadjuvant chemotherapy and today is cycle 8 of Taxol.  She is tolerating Taxol reasonably well.  She does have fatigue.  She does not have neuropathy.  She has had chronic hypokalemia for which she is on potassium replacement.  She has also chronic tachycardia of unclear etiology.  REVIEW OF SYSTEMS:   Constitutional: Denies fevers, chills or abnormal weight loss Eyes: Denies blurriness of vision Ears, nose, mouth, throat, and face: Denies mucositis or  sore throat Respiratory: Denies cough, dyspnea or wheezes Cardiovascular: Denies palpitation, chest discomfort Gastrointestinal:  Denies nausea, heartburn or change in bowel habits Skin: Denies abnormal skin rashes Lymphatics: Denies new lymphadenopathy or easy bruising Neurological:Denies numbness, tingling or new weaknesses Behavioral/Psych: Mood is stable, no new changes  Extremities: No lower extremity edema  All other systems were reviewed with the patient and are negative.  I have reviewed the past medical history, past surgical history, social history and family history with the patient and they are unchanged from previous note.  ALLERGIES:  has No Known Allergies.  MEDICATIONS:  Current Outpatient Medications  Medication Sig Dispense Refill  . cholecalciferol (VITAMIN D) 1000 UNITS tablet Take 1,000 Units by mouth daily.    . clonazePAM (KLONOPIN) 0.5 MG tablet Take 1 tablet (0.5 mg total) by mouth 3 (three) times daily as needed for anxiety. 90 tablet 5  . cyclobenzaprine (FLEXERIL) 10 MG tablet Take 10 mg by mouth 3 (three) times daily as needed for muscle spasms.    . lidocaine-prilocaine (EMLA) cream Apply to affected area once (Patient taking differently: Apply 1 application topically 3 times/day as needed-between meals & bedtime. Apply to affected area once) 30 g 3  . LORazepam (ATIVAN) 0.5 MG tablet Take 1 tablet (0.5 mg total) by mouth at bedtime as needed (Nausea or vomiting). 30 tablet 0  . magic mouthwash w/lidocaine SOLN Take 5 mLs by mouth 2 (two) times daily as needed for mouth pain. 30 mL 0  .   Misc Natural Products (ACAI+SUPERFRUIT/GREEN TEA) TABS Take 1 tablet by mouth daily.    . Multiple Vitamin (MULITIVITAMIN WITH MINERALS) TABS Take 1 tablet by mouth daily. Plus vit C 500mg    . naproxen (NAPROSYN) 500 MG tablet Take 500 mg by mouth every 12 (twelve) hours as needed.    . ondansetron (ZOFRAN) 8 MG tablet Take 1 tablet (8 mg total) by mouth 2 (two) times daily  as needed. Start on the third day after chemotherapy. 30 tablet 1  . potassium chloride SA (K-DUR,KLOR-CON) 20 MEQ tablet Take 1 tablet (20 mEq total) by mouth 2 (two) times daily. 30 tablet 2  . prochlorperazine (COMPAZINE) 10 MG tablet Take 1 tablet (10 mg total) by mouth every 6 (six) hours as needed (Nausea or vomiting). 30 tablet 1   No current facility-administered medications for this visit.     PHYSICAL EXAMINATION: ECOG PERFORMANCE STATUS: 1 - Symptomatic but completely ambulatory  Vitals:   05/27/17 0858  BP: (!) 135/95  Pulse: (!) 117  Resp: 17  Temp: 98 F (36.7 C)  SpO2: 100%   Filed Weights   05/27/17 0858  Weight: 154 lb 8 oz (70.1 kg)    GENERAL:alert, no distress and comfortable SKIN: skin color, texture, turgor are normal, no rashes or significant lesions EYES: normal, Conjunctiva are pink and non-injected, sclera clear OROPHARYNX:no exudate, no erythema and lips, buccal mucosa, and tongue normal  NECK: supple, thyroid normal size, non-tender, without nodularity LYMPH:  no palpable lymphadenopathy in the cervical, axillary or inguinal LUNGS: clear to auscultation and percussion with normal breathing effort HEART: Tachycardia ABDOMEN:abdomen soft, non-tender and normal bowel sounds MUSCULOSKELETAL:no cyanosis of digits and no clubbing  NEURO: alert & oriented x 3 with fluent speech, no focal motor/sensory deficits EXTREMITIES: No lower extremity edema  LABORATORY DATA:  I have reviewed the data as listed CMP Latest Ref Rng & Units 05/20/2017 05/13/2017 05/06/2017  Glucose 70 - 140 mg/dL 150(H) 234(H) 183(H)  BUN 7 - 26 mg/dL 12 10 10  Creatinine 0.60 - 1.10 mg/dL 0.79 0.81 0.75  Sodium 136 - 145 mmol/L 140 138 141  Potassium 3.5 - 5.1 mmol/L 3.6 3.3(L) 3.1(L)  Chloride 98 - 109 mmol/L 104 105 107  CO2 22 - 29 mmol/L 26 23 23  Calcium 8.4 - 10.4 mg/dL 9.2 8.9 8.7  Total Protein 6.4 - 8.3 g/dL 6.6 6.3(L) 6.0(L)  Total Bilirubin 0.2 - 1.2 mg/dL 0.4 0.3 0.3    Alkaline Phos 40 - 150 U/L 94 99 95  AST 5 - 34 U/L 22 21 19  ALT 0 - 55 U/L 32 33 28    Lab Results  Component Value Date   WBC 1.6 (L) 05/27/2017   HGB 8.8 (L) 05/27/2017   HCT 25.8 (L) 05/27/2017   MCV 102.8 (H) 05/27/2017   PLT 150 05/27/2017   NEUTROABS 0.7 (L) 05/27/2017    ASSESSMENT & PLAN:  Malignant neoplasm of upper-outer quadrant of right breast in female, estrogen receptor negative (HCC) 12/29/2016: Palpable right breast lump for 6 months, mammogram revealed 6 cm mass at 1 o'clock position 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 T3N0 stage IIIb  01/19/2017:Right breast 10 x 8.3 x 6.9 cm area of cancer involving all 4 quadrants with skin involvement extends from nipple to pectoralis muscle and involving right axillary lymph nodes, right internal   mammary nodes  01/22/2017: CT CAP: 1.9 cm dome of the right hepatic lobe suspicious for liver metastases, 9 mm groundglass nodule in the right lung apex could be inflammation versus cancer Liver MRI: Lesion is benign involuted cyst  Treatment plan: 1.Neoadj chemo with dose dense AC foll by Taxoland carboplatin 2. Mastectomy 3. XRT 4.Consideration for participation inSWOG [phone] clinical trial -------------------------------------------------------------------------------------------------------------------------------------------------- Current treatment:Completed 4 cycles ofdose dense Adriamycin Cytoxan, todayiscycle8of weeklyTaxolwith carboplatin(q 3 weeks) Echocardiogram 01/18/17: EF65-70%  Chemo toxicities: 1.  Fatigue 2. Leukopenia/neutropenia: Patient's ANC 0.7.  We will hold off on doing chemotherapy today.  She will come back next week with Granix 2 days before her treatment. Patient is disappointed about this but she understands importance of  doing it safely. 3.  Hypokalemia: I ordered potassium replacement therapy 4 tachycardia.:  No specific etiology  Return to clinic weekly for chemo and every 2 weeks for follow-up with me      No orders of the defined types were placed in this encounter.  The patient has a good understanding of the overall plan. she agrees with it. she will call with any problems that may develop before the next visit here.   Harriette Ohara, MD 05/27/17

## 2017-05-27 NOTE — Telephone Encounter (Signed)
Gave patient avs report and appointments for May/June. Per 5/16 los granix injection 2 days prior to each treatment. Added chemo according to care plan, lab/flush (standing orders) and injections according to los.

## 2017-05-31 ENCOUNTER — Telehealth: Payer: Self-pay | Admitting: Nurse Practitioner

## 2017-05-31 MED ORDER — CLONAZEPAM 0.5 MG PO TABS: 0.5000 mg | ORAL_TABLET | Freq: Three times a day (TID) | ORAL | 5 refills | Status: DC | PRN

## 2017-05-31 NOTE — Telephone Encounter (Signed)
Clonazepam refill RX [facility] to [facility] [employer]. [facility] will [facility].

## 2017-05-31 NOTE — Telephone Encounter (Signed)
Ebony Hail with [facility] called requesting a refill for clonazePAM (KLONOPIN) 0.5 MG tablet

## 2017-05-31 NOTE — Telephone Encounter (Signed)
According to the narcotic registry,for Kathryn [name] I sent in a refill on 04/16/2017 for 90 tablets with 3 refills. To [facility]

## 2017-05-31 NOTE — Telephone Encounter (Signed)
Called [facility], spoke with Clair Gulling to advise of NP's message. He stated that they never received a refill in April. He stated the last refill Rx they have on file is from Oct 28, 2016. He stated per [place] registry patient last picked up refill on 04/16/17 which was from Rx on 10/28/16. She has no further refills on file at [facility]. Will discuss with NP.

## 2017-06-01 ENCOUNTER — Inpatient Hospital Stay: Payer: Medicaid Other

## 2017-06-01 VITALS — BP 147/90 | HR 126 | Resp 18

## 2017-06-01 DIAGNOSIS — C50411 Malignant neoplasm of upper-outer quadrant of right female breast: Secondary | ICD-10-CM

## 2017-06-01 DIAGNOSIS — Z171 Estrogen receptor negative status [ER-]: Principal | ICD-10-CM

## 2017-06-01 DIAGNOSIS — Z5111 Encounter for antineoplastic chemotherapy: Secondary | ICD-10-CM | POA: Diagnosis not present

## 2017-06-01 MED ORDER — TBO-FILGRASTIM 480 MCG/0.8ML ~~LOC~~ SOSY
PREFILLED_SYRINGE | SUBCUTANEOUS | Status: AC
  Filled 2017-06-01: qty 0.8

## 2017-06-01 MED ORDER — TBO-FILGRASTIM 480 MCG/0.8ML ~~LOC~~ SOSY
480.0000 ug | PREFILLED_SYRINGE | Freq: Once | SUBCUTANEOUS | Status: AC
  Administered 2017-06-01: 480 ug via SUBCUTANEOUS

## 2017-06-03 ENCOUNTER — Inpatient Hospital Stay: Payer: Medicaid Other

## 2017-06-03 VITALS — BP 132/70 | HR 115 | Temp 98.1°F | Resp 18

## 2017-06-03 DIAGNOSIS — C50411 Malignant neoplasm of upper-outer quadrant of right female breast: Secondary | ICD-10-CM

## 2017-06-03 DIAGNOSIS — Z171 Estrogen receptor negative status [ER-]: Principal | ICD-10-CM

## 2017-06-03 DIAGNOSIS — Z5111 Encounter for antineoplastic chemotherapy: Secondary | ICD-10-CM | POA: Diagnosis not present

## 2017-06-03 DIAGNOSIS — Z95828 Presence of other vascular implants and grafts: Secondary | ICD-10-CM

## 2017-06-03 LAB — CBC WITH DIFFERENTIAL/PLATELET
BASOS ABS: 0 10*3/uL (ref 0.0–0.1)
Basophils Relative: 0 %
Eosinophils Absolute: 0 10*3/uL (ref 0.0–0.5)
Eosinophils Relative: 1 %
HEMATOCRIT: 29.2 % — AB (ref 34.8–46.6)
Hemoglobin: 9.7 g/dL — ABNORMAL LOW (ref 11.6–15.9)
LYMPHS PCT: 13 %
Lymphs Abs: 0.8 10*3/uL — ABNORMAL LOW (ref 0.9–3.3)
MCH: 35.3 pg — ABNORMAL HIGH (ref 25.1–34.0)
MCHC: 33.2 g/dL (ref 31.5–36.0)
MCV: 106.2 fL — AB (ref 79.5–101.0)
MONO ABS: 0.6 10*3/uL (ref 0.1–0.9)
MONOS PCT: 10 %
NEUTROS ABS: 4.6 10*3/uL (ref 1.5–6.5)
Neutrophils Relative %: 76 %
Platelets: 126 10*3/uL — ABNORMAL LOW (ref 145–400)
RBC: 2.75 MIL/uL — ABNORMAL LOW (ref 3.70–5.45)
RDW: 18.9 % — AB (ref 11.2–14.5)
WBC: 6.1 10*3/uL (ref 3.9–10.3)

## 2017-06-03 LAB — COMPREHENSIVE METABOLIC PANEL
ALK PHOS: 99 U/L (ref 40–150)
ALT: 37 U/L (ref 0–55)
AST: 26 U/L (ref 5–34)
Albumin: 3.9 g/dL (ref 3.5–5.0)
Anion gap: 10 (ref 3–11)
BILIRUBIN TOTAL: 0.4 mg/dL (ref 0.2–1.2)
BUN: 8 mg/dL (ref 7–26)
CALCIUM: 9.4 mg/dL (ref 8.4–10.4)
CO2: 27 mmol/L (ref 22–29)
CREATININE: 0.79 mg/dL (ref 0.60–1.10)
Chloride: 105 mmol/L (ref 98–109)
GFR calc Af Amer: >60 mL/min (ref >60)
Glucose, Bld: 124 mg/dL (ref 70–140)
POTASSIUM: 3.5 mmol/L (ref 3.5–5.1)
Sodium: 142 mmol/L (ref 136–145)
TOTAL PROTEIN: 6.7 g/dL (ref 6.4–8.3)

## 2017-06-03 MED ORDER — DIPHENHYDRAMINE HCL 50 MG/ML IJ SOLN
INTRAMUSCULAR | Status: AC
Start: 2017-06-03 — End: ?
  Filled 2017-06-03: qty 1

## 2017-06-03 MED ORDER — SODIUM CHLORIDE 0.9% FLUSH
10.0000 mL | INTRAVENOUS | Status: DC | PRN
  Administered 2017-06-03: 10 mL
  Filled 2017-06-03: qty 10

## 2017-06-03 MED ORDER — HEPARIN SOD (PORK) LOCK FLUSH 100 UNIT/ML IV SOLN
500.0000 [IU] | Freq: Once | INTRAVENOUS | Status: AC | PRN
  Administered 2017-06-03: 500 [IU]
  Filled 2017-06-03: qty 5

## 2017-06-03 MED ORDER — SODIUM CHLORIDE 0.9 % IV SOLN
20.0000 mg | Freq: Once | INTRAVENOUS | Status: AC
  Administered 2017-06-03: 20 mg via INTRAVENOUS
  Filled 2017-06-03: qty 2

## 2017-06-03 MED ORDER — SODIUM CHLORIDE 0.9% FLUSH
10.0000 mL | INTRAVENOUS | Status: DC | PRN
Start: 2017-06-03 — End: 2017-06-03
  Administered 2017-06-03: 10 mL via INTRAVENOUS
  Filled 2017-06-03: qty 10

## 2017-06-03 MED ORDER — FAMOTIDINE IN NACL 20-0.9 MG/50ML-% IV SOLN
INTRAVENOUS | Status: AC
  Filled 2017-06-03: qty 50

## 2017-06-03 MED ORDER — SODIUM CHLORIDE 0.9 % IV SOLN
50.0000 mg/m2 | Freq: Once | INTRAVENOUS | Status: AC
  Administered 2017-06-03: 90 mg via INTRAVENOUS
  Filled 2017-06-03: qty 15

## 2017-06-03 MED ORDER — DIPHENHYDRAMINE HCL 50 MG/ML IJ SOLN
50.0000 mg | Freq: Once | INTRAMUSCULAR | Status: AC
  Administered 2017-06-03: 50 mg via INTRAVENOUS

## 2017-06-03 MED ORDER — FAMOTIDINE IN NACL 20-0.9 MG/50ML-% IV SOLN
20.0000 mg | Freq: Once | INTRAVENOUS | Status: AC
  Administered 2017-06-03: 20 mg via INTRAVENOUS

## 2017-06-03 MED ORDER — SODIUM CHLORIDE 0.9 % IV SOLN
Freq: Once | INTRAVENOUS | Status: AC
  Administered 2017-06-03: 12:00:00 via INTRAVENOUS

## 2017-06-08 ENCOUNTER — Inpatient Hospital Stay: Payer: Medicaid Other

## 2017-06-08 VITALS — BP 140/78 | HR 110 | Temp 98.0°F | Resp 20

## 2017-06-08 DIAGNOSIS — C50411 Malignant neoplasm of upper-outer quadrant of right female breast: Secondary | ICD-10-CM

## 2017-06-08 DIAGNOSIS — Z171 Estrogen receptor negative status [ER-]: Principal | ICD-10-CM

## 2017-06-08 DIAGNOSIS — Z5111 Encounter for antineoplastic chemotherapy: Secondary | ICD-10-CM | POA: Diagnosis not present

## 2017-06-08 MED ORDER — TBO-FILGRASTIM 480 MCG/0.8ML ~~LOC~~ SOSY
480.0000 ug | PREFILLED_SYRINGE | Freq: Once | SUBCUTANEOUS | Status: AC
  Administered 2017-06-08: 480 ug via SUBCUTANEOUS

## 2017-06-08 MED ORDER — TBO-FILGRASTIM 480 MCG/0.8ML ~~LOC~~ SOSY
PREFILLED_SYRINGE | SUBCUTANEOUS | Status: AC
  Filled 2017-06-08: qty 0.8

## 2017-06-08 NOTE — Patient Instructions (Signed)
Tbo-Filgrastim injection What is this medicine? TBO-FILGRASTIM (T B O fil GRA stim) is a granulocyte colony-stimulating factor that stimulates the growth of neutrophils, a type of white blood cell important in the body's fight against infection. It is used to reduce the incidence of fever and infection in patients with certain types of cancer who are receiving chemotherapy that affects the bone marrow. This medicine may be used for other purposes; ask your health care provider or pharmacist if you have questions. COMMON BRAND NAME(S): Granix What should I tell my health care provider before I take this medicine? They need to know if you have any of these conditions: -bone scan or tests planned -kidney disease -sickle cell anemia -an unusual or allergic reaction to tbo-filgrastim, filgrastim, pegfilgrastim, other medicines, foods, dyes, or preservatives -pregnant or trying to get pregnant -breast-feeding How should I use this medicine? This medicine is for injection under the skin. If you get this medicine at home, you will be taught how to prepare and give this medicine. Refer to the Instructions for Use that come with your medication packaging. Use exactly as directed. Take your medicine at regular intervals. Do not take your medicine more often than directed. It is important that you put your used needles and syringes in a special sharps container. Do not put them in a trash can. If you do not have a sharps container, call your pharmacist or healthcare provider to get one. Talk to your pediatrician regarding the use of this medicine in children. Special care may be needed. Overdosage: If you think you have taken too much of this medicine contact a poison control center or emergency room at once. NOTE: This medicine is only for you. Do not share this medicine with others. What if I miss a dose? It is important not to miss your dose. Call your doctor or health care professional if you miss a  dose. What may interact with this medicine? This medicine may interact with the following medications: -medicines that may cause a release of neutrophils, such as lithium This list may not describe all possible interactions. Give your health care provider a list of all the medicines, herbs, non-prescription drugs, or dietary supplements you use. Also tell them if you smoke, drink alcohol, or use illegal drugs. Some items may interact with your medicine. What should I watch for while using this medicine? You may need blood work done while you are taking this medicine. What side effects may I notice from receiving this medicine? Side effects that you should report to your doctor or health care professional as soon as possible: -allergic reactions like skin rash, itching or hives, swelling of the face, lips, or tongue -blood in the urine -dark urine -dizziness -fast heartbeat -feeling faint -shortness of breath or breathing problems -signs and symptoms of infection like fever or chills; cough; or sore throat -signs and symptoms of kidney injury like trouble passing urine or change in the amount of urine -stomach or side pain, or pain at the shoulder -sweating -swelling of the legs, ankles, or abdomen -tiredness Side effects that usually do not require medical attention (report to your doctor or health care professional if they continue or are bothersome): -bone pain -headache -muscle pain -vomiting This list may not describe all possible side effects. Call your doctor for medical advice about side effects. You may report side effects to FDA at 1-800-FDA-1088. Where should I keep my medicine? Keep out of the reach of children. Store in a refrigerator between   2 and 8 degrees C (36 and 46 degrees F). Keep in carton to protect from light. Throw away this medicine if it is left out of the refrigerator for more than 5 consecutive days. Throw away any unused medicine after the expiration  date. NOTE: This sheet is a summary. It may not cover all possible information. If you have questions about this medicine, talk to your doctor, pharmacist, or health care provider.  2018 Elsevier/Gold Standard (2015-02-18 19:07:04)  

## 2017-06-10 ENCOUNTER — Inpatient Hospital Stay: Payer: Medicaid Other

## 2017-06-10 ENCOUNTER — Telehealth: Payer: Self-pay

## 2017-06-10 VITALS — BP 134/89 | HR 116 | Temp 98.2°F | Resp 18 | Wt 152.5 lb

## 2017-06-10 DIAGNOSIS — C50411 Malignant neoplasm of upper-outer quadrant of right female breast: Secondary | ICD-10-CM

## 2017-06-10 DIAGNOSIS — Z171 Estrogen receptor negative status [ER-]: Principal | ICD-10-CM

## 2017-06-10 DIAGNOSIS — Z5111 Encounter for antineoplastic chemotherapy: Secondary | ICD-10-CM | POA: Diagnosis not present

## 2017-06-10 LAB — CBC WITH DIFFERENTIAL/PLATELET
BASOS ABS: 0 10*3/uL (ref 0.0–0.1)
Basophils Relative: 0 %
EOS PCT: 1 %
Eosinophils Absolute: 0.1 10*3/uL (ref 0.0–0.5)
HEMATOCRIT: 29.2 % — AB (ref 34.8–46.6)
HEMOGLOBIN: 9.8 g/dL — AB (ref 11.6–15.9)
LYMPHS PCT: 14 %
Lymphs Abs: 1.1 10*3/uL (ref 0.9–3.3)
MCH: 35.8 pg — ABNORMAL HIGH (ref 25.1–34.0)
MCHC: 33.6 g/dL (ref 31.5–36.0)
MCV: 106.6 fL — AB (ref 79.5–101.0)
MONO ABS: 0.4 10*3/uL (ref 0.1–0.9)
Monocytes Relative: 6 %
NEUTROS ABS: 6 10*3/uL (ref 1.5–6.5)
Neutrophils Relative %: 79 %
Platelets: 262 10*3/uL (ref 145–400)
RBC: 2.74 MIL/uL — ABNORMAL LOW (ref 3.70–5.45)
RDW: 17.5 % — AB (ref 11.2–14.5)
WBC: 7.6 10*3/uL (ref 3.9–10.3)
nRBC: 1 /100 WBC — ABNORMAL HIGH

## 2017-06-10 LAB — COMPREHENSIVE METABOLIC PANEL
ALBUMIN: 3.9 g/dL (ref 3.5–5.0)
ALT: 26 U/L (ref 0–55)
ANION GAP: 14 — AB (ref 3–11)
AST: 22 U/L (ref 5–34)
Alkaline Phosphatase: 100 U/L (ref 40–150)
BILIRUBIN TOTAL: 0.3 mg/dL (ref 0.2–1.2)
BUN: 9 mg/dL (ref 7–26)
CO2: 22 mmol/L (ref 22–29)
Calcium: 9.1 mg/dL (ref 8.4–10.4)
Chloride: 103 mmol/L (ref 98–109)
Creatinine, Ser: 0.86 mg/dL (ref 0.60–1.10)
GFR calc Af Amer: >60 mL/min (ref >60)
GFR calc non Af Amer: >60 mL/min (ref >60)
GLUCOSE: 198 mg/dL — AB (ref 70–140)
POTASSIUM: 3.2 mmol/L — AB (ref 3.5–5.1)
SODIUM: 139 mmol/L (ref 136–145)
Total Protein: 6.6 g/dL (ref 6.4–8.3)

## 2017-06-10 MED ORDER — FAMOTIDINE IN NACL 20-0.9 MG/50ML-% IV SOLN
20.0000 mg | Freq: Once | INTRAVENOUS | Status: AC
  Administered 2017-06-10: 20 mg via INTRAVENOUS

## 2017-06-10 MED ORDER — FAMOTIDINE IN NACL 20-0.9 MG/50ML-% IV SOLN
INTRAVENOUS | Status: AC
  Filled 2017-06-10: qty 50

## 2017-06-10 MED ORDER — PALONOSETRON HCL INJECTION 0.25 MG/5ML
0.2500 mg | Freq: Once | INTRAVENOUS | Status: AC
  Administered 2017-06-10: 0.25 mg via INTRAVENOUS

## 2017-06-10 MED ORDER — SODIUM CHLORIDE 0.9 % IV SOLN
Freq: Once | INTRAVENOUS | Status: AC
  Administered 2017-06-10: 10:00:00 via INTRAVENOUS
  Filled 2017-06-10: qty 5

## 2017-06-10 MED ORDER — SODIUM CHLORIDE 0.9 % IV SOLN
50.0000 mg/m2 | Freq: Once | INTRAVENOUS | Status: AC
  Administered 2017-06-10: 90 mg via INTRAVENOUS
  Filled 2017-06-10: qty 15

## 2017-06-10 MED ORDER — SODIUM CHLORIDE 0.9% FLUSH
10.0000 mL | INTRAVENOUS | Status: DC | PRN
  Administered 2017-06-10: 10 mL
  Filled 2017-06-10: qty 10

## 2017-06-10 MED ORDER — HEPARIN SOD (PORK) LOCK FLUSH 100 UNIT/ML IV SOLN
500.0000 [IU] | Freq: Once | INTRAVENOUS | Status: AC | PRN
  Administered 2017-06-10: 500 [IU]
  Filled 2017-06-10: qty 5

## 2017-06-10 MED ORDER — DIPHENHYDRAMINE HCL 50 MG/ML IJ SOLN
INTRAMUSCULAR | Status: AC
  Filled 2017-06-10: qty 1

## 2017-06-10 MED ORDER — SODIUM CHLORIDE 0.9 % IV SOLN
Freq: Once | INTRAVENOUS | Status: AC
  Administered 2017-06-10: 09:00:00 via INTRAVENOUS

## 2017-06-10 MED ORDER — PALONOSETRON HCL INJECTION 0.25 MG/5ML
INTRAVENOUS | Status: AC
  Filled 2017-06-10: qty 5

## 2017-06-10 MED ORDER — SODIUM CHLORIDE 0.9 % IV SOLN
539.0000 mg | Freq: Once | INTRAVENOUS | Status: AC
  Administered 2017-06-10: 540 mg via INTRAVENOUS
  Filled 2017-06-10: qty 54

## 2017-06-10 MED ORDER — DIPHENHYDRAMINE HCL 50 MG/ML IJ SOLN
50.0000 mg | Freq: Once | INTRAMUSCULAR | Status: AC
  Administered 2017-06-10: 50 mg via INTRAVENOUS

## 2017-06-10 NOTE — Patient Instructions (Signed)
[facility] Discharge Instructions for Patients Receiving Chemotherapy  Today you received the following chemotherapy agents:  Taxol.  To help prevent nausea and vomiting after your treatment, we encourage you to take your nausea medication as directed.   If you develop nausea and vomiting that is not controlled by your nausea medication, call the clinic.   BELOW ARE SYMPTOMS THAT SHOULD BE REPORTED IMMEDIATELY:  *FEVER GREATER THAN 100.5 F  *CHILLS WITH OR WITHOUT FEVER  NAUSEA AND VOMITING THAT IS NOT CONTROLLED WITH YOUR NAUSEA MEDICATION  *UNUSUAL SHORTNESS OF BREATH  *UNUSUAL BRUISING OR BLEEDING  TENDERNESS IN MOUTH AND THROAT WITH OR WITHOUT PRESENCE OF ULCERS  *URINARY PROBLEMS  *BOWEL PROBLEMS  UNUSUAL RASH Items with * indicate a potential emergency and should be followed up as soon as possible.  Feel free to call the clinic should you have any questions or concerns. The clinic phone number is [phone].  Please show the CHEMO ALERT CARD at check-in to the Emergency Department and triage nurse.   

## 2017-06-10 NOTE — Telephone Encounter (Signed)
Melia RN in infusion called in regards to pt is reporting she is having some neuropathy in her hands and feet, it's on and off, and not that intense- she wanted to make Dr Lindi Adie aware.  Notified Dr Lindi Adie and per him, call in prescription for Gabapentin 100 mg by mouth at bedtime.  #30 with 3 refills.  [facility] [address].  Melia RN has let pt know about prescription for neuropathy and to report how it is helping and if symptoms are worsening.

## 2017-06-15 ENCOUNTER — Other Ambulatory Visit: Payer: Self-pay | Admitting: Hematology and Oncology

## 2017-06-15 ENCOUNTER — Inpatient Hospital Stay: Payer: Medicaid Other | Attending: Radiation Oncology

## 2017-06-15 VITALS — BP 134/78 | HR 98 | Temp 98.2°F | Resp 20

## 2017-06-15 DIAGNOSIS — E876 Hypokalemia: Secondary | ICD-10-CM | POA: Diagnosis not present

## 2017-06-15 DIAGNOSIS — C50411 Malignant neoplasm of upper-outer quadrant of right female breast: Secondary | ICD-10-CM | POA: Insufficient documentation

## 2017-06-15 DIAGNOSIS — Z171 Estrogen receptor negative status [ER-]: Secondary | ICD-10-CM | POA: Diagnosis not present

## 2017-06-15 DIAGNOSIS — C50911 Malignant neoplasm of unspecified site of right female breast: Secondary | ICD-10-CM

## 2017-06-15 DIAGNOSIS — Z9221 Personal history of antineoplastic chemotherapy: Secondary | ICD-10-CM | POA: Diagnosis not present

## 2017-06-15 DIAGNOSIS — T451X5S Adverse effect of antineoplastic and immunosuppressive drugs, sequela: Secondary | ICD-10-CM | POA: Diagnosis not present

## 2017-06-15 DIAGNOSIS — D701 Agranulocytosis secondary to cancer chemotherapy: Secondary | ICD-10-CM | POA: Insufficient documentation

## 2017-06-15 DIAGNOSIS — R000 Tachycardia, unspecified: Secondary | ICD-10-CM | POA: Diagnosis not present

## 2017-06-15 DIAGNOSIS — Z79899 Other long term (current) drug therapy: Secondary | ICD-10-CM | POA: Insufficient documentation

## 2017-06-15 DIAGNOSIS — Z5111 Encounter for antineoplastic chemotherapy: Secondary | ICD-10-CM | POA: Diagnosis not present

## 2017-06-15 DIAGNOSIS — R5383 Other fatigue: Secondary | ICD-10-CM | POA: Diagnosis not present

## 2017-06-15 DIAGNOSIS — Z9011 Acquired absence of right breast and nipple: Secondary | ICD-10-CM | POA: Diagnosis not present

## 2017-06-15 DIAGNOSIS — Z923 Personal history of irradiation: Secondary | ICD-10-CM | POA: Diagnosis not present

## 2017-06-15 MED ORDER — TBO-FILGRASTIM 480 MCG/0.8ML ~~LOC~~ SOSY: 480.0000 ug | PREFILLED_SYRINGE | Freq: Once | SUBCUTANEOUS | Status: DC

## 2017-06-15 MED ORDER — TBO-FILGRASTIM 480 MCG/0.8ML ~~LOC~~ SOSY
480.0000 ug | PREFILLED_SYRINGE | Freq: Once | SUBCUTANEOUS | Status: AC
  Administered 2017-06-15: 480 ug via SUBCUTANEOUS

## 2017-06-15 MED ORDER — TBO-FILGRASTIM 480 MCG/0.8ML ~~LOC~~ SOSY
PREFILLED_SYRINGE | SUBCUTANEOUS | Status: AC
  Filled 2017-06-15: qty 0.8

## 2017-06-15 NOTE — Patient Instructions (Signed)
Tbo-Filgrastim injection What is this medicine? TBO-FILGRASTIM (T B O fil GRA stim) is a granulocyte colony-stimulating factor that stimulates the growth of neutrophils, a type of white blood cell important in the body's fight against infection. It is used to reduce the incidence of fever and infection in patients with certain types of cancer who are receiving chemotherapy that affects the bone marrow. This medicine may be used for other purposes; ask your health care provider or pharmacist if you have questions. COMMON BRAND NAME(S): Granix What should I tell my health care provider before I take this medicine? They need to know if you have any of these conditions: -bone scan or tests planned -kidney disease -sickle cell anemia -an unusual or allergic reaction to tbo-filgrastim, filgrastim, pegfilgrastim, other medicines, foods, dyes, or preservatives -pregnant or trying to get pregnant -breast-feeding How should I use this medicine? This medicine is for injection under the skin. If you get this medicine at home, you will be taught how to prepare and give this medicine. Refer to the Instructions for Use that come with your medication packaging. Use exactly as directed. Take your medicine at regular intervals. Do not take your medicine more often than directed. It is important that you put your used needles and syringes in a special sharps container. Do not put them in a trash can. If you do not have a sharps container, call your pharmacist or healthcare provider to get one. Talk to your pediatrician regarding the use of this medicine in children. Special care may be needed. Overdosage: If you think you have taken too much of this medicine contact a poison control center or emergency room at once. NOTE: This medicine is only for you. Do not share this medicine with others. What if I miss a dose? It is important not to miss your dose. Call your doctor or health care professional if you miss a  dose. What may interact with this medicine? This medicine may interact with the following medications: -medicines that may cause a release of neutrophils, such as lithium This list may not describe all possible interactions. Give your health care provider a list of all the medicines, herbs, non-prescription drugs, or dietary supplements you use. Also tell them if you smoke, drink alcohol, or use illegal drugs. Some items may interact with your medicine. What should I watch for while using this medicine? You may need blood work done while you are taking this medicine. What side effects may I notice from receiving this medicine? Side effects that you should report to your doctor or health care professional as soon as possible: -allergic reactions like skin rash, itching or hives, swelling of the face, lips, or tongue -blood in the urine -dark urine -dizziness -fast heartbeat -feeling faint -shortness of breath or breathing problems -signs and symptoms of infection like fever or chills; cough; or sore throat -signs and symptoms of kidney injury like trouble passing urine or change in the amount of urine -stomach or side pain, or pain at the shoulder -sweating -swelling of the legs, ankles, or abdomen -tiredness Side effects that usually do not require medical attention (report to your doctor or health care professional if they continue or are bothersome): -bone pain -headache -muscle pain -vomiting This list may not describe all possible side effects. Call your doctor for medical advice about side effects. You may report side effects to FDA at 1-800-FDA-1088. Where should I keep my medicine? Keep out of the reach of children. Store in a refrigerator between   2 and 8 degrees C (36 and 46 degrees F). Keep in carton to protect from light. Throw away this medicine if it is left out of the refrigerator for more than 5 consecutive days. Throw away any unused medicine after the expiration  date. NOTE: This sheet is a summary. It may not cover all possible information. If you have questions about this medicine, talk to your doctor, pharmacist, or health care provider.  2018 Elsevier/Gold Standard (2015-02-18 19:07:04)  

## 2017-06-16 ENCOUNTER — Other Ambulatory Visit: Payer: Self-pay

## 2017-06-16 ENCOUNTER — Ambulatory Visit: Payer: Medicaid Other | Attending: Family Medicine

## 2017-06-16 DIAGNOSIS — R293 Abnormal posture: Secondary | ICD-10-CM

## 2017-06-16 DIAGNOSIS — R252 Cramp and spasm: Secondary | ICD-10-CM | POA: Diagnosis present

## 2017-06-16 DIAGNOSIS — M542 Cervicalgia: Secondary | ICD-10-CM | POA: Diagnosis not present

## 2017-06-16 NOTE — Patient Instructions (Signed)
Access Code: [email or web address]  URL: [email or web address]  Date: 06/16/2017  Prepared by: Sigurd Sos   Exercises  Sternocleidomastoid Stretch - 3 reps - 1 sets - 20 hold - 3x daily - 7x weekly  Supine Chin Tuck - 10 reps - 3 sets - 5 hold - 3x daily - 7x weekly  Standing Cervical Retraction - 3 reps - 1 sets - 5 hold - 3x daily - 7x weekly

## 2017-06-16 NOTE — Therapy (Signed)
[facility] [address], [place], [place], [address] Phone: [phone]   Fax:  [phone]  Physical Therapy Evaluation  Patient Details  Name: Kathryn [name] MRN: [id number] Date of Birth: 1961/01/10 Referring Provider: Dixon Boos, MD   Encounter Date: 06/16/2017  PT End of Session - 06/16/17 1505    Visit Number  1    Date for PT Re-Evaluation  08/11/17    Authorization Type  Medicaid-submitted approval 06/16/17    PT Start Time  1410    PT Stop Time  1450    PT Time Calculation (min)  40 min    Activity Tolerance  Treatment limited secondary to medical complications (Comment)    Behavior During Therapy  [facility] for tasks assessed/performed       Past Medical History:  Diagnosis Date  . Anxiety   . Cancer ([place])   . Depression with anxiety   . Endometrioma   . Endometriosis   . Mass of pelvis   . Torticollis, unspecified NECK MUSCLE--  OCCASIONAL    Past Surgical History:  Procedure Laterality Date  . ABDOMINAL HYSTERECTOMY  06/29/2011   Procedure: HYSTERECTOMY ABDOMINAL;  Surgeon: Selinda Orion, MD;  Location: [facility];  Service: Gynecology;  Laterality: N/A;  PROCEDURE READS: EXPLORATORY LAPAROTOMY, TOTAL ABDOMINAL HYSTERECTOMY WITH BSO AND  SUPERCERVICAL HYSTERECTOMY WITH BSO AND EXCISION OF ENDOMETRIOMIAS OWER  . BENIGN BREAST CYST REMOVED  YRS AGO  . ENDOMETRIAL ABLATION  06/29/2011   Procedure: ENDOMETRIAL ABLATION;  Surgeon: Selinda Orion, MD;  Location: [facility];  Service: Gynecology;  Laterality: N/A;  . EXCISION OF A HIDRADENITIS ABSCESS, LEFT INGUINAL AREA  03-30-2005  . IR FLUORO GUIDE PORT INSERTION LEFT  01/26/2017  . IR US GUIDE VASC ACCESS LEFT  01/26/2017  . LAPAROTOMY  06/29/2011   Procedure: EXPLORATORY LAPAROTOMY;  Surgeon: Selinda Orion, MD;  Location: [facility];  Service: Gynecology;  Laterality: N/A;  . SALPINGOOPHORECTOMY   06/29/2011   Procedure: SALPINGO OOPHERECTOMY;  Surgeon: Selinda Orion, MD;  Location: [facility];  Service: Gynecology;  Laterality: Bilateral;    There were no vitals filed for this visit.   Subjective Assessment - 06/16/17 1412    Subjective  Pt presents to PT with 6-7 year history of torticollis.  Pt is not sure of the cause of this.  Pt had Botox injections in the past and pt didn't see much result.  Pt is going through chemotherapy for breast cancer.  Pt reports that cervical mobility is limited but no pain.      Pertinent History  Rt breast cancer: no surgery.  Chemotherapy now (started 01/2017-present).  torticollis: rotation to the Lt and sidebending Lt     Patient Stated Goals  improve cervical A/ROM    Currently in Pain?  No/denies         [facility] PT Assessment - 06/16/17 0001      Assessment   Medical Diagnosis  torticollis/cervical dystonia    Referring Provider  Dixon Boos, MD    Onset Date/Surgical Date  06/17/10    Next MD Visit  nothing scheduled    Prior Therapy  none      Precautions   Precautions  Other (comment) Rt breast cancer- active chemotherapy      Restrictions   Weight Bearing Restrictions  No      Balance Screen   Has the patient fallen in the past 6 months  No    Has the patient had a decrease in activity level because of a fear of falling?   No    Is the patient reluctant to leave their home because of a fear of falling?   No      Home Film/video editor residence      Prior Function   Level of Independence  Independent    Vocation  Unemployed    Leisure  arts/crafts      Cognition   Overall Cognitive Status  Within Functional Limits for tasks assessed      Posture/Postural Control   Posture/Postural Control  Postural limitations    Posture Comments  Lt cervical rotation and Rt sidebending in sitting      ROM / Strength   AROM / PROM / Strength  AROM;PROM;Strength      AROM   Overall AROM    Deficits    Overall AROM Comments  UE A/ROM is full without pain.  Significant muscular compensatory motion when performing cervical A/ROM into sidebending and rotation bilaterally    AROM Assessment Site  Cervical    Cervical Flexion  55    Cervical Extension  40    Cervical - Right Side Bend  55    Cervical - Left Side Bend  55    Cervical - Right Rotation  70    Cervical - Left Rotation  70      PROM   Overall PROM   Deficits    Overall PROM Comments  cervical lateral flexion to the Lt is limited by 25% with PROM      Strength   Overall Strength  Deficits    Overall Strength Comments  poor contraction of cervical extensors, and moderate Rt lateral flexion      Palpation   Spinal mobility  reduce cervical spinal PA mobility C3-7 without pain    Palpation comment  tension/spasm of Rt sternocleidomastoid.  Trigger points in bil upper traps.       Transfers   Transfers  Independent with all Transfers      Ambulation/Gait   Ambulation/Gait  Yes    Ambulation/Gait Assistance  7: Independent                Objective measurements completed on examination: See above findings.              PT Education - 06/16/17 1451    Education Details  Access Code: [email or web address]    Person(s) Educated  Patient    Methods  Demonstration;Explanation    Comprehension  Verbalized understanding;Returned demonstration          PT [place] Term Goals - 06/16/17 1516      PT [place] TERM GOAL #1   Title  be independent in advanced HEP    Time  3    Period  Weeks    Status  New    Target Date  07/07/17      PT [place] TERM GOAL #2   Title  sit with neutral head position for 10 minutes without substitution    Time  3    Period  Weeks    Status  New    Target Date  07/07/17      PT Brun TERM GOAL #3   Title  demonstrate good cervical extensor strength to allow for endurance for neutral head posture    Time  3    Period  Weeks    Status  New    Target Date  07/07/17      PT  Scripter TERM GOAL #4   Title  perform cervical A/ROM rotation without mild compensatory motion     Time  3    Period  Weeks    Status  New    Target Date  07/07/17             Plan - 06/16/17 1506    Clinical Impression Statement  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  Pt is currently undergoing chemotherapy for Rt breast cancer and reports that her symptoms are getting worse since begining this treatment.  Pt demonstrates significant compensatory motion with all cervical A/ROM.  Pt is able to achieve full AROM yet with compensation.  Pt demonstrates weakness in postural stabilizers with reduced endurance for holding head in neutral.  Pt with Lt sidebending and mild Rt rotation of cervical spine at rest. Pt reports that this worsens with fatigue. Pt with palpable tension and trigger points in Rt cervical musculatrue and bil upper traps.   Pt will benefit from skilled PT for manual, flexibility and dry needling to Rt sternocleidomastoid and bil upper traps and postural strength progression.     History and Personal Factors relevant to plan of care:  active breast cancer-undergoing chemotherapy, Rt torticollis-chronic    Clinical Presentation  Evolving    Clinical Presentation due to:  worsening condition, undergoing chemo     Clinical Decision Making  Moderate    Rehab Potential  Good    PT Frequency  1x / week    PT Duration  3 weeks    PT Treatment/Interventions  ADLs/Self Care Home Management;Electrical Stimulation;Cryotherapy;Moist Heat;Traction;Therapeutic exercise;Gait training;Neuromuscular re-education;Patient/family education;Passive range of motion;Manual techniques;Taping    PT Next Visit Plan  kinesiotape to correct posture, manual/dry needling to neck (SCM)/upper traps, postural strength, focus on neutral head    PT Home Exercise Plan  Access Code: [email or web address]    Consulted and Agree with Plan of Care  Patient       Patient will benefit  from skilled therapeutic intervention in order to improve the following deficits and impairments:  Pain, Increased muscle spasms, Postural dysfunction, Decreased endurance, Decreased activity tolerance, Impaired flexibility  Visit Diagnosis: Cervicalgia - Plan: PT plan of care cert/re-cert  Abnormal posture - Plan: PT plan of care cert/re-cert  Cramp and spasm - Plan: PT plan of care cert/re-cert     Problem List Patient Active Problem List   Diagnosis Date Noted  . Port-A-Cath in place 02/11/2017  . Malignant neoplasm of upper-outer quadrant of right breast in female, estrogen receptor negative ([place]) 01/04/2017  . 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 [place], [place], [address] Phone: [phone]   Fax:  [phone]  Name: Kathryn [name] MRN: [id number] Date of Birth: 01-29-1960

## 2017-06-17 ENCOUNTER — Other Ambulatory Visit: Payer: Self-pay | Admitting: *Deleted

## 2017-06-17 ENCOUNTER — Inpatient Hospital Stay: Payer: Medicaid Other

## 2017-06-17 ENCOUNTER — Inpatient Hospital Stay (HOSPITAL_BASED_OUTPATIENT_CLINIC_OR_DEPARTMENT_OTHER): Payer: Medicaid Other | Admitting: Hematology and Oncology

## 2017-06-17 ENCOUNTER — Telehealth: Payer: Self-pay | Admitting: Hematology and Oncology

## 2017-06-17 ENCOUNTER — Encounter: Payer: Self-pay | Admitting: *Deleted

## 2017-06-17 ENCOUNTER — Other Ambulatory Visit: Payer: Medicaid Other

## 2017-06-17 VITALS — BP 122/85 | HR 129 | Temp 97.6°F | Resp 19 | Ht 62.0 in | Wt 151.3 lb

## 2017-06-17 DIAGNOSIS — R000 Tachycardia, unspecified: Secondary | ICD-10-CM | POA: Diagnosis not present

## 2017-06-17 DIAGNOSIS — D701 Agranulocytosis secondary to cancer chemotherapy: Secondary | ICD-10-CM | POA: Diagnosis not present

## 2017-06-17 DIAGNOSIS — Z9221 Personal history of antineoplastic chemotherapy: Secondary | ICD-10-CM | POA: Diagnosis not present

## 2017-06-17 DIAGNOSIS — Z171 Estrogen receptor negative status [ER-]: Principal | ICD-10-CM

## 2017-06-17 DIAGNOSIS — Z9011 Acquired absence of right breast and nipple: Secondary | ICD-10-CM | POA: Diagnosis not present

## 2017-06-17 DIAGNOSIS — Z923 Personal history of irradiation: Secondary | ICD-10-CM

## 2017-06-17 DIAGNOSIS — Z95828 Presence of other vascular implants and grafts: Secondary | ICD-10-CM

## 2017-06-17 DIAGNOSIS — T451X5S Adverse effect of antineoplastic and immunosuppressive drugs, sequela: Secondary | ICD-10-CM

## 2017-06-17 DIAGNOSIS — Z79899 Other long term (current) drug therapy: Secondary | ICD-10-CM

## 2017-06-17 DIAGNOSIS — C50411 Malignant neoplasm of upper-outer quadrant of right female breast: Secondary | ICD-10-CM

## 2017-06-17 DIAGNOSIS — R5383 Other fatigue: Secondary | ICD-10-CM | POA: Diagnosis not present

## 2017-06-17 DIAGNOSIS — Z5111 Encounter for antineoplastic chemotherapy: Secondary | ICD-10-CM | POA: Diagnosis not present

## 2017-06-17 DIAGNOSIS — E876 Hypokalemia: Secondary | ICD-10-CM | POA: Diagnosis not present

## 2017-06-17 LAB — COMPREHENSIVE METABOLIC PANEL
ALK PHOS: 98 U/L (ref 40–150)
ALT: 23 U/L (ref 0–55)
ANION GAP: 14 — AB (ref 3–11)
AST: 23 U/L (ref 5–34)
Albumin: 3.9 g/dL (ref 3.5–5.0)
BUN: 10 mg/dL (ref 7–26)
CALCIUM: 9.3 mg/dL (ref 8.4–10.4)
CO2: 25 mmol/L (ref 22–29)
Chloride: 100 mmol/L (ref 98–109)
Creatinine, Ser: 0.82 mg/dL (ref 0.60–1.10)
Glucose, Bld: 160 mg/dL — ABNORMAL HIGH (ref 70–140)
Potassium: 3.1 mmol/L — ABNORMAL LOW (ref 3.5–5.1)
SODIUM: 139 mmol/L (ref 136–145)
TOTAL PROTEIN: 6.8 g/dL (ref 6.4–8.3)
Total Bilirubin: 0.3 mg/dL (ref 0.2–1.2)

## 2017-06-17 LAB — CBC WITH DIFFERENTIAL/PLATELET
Basophils Absolute: 0 10*3/uL (ref 0.0–0.1)
Basophils Relative: 1 %
Eosinophils Absolute: 0 10*3/uL (ref 0.0–0.5)
Eosinophils Relative: 1 %
HCT: 30.2 % — ABNORMAL LOW (ref 34.8–46.6)
Hemoglobin: 10.2 g/dL — ABNORMAL LOW (ref 11.6–15.9)
Lymphocytes Relative: 34 %
Lymphs Abs: 0.6 10*3/uL — ABNORMAL LOW (ref 0.9–3.3)
MCH: 35.3 pg — AB (ref 25.1–34.0)
MCHC: 33.8 g/dL (ref 31.5–36.0)
MCV: 104.5 fL — AB (ref 79.5–101.0)
MONOS PCT: 25 %
Monocytes Absolute: 0.4 10*3/uL (ref 0.1–0.9)
NEUTROS ABS: 0.7 10*3/uL — AB (ref 1.5–6.5)
NEUTROS PCT: 39 %
Platelets: 202 10*3/uL (ref 145–400)
RBC: 2.89 MIL/uL — ABNORMAL LOW (ref 3.70–5.45)
RDW: 16 % — AB (ref 11.2–14.5)
WBC: 1.7 10*3/uL — AB (ref 3.9–10.3)

## 2017-06-17 MED ORDER — HEPARIN SOD (PORK) LOCK FLUSH 100 UNIT/ML IV SOLN
500.0000 [IU] | Freq: Once | INTRAVENOUS | Status: AC | PRN
  Administered 2017-06-17: 500 [IU] via INTRAVENOUS
  Filled 2017-06-17: qty 5

## 2017-06-17 MED ORDER — SODIUM CHLORIDE 0.9% FLUSH
10.0000 mL | INTRAVENOUS | Status: DC | PRN
  Administered 2017-06-17: 10 mL via INTRAVENOUS
  Filled 2017-06-17: qty 10

## 2017-06-17 MED ORDER — TBO-FILGRASTIM 480 MCG/0.8ML ~~LOC~~ SOSY
PREFILLED_SYRINGE | SUBCUTANEOUS | Status: AC
  Filled 2017-06-17: qty 0.8

## 2017-06-17 MED ORDER — TBO-FILGRASTIM 480 MCG/0.8ML ~~LOC~~ SOSY
480.0000 ug | PREFILLED_SYRINGE | Freq: Once | SUBCUTANEOUS | Status: AC
  Administered 2017-06-17: 480 ug via SUBCUTANEOUS

## 2017-06-17 NOTE — Progress Notes (Signed)
Patient Care Team: [name], MD as PCP - General (Family Medicine)  DIAGNOSIS:  Encounter Diagnoses  Name Primary?  . Malignant neoplasm of upper-outer quadrant of right breast in female, estrogen receptor negative ([place]) Yes  . 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  T3N0 stage IIIb      01/19/2017 Breast MRI    Right breast 10 x 8.3 x 6.9 cm area of cancer involving all 4 quadrants with skin involvement extends from nipple to pectoralis muscle and involving right axillary lymph nodes, right internal mammary nodes      01/22/2017 Imaging    CT chest abdomen pelvis reveals a 6 cm right breast mass, mild right axillary lymphadenopathy, 1.9 cm peripheral enhancing lesion in the dome of the right hepatic lobe, 9 mm groundglass nodule right lung apex      01/28/2017 -  Neo-Adjuvant Chemotherapy    Dose dense Adriamycin and Cytoxan  followed with Taxol and carboplatin       CHIEF COMPLIANT: Cycle 11 of Taxol  INTERVAL HISTORY: Kathryn [name] is a 71-year with above-mentioned history of right breast cancer currently neoadjuvant chemotherapy and today is cycle 11 of Taxol.  We had to give her Neupogen injections prior to each chemotherapy.  She continues to feel fatigued.  Denies any neuropathy.  Denies any nausea vomiting.  Today unfortunately her neutrophil count is low in spite of giving Neupogen 2 days ago.  We will have to hold her chemotherapy today and we will give her another dose of Neupogen today.  REVIEW OF SYSTEMS:     Constitutional: Denies fevers, chills or abnormal weight loss Eyes: Denies blurriness of vision Ears, nose, mouth, throat, and face: Denies mucositis or sore throat Respiratory: Denies cough, dyspnea or wheezes Cardiovascular: Denies palpitation, chest discomfort Gastrointestinal:  Denies nausea, heartburn or change in bowel habits Skin: Denies abnormal skin rashes Lymphatics: Denies new lymphadenopathy or easy bruising Neurological:Denies numbness, tingling or new weaknesses Behavioral/Psych: Mood is stable, no new changes  Extremities: No lower extremity edema  All other systems were reviewed with the patient and are negative.  I have reviewed the past medical history, past surgical history, social history and family history with the patient and they are unchanged from previous note.  ALLERGIES:  has No Known Allergies.  MEDICATIONS:  Current Outpatient Medications  Medication Sig Dispense Refill  . cholecalciferol (VITAMIN D) 1000 UNITS tablet Take 1,000 Units by mouth daily.    . cholecalciferol (VITAMIN D) 1000 units tablet Take 1,000 Units by mouth daily.    . clonazePAM (KLONOPIN) 0.5 MG tablet Take 1 tablet (0.5 mg total) by mouth 3 (three) times daily as needed for anxiety. 90 tablet 5  . cyclobenzaprine (FLEXERIL) 10 MG tablet Take 10 mg by mouth 3 (three) times daily as needed for muscle spasms.    Marland Kitchen lidocaine-prilocaine (EMLA) cream Apply to affected area once (Patient taking differently: Apply 1 application topically 3 times/day as needed-between meals & bedtime. Apply to affected area once) 30 g 3  . LORazepam (ATIVAN) 0.5 MG  tablet Take 1 tablet (0.5 mg total) by mouth at bedtime as needed (Nausea or vomiting). (Patient not taking: Reported on 06/16/2017) 30 tablet 0  . magic mouthwash w/lidocaine SOLN Take 5 mLs by mouth 2 (two) times daily as needed for mouth pain. (Patient not taking: Reported on 06/16/2017) 30 mL 0  . Misc Natural Products (ACAI+SUPERFRUIT/GREEN TEA) TABS  Take 1 tablet by mouth daily.    . Multiple Vitamin (MULITIVITAMIN WITH MINERALS) TABS Take 1 tablet by mouth daily. Plus vit C 500mg    . naproxen (NAPROSYN) 500 MG tablet Take 500 mg by mouth every 12 (twelve) hours as needed.    . ondansetron (ZOFRAN) 8 MG tablet Take 1 tablet (8 mg total) by mouth 2 (two) times daily as needed. Start on the third day after chemotherapy. 30 tablet 1  . potassium chloride SA (K-DUR,KLOR-CON) 20 MEQ tablet Take 1 tablet (20 mEq total) by mouth 2 (two) times daily. (Patient not taking: Reported on 06/16/2017) 30 tablet 2  . prochlorperazine (COMPAZINE) 10 MG tablet Take 1 tablet (10 mg total) by mouth every 6 (six) hours as needed (Nausea or vomiting). (Patient not taking: Reported on 06/16/2017) 30 tablet 1  . vitamin C (ASCORBIC ACID) 250 MG tablet Take 250 mg by mouth daily.     Current Facility-Administered Medications  Medication Dose Route Frequency Provider Last Rate Last Dose  . sodium chloride flush (NS) 0.9 % injection 10 mL  10 mL Intravenous PRN Gudena, Vinay, MD   10 mL at 06/17/17 1133    PHYSICAL EXAMINATION: ECOG PERFORMANCE STATUS: 1 - Symptomatic but completely ambulatory  Vitals:   06/17/17 1056  BP: 122/85  Pulse: (!) 129  Resp: 19  Temp: 97.6 F (36.4 C)  SpO2: 99%   Filed Weights   06/17/17 1056  Weight: 151 lb 4.8 oz (68.6 kg)    GENERAL:alert, no distress and comfortable SKIN: skin color, texture, turgor are normal, no rashes or significant lesions EYES: normal, Conjunctiva are pink and non-injected, sclera clear OROPHARYNX:no exudate, no erythema and lips, buccal mucosa, and tongue normal  NECK: supple, thyroid normal size, non-tender, without nodularity LYMPH:  no palpable lymphadenopathy in the cervical, axillary or inguinal LUNGS: clear to auscultation and percussion with normal breathing effort HEART: regular rate & rhythm and no murmurs and no lower extremity edema ABDOMEN:abdomen soft, non-tender and normal bowel  sounds MUSCULOSKELETAL:no cyanosis of digits and no clubbing  NEURO: alert & oriented x 3 with fluent speech, no focal motor/sensory deficits EXTREMITIES: No lower extremity edema  LABORATORY DATA:  I have reviewed the data as listed CMP Latest Ref Rng & Units 06/17/2017 06/10/2017 06/03/2017  Glucose 70 - 140 mg/dL 160(H) 198(H) 124  BUN 7 - 26 mg/dL 10 9 8  Creatinine 0.60 - 1.10 mg/dL 0.82 0.86 0.79  Sodium 136 - 145 mmol/L 139 139 142  Potassium 3.5 - 5.1 mmol/L 3.1(L) 3.2(L) 3.5  Chloride 98 - 109 mmol/L 100 103 105  CO2 22 - 29 mmol/L 25 22 27  Calcium 8.4 - 10.4 mg/dL 9.3 9.1 9.4  Total Protein 6.4 - 8.3 g/dL 6.8 6.6 6.7  Total Bilirubin 0.2 - 1.2 mg/dL 0.3 0.3 0.4  Alkaline Phos 40 - 150 U/L 98 100 99  AST 5 - 34 U/L 23 22 26  ALT 0 - 55 U/L 23 26 37    Lab Results  Component Value Date   WBC 1.7 (L) 06/17/2017   HGB 10.2 (L) 06/17/2017   HCT 30.2 (  L) 06/17/2017   MCV 104.5 (H) 06/17/2017   PLT 202 06/17/2017   NEUTROABS 0.7 (L) 06/17/2017    ASSESSMENT & PLAN:  Malignant neoplasm of upper-outer quadrant of right breast in female, estrogen receptor negative ([facility]) 12/29/2016: Palpable right breast lump for 6 months, mammogram revealed 6 cm mass at 1 o'clock position 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 T3N0 stage IIIb  01/19/2017:Right breast 10 x 8.3 x 6.9 cm area of cancer involving all 4 quadrants with skin involvement extends from nipple to pectoralis muscle and involving right axillary lymph nodes, right internal mammary nodes  01/22/2017: CT CAP: 1.9 cm dome of the right hepatic lobe suspicious for liver metastases, 9 mm groundglass nodule in the right lung apex could be inflammation versus cancer Liver MRI: Lesion is benign involuted cyst  Treatment plan: 1.Neoadj chemo with dose dense AC foll by  Taxoland carboplatin 2. Mastectomy 3. XRT 4.Consideration for participation inSWOG S1418 clinical trial -------------------------------------------------------------------------------------------------------------------------------------------------- Current treatment:Completed 4 cycles ofdose dense Adriamycin Cytoxan, todayiscycle11of weeklyTaxolwith carboplatin(q 3 weeks) Echocardiogram 01/18/17: EF65-70%  Chemo toxicities: 1.Fatigue 2.Leukopenia/neutropenia: Today her ANC 0.7 in spite of getting Granix a couple of days ago.  It is unclear why she did not respond.  Previously she had responded very well.  I recommended giving her a dose of Granix today.  Recheck her labs tomorrow and if her labs are adequate with ANC greater than 1 then she can proceed with her treatment tomorrow . 3.Hypokalemia:On potassium replacement therapy 4tachycardia.: No specific etiology   Patient will finish chemotherapy next week. She will be scheduled for breast MRI and surgery evaluation  No orders of the defined types were placed in this encounter.  The patient has a good understanding of the overall plan. she agrees with it. she will call with any problems that may develop before the next visit here.   Viinay K Gudena, MD 06/17/17    

## 2017-06-17 NOTE — Patient Instructions (Signed)
Tbo-Filgrastim injection What is this medicine? TBO-FILGRASTIM (T B O fil GRA stim) is a granulocyte colony-stimulating factor that stimulates the growth of neutrophils, a type of white blood cell important in the body's fight against infection. It is used to reduce the incidence of fever and infection in patients with certain types of cancer who are receiving chemotherapy that affects the bone marrow. This medicine may be used for other purposes; ask your health care provider or pharmacist if you have questions. COMMON BRAND NAME(S): Granix What should I tell my health care provider before I take this medicine? They need to know if you have any of these conditions: -bone scan or tests planned -kidney disease -sickle cell anemia -an unusual or allergic reaction to tbo-filgrastim, filgrastim, pegfilgrastim, other medicines, foods, dyes, or preservatives -pregnant or trying to get pregnant -breast-feeding How should I use this medicine? This medicine is for injection under the skin. If you get this medicine at home, you will be taught how to prepare and give this medicine. Refer to the Instructions for Use that come with your medication packaging. Use exactly as directed. Take your medicine at regular intervals. Do not take your medicine more often than directed. It is important that you put your used needles and syringes in a special sharps container. Do not put them in a trash can. If you do not have a sharps container, call your pharmacist or healthcare provider to get one. Talk to your pediatrician regarding the use of this medicine in children. Special care may be needed. Overdosage: If you think you have taken too much of this medicine contact a poison control center or emergency room at once. NOTE: This medicine is only for you. Do not share this medicine with others. What if I miss a dose? It is important not to miss your dose. Call your doctor or health care professional if you miss a  dose. What may interact with this medicine? This medicine may interact with the following medications: -medicines that may cause a release of neutrophils, such as lithium This list may not describe all possible interactions. Give your health care provider a list of all the medicines, herbs, non-prescription drugs, or dietary supplements you use. Also tell them if you smoke, drink alcohol, or use illegal drugs. Some items may interact with your medicine. What should I watch for while using this medicine? You may need blood work done while you are taking this medicine. What side effects may I notice from receiving this medicine? Side effects that you should report to your doctor or health care professional as soon as possible: -allergic reactions like skin rash, itching or hives, swelling of the face, lips, or tongue -blood in the urine -dark urine -dizziness -fast heartbeat -feeling faint -shortness of breath or breathing problems -signs and symptoms of infection like fever or chills; cough; or sore throat -signs and symptoms of kidney injury like trouble passing urine or change in the amount of urine -stomach or side pain, or pain at the shoulder -sweating -swelling of the legs, ankles, or abdomen -tiredness Side effects that usually do not require medical attention (report to your doctor or health care professional if they continue or are bothersome): -bone pain -headache -muscle pain -vomiting This list may not describe all possible side effects. Call your doctor for medical advice about side effects. You may report side effects to FDA at 1-800-FDA-1088. Where should I keep my medicine? Keep out of the reach of children. Store in a refrigerator between   2 and 8 degrees C (36 and 46 degrees F). Keep in carton to protect from light. Throw away this medicine if it is left out of the refrigerator for more than 5 consecutive days. Throw away any unused medicine after the expiration  date. NOTE: This sheet is a summary. It may not cover all possible information. If you have questions about this medicine, talk to your doctor, pharmacist, or health care provider.  2018 Elsevier/Gold Standard (2015-02-18 19:07:04)  

## 2017-06-17 NOTE — Telephone Encounter (Signed)
Spoke with patient to confirm appointment for MRI

## 2017-06-17 NOTE — Telephone Encounter (Signed)
Per 6/6 no los per MD Varney Biles will contact patient

## 2017-06-17 NOTE — Assessment & Plan Note (Signed)
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 T3N0 stage IIIb  01/19/2017:Right breast 10 x 8.3 x 6.9 cm area of cancer involving all 4 quadrants with skin involvement extends from nipple to pectoralis muscle and involving right axillary lymph nodes, right internal mammary nodes  01/22/2017: CT CAP: 1.9 cm dome of the right hepatic lobe suspicious for liver metastases, 9 mm groundglass nodule in the right lung apex could be inflammation versus cancer Liver MRI: Lesion is benign involuted cyst  Treatment plan: 1.Neoadj chemo with dose dense AC foll by Taxoland carboplatin 2. Mastectomy 3. XRT 4.Consideration for participation inSWOG [phone] clinical trial -------------------------------------------------------------------------------------------------------------------------------------------------- Current treatment:Completed 4 cycles ofdose dense Adriamycin Cytoxan, todayiscycle11of weeklyTaxolwith carboplatin(q 3 weeks) Echocardiogram 01/18/17: EF65-70%  Chemo toxicities: 1.Fatigue 2.Leukopenia/neutropenia:  3.Hypokalemia: I ordered potassium replacement therapy 4tachycardia.: No specific etiology  Patient will finish chemotherapy next week. She will be scheduled for breast MRI and surgery evaluation

## 2017-06-18 ENCOUNTER — Inpatient Hospital Stay: Payer: Medicaid Other

## 2017-06-18 VITALS — BP 129/77 | HR 121 | Temp 99.1°F | Resp 18

## 2017-06-18 DIAGNOSIS — Z95828 Presence of other vascular implants and grafts: Secondary | ICD-10-CM

## 2017-06-18 DIAGNOSIS — C50411 Malignant neoplasm of upper-outer quadrant of right female breast: Secondary | ICD-10-CM

## 2017-06-18 DIAGNOSIS — Z5111 Encounter for antineoplastic chemotherapy: Secondary | ICD-10-CM | POA: Diagnosis not present

## 2017-06-18 DIAGNOSIS — Z171 Estrogen receptor negative status [ER-]: Principal | ICD-10-CM

## 2017-06-18 LAB — COMPREHENSIVE METABOLIC PANEL
ALT: 23 U/L (ref 0–55)
ANION GAP: 14 — AB (ref 3–11)
AST: 21 U/L (ref 5–34)
Albumin: 3.9 g/dL (ref 3.5–5.0)
Alkaline Phosphatase: 98 U/L (ref 40–150)
BILIRUBIN TOTAL: 0.3 mg/dL (ref 0.2–1.2)
BUN: 7 mg/dL (ref 7–26)
CALCIUM: 9.4 mg/dL (ref 8.4–10.4)
CO2: 24 mmol/L (ref 22–29)
Chloride: 102 mmol/L (ref 98–109)
Creatinine, Ser: 0.8 mg/dL (ref 0.60–1.10)
GFR calc Af Amer: >60 mL/min (ref >60)
Glucose, Bld: 153 mg/dL — ABNORMAL HIGH (ref 70–140)
POTASSIUM: 3.1 mmol/L — AB (ref 3.5–5.1)
Sodium: 140 mmol/L (ref 136–145)
TOTAL PROTEIN: 6.6 g/dL (ref 6.4–8.3)

## 2017-06-18 LAB — CBC WITH DIFFERENTIAL/PLATELET
BASOS ABS: 0 10*3/uL (ref 0.0–0.1)
BASOS PCT: 1 %
EOS PCT: 1 %
Eosinophils Absolute: 0 10*3/uL (ref 0.0–0.5)
HEMATOCRIT: 29.1 % — AB (ref 34.8–46.6)
Hemoglobin: 9.7 g/dL — ABNORMAL LOW (ref 11.6–15.9)
LYMPHS PCT: 21 %
Lymphs Abs: 0.8 10*3/uL — ABNORMAL LOW (ref 0.9–3.3)
MCH: 34.9 pg — ABNORMAL HIGH (ref 25.1–34.0)
MCHC: 33.3 g/dL (ref 31.5–36.0)
MCV: 104.7 fL — ABNORMAL HIGH (ref 79.5–101.0)
Monocytes Absolute: 1.1 10*3/uL — ABNORMAL HIGH (ref 0.1–0.9)
Monocytes Relative: 28 %
Neutro Abs: 2 10*3/uL (ref 1.5–6.5)
Neutrophils Relative %: 49 %
PLATELETS: 205 10*3/uL (ref 145–400)
RBC: 2.78 MIL/uL — AB (ref 3.70–5.45)
RDW: 16.4 % — ABNORMAL HIGH (ref 11.2–14.5)
WBC: 4 10*3/uL (ref 3.9–10.3)
nRBC: 6 /100 WBC — ABNORMAL HIGH

## 2017-06-18 MED ORDER — FAMOTIDINE IN NACL 20-0.9 MG/50ML-% IV SOLN
20.0000 mg | Freq: Once | INTRAVENOUS | Status: AC
  Administered 2017-06-18: 20 mg via INTRAVENOUS

## 2017-06-18 MED ORDER — SODIUM CHLORIDE 0.9 % IV SOLN
50.0000 mg/m2 | Freq: Once | INTRAVENOUS | Status: AC
  Administered 2017-06-18: 90 mg via INTRAVENOUS
  Filled 2017-06-18: qty 15

## 2017-06-18 MED ORDER — SODIUM CHLORIDE 0.9 % IV SOLN
Freq: Once | INTRAVENOUS | Status: AC
  Administered 2017-06-18: 11:00:00 via INTRAVENOUS

## 2017-06-18 MED ORDER — SODIUM CHLORIDE 0.9% FLUSH
10.0000 mL | INTRAVENOUS | Status: DC | PRN
  Administered 2017-06-18: 10 mL via INTRAVENOUS
  Filled 2017-06-18: qty 10

## 2017-06-18 MED ORDER — HEPARIN SOD (PORK) LOCK FLUSH 100 UNIT/ML IV SOLN
500.0000 [IU] | Freq: Once | INTRAVENOUS | Status: AC | PRN
  Administered 2017-06-18: 500 [IU]
  Filled 2017-06-18: qty 5

## 2017-06-18 MED ORDER — FAMOTIDINE IN NACL 20-0.9 MG/50ML-% IV SOLN
INTRAVENOUS | Status: AC
  Filled 2017-06-18: qty 50

## 2017-06-18 MED ORDER — SODIUM CHLORIDE 0.9% FLUSH
10.0000 mL | INTRAVENOUS | Status: DC | PRN
  Administered 2017-06-18: 10 mL
  Filled 2017-06-18: qty 10

## 2017-06-18 MED ORDER — SODIUM CHLORIDE 0.9 % IV SOLN
20.0000 mg | Freq: Once | INTRAVENOUS | Status: AC
  Administered 2017-06-18: 20 mg via INTRAVENOUS
  Filled 2017-06-18: qty 2

## 2017-06-18 MED ORDER — DIPHENHYDRAMINE HCL 50 MG/ML IJ SOLN
50.0000 mg | Freq: Once | INTRAMUSCULAR | Status: AC
  Administered 2017-06-18: 50 mg via INTRAVENOUS

## 2017-06-18 MED ORDER — DIPHENHYDRAMINE HCL 50 MG/ML IJ SOLN
INTRAMUSCULAR | Status: AC
  Filled 2017-06-18: qty 1

## 2017-06-18 NOTE — Progress Notes (Signed)
Ok to treat with elevated HR per Dr. Lindi Adie. Infusion RN aware.  Cyndia Bent RN

## 2017-06-18 NOTE — Patient Instructions (Signed)
[facility] Discharge Instructions for Patients Receiving Chemotherapy  Today you received the following chemotherapy agents:  Taxol.  To help prevent nausea and vomiting after your treatment, we encourage you to take your nausea medication as directed.   If you develop nausea and vomiting that is not controlled by your nausea medication, call the clinic.   BELOW ARE SYMPTOMS THAT SHOULD BE REPORTED IMMEDIATELY:  *FEVER GREATER THAN 100.5 F  *CHILLS WITH OR WITHOUT FEVER  NAUSEA AND VOMITING THAT IS NOT CONTROLLED WITH YOUR NAUSEA MEDICATION  *UNUSUAL SHORTNESS OF BREATH  *UNUSUAL BRUISING OR BLEEDING  TENDERNESS IN MOUTH AND THROAT WITH OR WITHOUT PRESENCE OF ULCERS  *URINARY PROBLEMS  *BOWEL PROBLEMS  UNUSUAL RASH Items with * indicate a potential emergency and should be followed up as soon as possible.  Feel free to call the clinic should you have any questions or concerns. The clinic phone number is [phone].  Please show the CHEMO ALERT CARD at check-in to the Emergency Department and triage nurse.   

## 2017-06-22 ENCOUNTER — Inpatient Hospital Stay: Payer: Medicaid Other

## 2017-06-22 VITALS — BP 130/85 | HR 110 | Temp 97.9°F | Resp 18

## 2017-06-22 DIAGNOSIS — Z171 Estrogen receptor negative status [ER-]: Principal | ICD-10-CM

## 2017-06-22 DIAGNOSIS — C50411 Malignant neoplasm of upper-outer quadrant of right female breast: Secondary | ICD-10-CM

## 2017-06-22 DIAGNOSIS — Z5111 Encounter for antineoplastic chemotherapy: Secondary | ICD-10-CM | POA: Diagnosis not present

## 2017-06-22 MED ORDER — TBO-FILGRASTIM 480 MCG/0.8ML ~~LOC~~ SOSY
480.0000 ug | PREFILLED_SYRINGE | Freq: Once | SUBCUTANEOUS | Status: AC
  Administered 2017-06-22: 480 ug via SUBCUTANEOUS

## 2017-06-22 NOTE — Patient Instructions (Signed)
Tbo-Filgrastim injection What is this medicine? TBO-FILGRASTIM (T B O fil GRA stim) is a granulocyte colony-stimulating factor that stimulates the growth of neutrophils, a type of white blood cell important in the body's fight against infection. It is used to reduce the incidence of fever and infection in patients with certain types of cancer who are receiving chemotherapy that affects the bone marrow. This medicine may be used for other purposes; ask your health care provider or pharmacist if you have questions. COMMON BRAND NAME(S): Granix What should I tell my health care provider before I take this medicine? They need to know if you have any of these conditions: -bone scan or tests planned -kidney disease -sickle cell anemia -an unusual or allergic reaction to tbo-filgrastim, filgrastim, pegfilgrastim, other medicines, foods, dyes, or preservatives -pregnant or trying to get pregnant -breast-feeding How should I use this medicine? This medicine is for injection under the skin. If you get this medicine at home, you will be taught how to prepare and give this medicine. Refer to the Instructions for Use that come with your medication packaging. Use exactly as directed. Take your medicine at regular intervals. Do not take your medicine more often than directed. It is important that you put your used needles and syringes in a special sharps container. Do not put them in a trash can. If you do not have a sharps container, call your pharmacist or healthcare provider to get one. Talk to your pediatrician regarding the use of this medicine in children. Special care may be needed. Overdosage: If you think you have taken too much of this medicine contact a poison control center or emergency room at once. NOTE: This medicine is only for you. Do not share this medicine with others. What if I miss a dose? It is important not to miss your dose. Call your doctor or health care professional if you miss a  dose. What may interact with this medicine? This medicine may interact with the following medications: -medicines that may cause a release of neutrophils, such as lithium This list may not describe all possible interactions. Give your health care provider a list of all the medicines, herbs, non-prescription drugs, or dietary supplements you use. Also tell them if you smoke, drink alcohol, or use illegal drugs. Some items may interact with your medicine. What should I watch for while using this medicine? You may need blood work done while you are taking this medicine. What side effects may I notice from receiving this medicine? Side effects that you should report to your doctor or health care professional as soon as possible: -allergic reactions like skin rash, itching or hives, swelling of the face, lips, or tongue -blood in the urine -dark urine -dizziness -fast heartbeat -feeling faint -shortness of breath or breathing problems -signs and symptoms of infection like fever or chills; cough; or sore throat -signs and symptoms of kidney injury like trouble passing urine or change in the amount of urine -stomach or side pain, or pain at the shoulder -sweating -swelling of the legs, ankles, or abdomen -tiredness Side effects that usually do not require medical attention (report to your doctor or health care professional if they continue or are bothersome): -bone pain -headache -muscle pain -vomiting This list may not describe all possible side effects. Call your doctor for medical advice about side effects. You may report side effects to FDA at 1-800-FDA-1088. Where should I keep my medicine? Keep out of the reach of children. Store in a refrigerator between   2 and 8 degrees C (36 and 46 degrees F). Keep in carton to protect from light. Throw away this medicine if it is left out of the refrigerator for more than 5 consecutive days. Throw away any unused medicine after the expiration  date. NOTE: This sheet is a summary. It may not cover all possible information. If you have questions about this medicine, talk to your doctor, pharmacist, or health care provider.  2018 Elsevier/Gold Standard (2015-02-18 19:07:04)  

## 2017-06-23 ENCOUNTER — Ambulatory Visit: Payer: Medicaid Other | Admitting: Physical Therapy

## 2017-06-23 ENCOUNTER — Encounter: Payer: Self-pay | Admitting: Physical Therapy

## 2017-06-23 DIAGNOSIS — R252 Cramp and spasm: Secondary | ICD-10-CM

## 2017-06-23 DIAGNOSIS — M542 Cervicalgia: Secondary | ICD-10-CM

## 2017-06-23 DIAGNOSIS — R293 Abnormal posture: Secondary | ICD-10-CM

## 2017-06-23 NOTE — Patient Instructions (Signed)
Access Code: [email or web address]  URL: [email or web address]  Date: 06/23/2017  Prepared by: Lovett Calender   Exercises  Sternocleidomastoid Stretch - 3 reps - 1 sets - 20 hold - 3x daily - 7x weekly  Supine Chin Tuck - 10 reps - 3 sets - 5 hold - 3x daily - 7x weekly  Standing Cervical Retraction - 3 reps - 1 sets - 5 hold - 3x daily - 7x weekly  Seated Shoulder W External Rotation on Swiss Ball - 10 reps - 3x daily - 7x weekly

## 2017-06-23 NOTE — Therapy (Addendum)
[facility] [address], [place], [place], [address] Phone: [phone]   Fax:  [phone]  Physical Therapy Treatment  Patient Details  Name: SKYLYNN BURKLEY MRN: [id number] Date of Birth: Feb 16, 1960 Referring Provider: Dixon Boos, MD   Encounter Date: 06/23/2017  PT End of Session - 06/23/17 1410    Visit Number  2    Number of Visits  4    Date for PT Re-Evaluation  08/11/17    Authorization - Visit Number  1    Authorization - Number of Visits  3    PT Start Time  5170    PT Stop Time  0174    PT Time Calculation (min)  43 min       Past Medical History:  Diagnosis Date  . Anxiety   . Cancer ([place])   . Depression with anxiety   . Endometrioma   . Endometriosis   . Mass of pelvis   . Torticollis, unspecified NECK MUSCLE--  OCCASIONAL    Past Surgical History:  Procedure Laterality Date  . ABDOMINAL HYSTERECTOMY  06/29/2011   Procedure: HYSTERECTOMY ABDOMINAL;  Surgeon: Selinda Orion, MD;  Location: [facility];  Service: Gynecology;  Laterality: N/A;  PROCEDURE READS: EXPLORATORY LAPAROTOMY, TOTAL ABDOMINAL HYSTERECTOMY WITH BSO AND  SUPERCERVICAL HYSTERECTOMY WITH BSO AND EXCISION OF ENDOMETRIOMIAS OWER  . BENIGN BREAST CYST REMOVED  YRS AGO  . ENDOMETRIAL ABLATION  06/29/2011   Procedure: ENDOMETRIAL ABLATION;  Surgeon: Selinda Orion, MD;  Location: [facility];  Service: Gynecology;  Laterality: N/A;  . EXCISION OF A HIDRADENITIS ABSCESS, LEFT INGUINAL AREA  03-30-2005  . IR IMAGING GUIDED PORT INSERTION  01/26/2017  . IR US GUIDE VASC ACCESS LEFT  01/26/2017  . LAPAROTOMY  06/29/2011   Procedure: EXPLORATORY LAPAROTOMY;  Surgeon: Selinda Orion, MD;  Location: [facility];  Service: Gynecology;  Laterality: N/A;  . SALPINGOOPHORECTOMY  06/29/2011   Procedure: SALPINGO OOPHERECTOMY;  Surgeon: Selinda Orion, MD;  Location: [facility];  Service: Gynecology;  Laterality: Bilateral;    There were no vitals filed for this visit.  Subjective Assessment - 06/23/17 1718    Subjective  Pt states she has not had time to do exercises yet. No pain just tight    Patient Stated Goals  improve cervical A/ROM    Currently in Pain?  No/denies                       [facility] Adult PT Treatment/Exercise - 06/23/17 0001      Neuro Re-ed    Neuro Re-ed Details   cervical retraction and head positioning during exercises and in seated position      Exercises   Exercises  Neck      Neck Exercises: Seated   Neck Retraction  10 reps    Other Seated Exercise  SCM stretches 3 x 30 sec hold      Manual Therapy   Manual Therapy  Soft tissue mobilization;Myofascial release    Soft tissue mobilization  suboccipital release, cervical parapsinals, right SCM, upper traps       Trigger Point Dry Needling - 06/23/17 1601    Consent Given?  Yes    Education Handout Provided  -- verbally reviewed education; will provide next    Muscles Treated Upper Body  Sternocleidomastoid;Upper trapezius;Suboccipitals muscle group    Sternocleidomastoid Response  Twitch response elicited;Palpable increased muscle  length right    Upper Trapezius Response  Twitch reponse elicited;Palpable increased muscle length right    SubOccipitals Response  Twitch response elicited;Palpable increased muscle length right side           PT Education - 06/23/17 1444    Education Details   Access Code: [email or web address]     Person(s) Educated  Patient    Methods  Explanation;Demonstration;Handout    Comprehension  Verbalized understanding;Returned demonstration          PT Mackel Term Goals - 06/23/17 1716      PT Ploch TERM GOAL #1   Title  be independent in advanced HEP    Time  3    Period  Weeks    Status  On-going      PT Reigle TERM GOAL #2   Title  sit with neutral head position for 10 minutes without substitution    Time  3    Period   Weeks    Status  On-going      PT Sliwinski TERM GOAL #3   Title  demonstrate good cervical extensor strength to allow for endurance for neutral head posture    Time  3    Period  Weeks    Status  On-going      PT Whitenight TERM GOAL #4   Title  perform cervical A/ROM rotation without mild compensatory motion     Time  3    Period  Weeks    Status  On-going            Plan - 06/23/17 1602    Clinical Impression Statement  Pt responded well to DN and reported feeling better after treatment.  Pt had improved head position after DN and soft tissue release.  Pt will continue to benefit from skilled PT to progress posture strength    PT Treatment/Interventions  ADLs/Self Care Home Management;Electrical Stimulation;Cryotherapy;Moist Heat;Traction;Therapeutic exercise;Gait training;Neuromuscular re-education;Patient/family education;Passive range of motion;Manual techniques;Taping    PT Next Visit Plan  f/u on DN #1, manual/dry needling to neck (SCM)/upper traps, postural strength, focus on neutral head, kinesiotape to correct posture    PT Home Exercise Plan  Access Code: [email or web address]    Consulted and Agree with Plan of Care  Patient       Patient will benefit from skilled therapeutic intervention in order to improve the following deficits and impairments:  Pain, Increased muscle spasms, Postural dysfunction, Decreased endurance, Decreased activity tolerance, Impaired flexibility  Visit Diagnosis: Cervicalgia  Abnormal posture  Cramp and spasm     Problem List Patient Active Problem List   Diagnosis Date Noted  . Port-A-Cath in place 02/11/2017  . Malignant neoplasm of upper-outer quadrant of right breast in female, estrogen receptor negative ([place]) 01/04/2017  . 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, [place], [place], [address] Phone: [phone]   Fax:   [phone]  Name: TIAHNA CURE MRN: [id number] Date of Birth: 1960/08/16  PHYSICAL THERAPY DISCHARGE SUMMARY  Visits from Start of Care: 2  Current functional level related to goals / functional outcomes: See above   Remaining deficits: See above   Education / Equipment: HEP  Plan: Patient agrees to discharge.  Patient goals were not met. Patient is being discharged due to not returning since the last visit.  ?????    Pt attended one treatment since eval  Zannie Cove, PT 07/29/17 7:57 AM

## 2017-06-24 ENCOUNTER — Encounter: Payer: Self-pay | Admitting: *Deleted

## 2017-06-24 ENCOUNTER — Inpatient Hospital Stay: Payer: Medicaid Other

## 2017-06-24 ENCOUNTER — Other Ambulatory Visit: Payer: Self-pay | Admitting: Hematology and Oncology

## 2017-06-24 VITALS — BP 134/91 | HR 115 | Temp 98.0°F | Resp 18

## 2017-06-24 DIAGNOSIS — C50411 Malignant neoplasm of upper-outer quadrant of right female breast: Secondary | ICD-10-CM

## 2017-06-24 DIAGNOSIS — Z171 Estrogen receptor negative status [ER-]: Principal | ICD-10-CM

## 2017-06-24 DIAGNOSIS — Z5111 Encounter for antineoplastic chemotherapy: Secondary | ICD-10-CM | POA: Diagnosis not present

## 2017-06-24 LAB — CBC WITH DIFFERENTIAL/PLATELET
BASOS ABS: 0 10*3/uL (ref 0.0–0.1)
BASOS PCT: 0 %
EOS PCT: 1 %
Eosinophils Absolute: 0 10*3/uL (ref 0.0–0.5)
HCT: 27.8 % — ABNORMAL LOW (ref 34.8–46.6)
Hemoglobin: 9.1 g/dL — ABNORMAL LOW (ref 11.6–15.9)
LYMPHS PCT: 16 %
Lymphs Abs: 0.7 10*3/uL — ABNORMAL LOW (ref 0.9–3.3)
MCH: 34.6 pg — ABNORMAL HIGH (ref 25.1–34.0)
MCHC: 32.7 g/dL (ref 31.5–36.0)
MCV: 105.7 fL — ABNORMAL HIGH (ref 79.5–101.0)
MONO ABS: 0.2 10*3/uL (ref 0.1–0.9)
Monocytes Relative: 6 %
NEUTROS ABS: 3.2 10*3/uL (ref 1.5–6.5)
Neutrophils Relative %: 77 %
Platelets: 119 10*3/uL — ABNORMAL LOW (ref 145–400)
RBC: 2.63 MIL/uL — AB (ref 3.70–5.45)
RDW: 16.8 % — AB (ref 11.2–14.5)
WBC: 4.1 10*3/uL (ref 3.9–10.3)
nRBC: 1 /100 WBC — ABNORMAL HIGH

## 2017-06-24 LAB — COMPREHENSIVE METABOLIC PANEL
ALBUMIN: 3.8 g/dL (ref 3.5–5.0)
ALK PHOS: 99 U/L (ref 40–150)
ALT: 24 U/L (ref 0–55)
ANION GAP: 10 (ref 3–11)
AST: 22 U/L (ref 5–34)
BILIRUBIN TOTAL: 0.3 mg/dL (ref 0.2–1.2)
BUN: 10 mg/dL (ref 7–26)
CALCIUM: 8.8 mg/dL (ref 8.4–10.4)
CO2: 25 mmol/L (ref 22–29)
Chloride: 105 mmol/L (ref 98–109)
Creatinine, Ser: 0.74 mg/dL (ref 0.60–1.10)
GFR calc Af Amer: >60 mL/min (ref >60)
Glucose, Bld: 115 mg/dL (ref 70–140)
POTASSIUM: 3.4 mmol/L — AB (ref 3.5–5.1)
Sodium: 140 mmol/L (ref 136–145)
TOTAL PROTEIN: 6.4 g/dL (ref 6.4–8.3)

## 2017-06-24 MED ORDER — SODIUM CHLORIDE 0.9 % IV SOLN
50.0000 mg/m2 | Freq: Once | INTRAVENOUS | Status: AC
  Administered 2017-06-24: 90 mg via INTRAVENOUS
  Filled 2017-06-24: qty 15

## 2017-06-24 MED ORDER — FAMOTIDINE IN NACL 20-0.9 MG/50ML-% IV SOLN
INTRAVENOUS | Status: AC
  Filled 2017-06-24: qty 50

## 2017-06-24 MED ORDER — DIPHENHYDRAMINE HCL 50 MG/ML IJ SOLN
50.0000 mg | Freq: Once | INTRAMUSCULAR | Status: AC
  Administered 2017-06-24: 50 mg via INTRAVENOUS

## 2017-06-24 MED ORDER — HEPARIN SOD (PORK) LOCK FLUSH 100 UNIT/ML IV SOLN
500.0000 [IU] | Freq: Once | INTRAVENOUS | Status: AC | PRN
  Administered 2017-06-24: 500 [IU]
  Filled 2017-06-24: qty 5

## 2017-06-24 MED ORDER — SODIUM CHLORIDE 0.9 % IV SOLN: 10.0000 mg | Freq: Once | INTRAVENOUS | Status: DC

## 2017-06-24 MED ORDER — SODIUM CHLORIDE 0.9% FLUSH
10.0000 mL | INTRAVENOUS | Status: DC | PRN
  Administered 2017-06-24: 10 mL
  Filled 2017-06-24: qty 10

## 2017-06-24 MED ORDER — TBO-FILGRASTIM 480 MCG/0.8ML ~~LOC~~ SOSY: 480.0000 ug | PREFILLED_SYRINGE | Freq: Once | SUBCUTANEOUS | Status: DC

## 2017-06-24 MED ORDER — DEXAMETHASONE SODIUM PHOSPHATE 10 MG/ML IJ SOLN
INTRAMUSCULAR | Status: AC
  Filled 2017-06-24: qty 1

## 2017-06-24 MED ORDER — DEXAMETHASONE SODIUM PHOSPHATE 10 MG/ML IJ SOLN
10.0000 mg | Freq: Once | INTRAMUSCULAR | Status: AC
Start: 2017-06-24 — End: 2017-06-24
  Administered 2017-06-24: 10 mg via INTRAVENOUS

## 2017-06-24 MED ORDER — FAMOTIDINE IN NACL 20-0.9 MG/50ML-% IV SOLN
20.0000 mg | Freq: Once | INTRAVENOUS | Status: AC
  Administered 2017-06-24: 20 mg via INTRAVENOUS

## 2017-06-24 MED ORDER — SODIUM CHLORIDE 0.9 % IV SOLN
Freq: Once | INTRAVENOUS | Status: AC
  Administered 2017-06-24: 12:00:00 via INTRAVENOUS

## 2017-06-24 MED ORDER — DIPHENHYDRAMINE HCL 50 MG/ML IJ SOLN
INTRAMUSCULAR | Status: AC
Start: 2017-06-24 — End: ?
  Filled 2017-06-24: qty 1

## 2017-06-24 NOTE — Patient Instructions (Signed)
[facility] Discharge Instructions for Patients Receiving Chemotherapy  Today you received the following chemotherapy agents paclitaxel (Taxol) To help prevent nausea and vomiting after your treatment, we encourage you to take your nausea medication as directed   If you develop nausea and vomiting that is not controlled by your nausea medication, call the clinic.   BELOW ARE SYMPTOMS THAT SHOULD BE REPORTED IMMEDIATELY:  *FEVER GREATER THAN 100.5 F  *CHILLS WITH OR WITHOUT FEVER  NAUSEA AND VOMITING THAT IS NOT CONTROLLED WITH YOUR NAUSEA MEDICATION  *UNUSUAL SHORTNESS OF BREATH  *UNUSUAL BRUISING OR BLEEDING  TENDERNESS IN MOUTH AND THROAT WITH OR WITHOUT PRESENCE OF ULCERS  *URINARY PROBLEMS  *BOWEL PROBLEMS  UNUSUAL RASH Items with * indicate a potential emergency and should be followed up as soon as possible.  Feel free to call the clinic should you have any questions or concerns. The clinic phone number is [phone].  Please show the CHEMO ALERT CARD at check-in to the Emergency Department and triage nurse.   

## 2017-06-24 NOTE — Progress Notes (Signed)
Ok to give Taxol with heart rate of 115

## 2017-06-24 NOTE — Progress Notes (Signed)
Dr.Gudena ok to tx with HR 115. Pt noted feeling poorly after last tx and increase of decadron pre-medication. Per MD pharmacy making permanent change to decrease decadron steroid dose in treatment plan. Confirmed today is last tx for pt. No granix today or following treatment. Cancelled by pharmacy.

## 2017-06-25 ENCOUNTER — Ambulatory Visit (HOSPITAL_COMMUNITY)
Admission: RE | Admit: 2017-06-25 | Discharge: 2017-06-25 | Disposition: A | Discharge status: Home / Self Care | Payer: Medicaid Other | Source: Ambulatory Visit | Attending: Hematology and Oncology | Admitting: Hematology and Oncology

## 2017-06-25 DIAGNOSIS — R590 Localized enlarged lymph nodes: Secondary | ICD-10-CM | POA: Insufficient documentation

## 2017-06-25 DIAGNOSIS — C50411 Malignant neoplasm of upper-outer quadrant of right female breast: Secondary | ICD-10-CM

## 2017-06-25 DIAGNOSIS — Z171 Estrogen receptor negative status [ER-]: Secondary | ICD-10-CM | POA: Insufficient documentation

## 2017-06-25 MED ORDER — GADOBENATE DIMEGLUMINE 529 MG/ML IV SOLN
15.0000 mL | Freq: Once | INTRAVENOUS | Status: AC | PRN
  Administered 2017-06-25: 14 mL via INTRAVENOUS

## 2017-06-30 ENCOUNTER — Inpatient Hospital Stay (HOSPITAL_BASED_OUTPATIENT_CLINIC_OR_DEPARTMENT_OTHER): Payer: Medicaid Other | Admitting: Hematology and Oncology

## 2017-06-30 ENCOUNTER — Ambulatory Visit: Payer: Medicaid Other | Admitting: Hematology and Oncology

## 2017-06-30 DIAGNOSIS — Z171 Estrogen receptor negative status [ER-]: Secondary | ICD-10-CM | POA: Diagnosis not present

## 2017-06-30 DIAGNOSIS — C50411 Malignant neoplasm of upper-outer quadrant of right female breast: Secondary | ICD-10-CM | POA: Diagnosis not present

## 2017-06-30 DIAGNOSIS — Z9221 Personal history of antineoplastic chemotherapy: Secondary | ICD-10-CM | POA: Diagnosis not present

## 2017-06-30 DIAGNOSIS — Z9011 Acquired absence of right breast and nipple: Secondary | ICD-10-CM

## 2017-06-30 DIAGNOSIS — Z923 Personal history of irradiation: Secondary | ICD-10-CM

## 2017-06-30 DIAGNOSIS — Z5111 Encounter for antineoplastic chemotherapy: Secondary | ICD-10-CM | POA: Diagnosis not present

## 2017-06-30 NOTE — Assessment & Plan Note (Signed)
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 T3N0 stage IIIb  01/19/2017:Right breast 10 x 8.3 x 6.9 cm area of cancer involving all 4 quadrants with skin involvement extends from nipple to pectoralis muscle and involving right axillary lymph nodes, right internal mammary nodes  01/22/2017: CT CAP: 1.9 cm dome of the right hepatic lobe suspicious for liver metastases, 9 mm groundglass nodule in the right lung apex could be inflammation versus cancer Liver MRI: Lesion is benign involuted cyst  Treatment plan: 1.Neoadj chemo with dose dense AC foll by Taxoland carboplatin 01/28/17- 06/24/17 2. Mastectomy 3. XRT 4.Consideration for participation inSWOG [id number] clinical trial -------------------------------------------------------------------------------------------------------------------------------------------------- Breast MRI: Market positive response to neoadjuvant chemotherapy.  Majority of non-mass enhancement resolved.  Residual mass is smaller measuring 2.4 cm.  Improvement in right axillary adenopathy with a single axillary lymph node  Radiology review: I discussed with the patient that we reviewed her films at the breast tumor board and the recommendation is that she should undergo mastectomy and axillary lymph node dissection.  Return to clinic after surgery to discuss pathology report

## 2017-06-30 NOTE — Progress Notes (Signed)
Patient Care Team: Kathryn Hippo, MD as PCP - General (Family Medicine)  DIAGNOSIS:  Encounter Diagnosis  Name Primary?  . 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  T3N0 stage IIIb      01/19/2017 Breast MRI    Right breast 10 x 8.3 x 6.9 cm area of cancer involving all 4 quadrants with skin involvement extends from nipple to pectoralis muscle and involving right axillary lymph nodes, right internal mammary nodes      01/22/2017 Imaging    CT chest abdomen pelvis reveals Kathryn [name] 6 cm right breast mass, mild right axillary lymphadenopathy, 1.9 cm peripheral enhancing lesion in the dome of the right hepatic lobe, 9 mm groundglass nodule right lung apex      01/28/2017 -  Neo-Adjuvant Chemotherapy    Dose dense Adriamycin and Cytoxan  followed with Taxol and carboplatin       CHIEF COMPLIANT: Follow-up after undergoing breast MRI  INTERVAL HISTORY: Kathryn Kathryn [name] is Kathryn [name] 57 year old with above-mentioned history of triple negative breast cancer currently finished neoadjuvant chemotherapy and is here today to review the results of the breast MRI.  She was presented this morning in the multidisciplinary tumor board.  REVIEW OF SYSTEMS:   Constitutional: Denies fevers, chills or abnormal weight loss Eyes: Denies blurriness of vision Ears, nose, mouth, throat, and face: Denies mucositis or sore throat Respiratory: Denies cough, dyspnea or wheezes Cardiovascular: Denies  palpitation, chest discomfort Gastrointestinal:  Denies nausea, heartburn or change in bowel habits Skin: Denies abnormal skin rashes Lymphatics: Denies new lymphadenopathy or easy bruising Neurological:Denies numbness, tingling or new weaknesses Behavioral/Psych: Mood is stable, no new changes  Extremities: No lower extremity edema  All other systems were reviewed with the patient and are negative.  I have reviewed the past medical history, past surgical history, social history and family history with the patient and they are unchanged from previous note.  ALLERGIES:  has No Known Allergies.  MEDICATIONS:  Current Outpatient Medications  Medication Sig Dispense Refill  . cholecalciferol (VITAMIN D) 1000 UNITS tablet Take 1,000 Units by mouth daily.    . cholecalciferol (VITAMIN D) 1000 units tablet Take 1,000 Units by mouth daily.    . clonazePAM (KLONOPIN) 0.5 MG tablet Take 1 tablet (0.5 mg total) by mouth 3 (three) times daily as needed for anxiety. 90 tablet 5  . cyclobenzaprine (FLEXERIL) 10 MG tablet Take 10 mg by mouth 3 (three) times daily as needed for muscle spasms.    Marland Kitchen lidocaine-prilocaine (EMLA) cream Apply to affected area once (Patient taking differently: Apply 1 application topically 3 times/day as needed-between meals & bedtime. Apply to affected area once) 30 g 3  . LORazepam (ATIVAN) 0.5 MG tablet Take 1 tablet (0.5 mg total) by mouth at bedtime as needed (Nausea or vomiting). (Patient not taking: Reported on 06/16/2017) 30 tablet 0  . magic mouthwash w/lidocaine SOLN Take 5 mLs by mouth 2 (two) times daily as needed for mouth pain. (Patient not taking: Reported on  06/16/2017) 30 mL 0  . Misc Natural Products (ACAI+SUPERFRUIT/GREEN TEA) TABS Take 1 tablet by mouth daily.    . Multiple Vitamin (MULITIVITAMIN WITH MINERALS) TABS Take 1 tablet by mouth daily. Plus vit C 542m    . naproxen (NAPROSYN) 500 MG tablet Take 500 mg by mouth every 12 (twelve) hours as needed.    .  ondansetron (ZOFRAN) 8 MG tablet Take 1 tablet (8 mg total) by mouth 2 (two) times daily as needed. Start on the third day after chemotherapy. 30 tablet 1  . potassium chloride SA (K-DUR,KLOR-CON) 20 MEQ tablet Take 1 tablet (20 mEq total) by mouth 2 (two) times daily. (Patient not taking: Reported on 06/16/2017) 30 tablet 2  . prochlorperazine (COMPAZINE) 10 MG tablet Take 1 tablet (10 mg total) by mouth every 6 (six) hours as needed (Nausea or vomiting). (Patient not taking: Reported on 06/16/2017) 30 tablet 1  . vitamin C (ASCORBIC ACID) 250 MG tablet Take 250 mg by mouth daily.     No current facility-administered medications for this visit.     PHYSICAL EXAMINATION: ECOG PERFORMANCE STATUS: 1 - Symptomatic but completely ambulatory  Vitals:   06/30/17 1011  BP: 128/72  Pulse: (!) 120  Temp: 97.6 F (36.4 C)  SpO2: 99%   Filed Weights   06/30/17 1011  Weight: 153 lb 1.6 oz (69.4 kg)    GENERAL:alert, no distress and comfortable SKIN: skin color, texture, turgor are normal, no rashes or significant lesions EYES: normal, Conjunctiva are pink and non-injected, sclera clear OROPHARYNX:no exudate, no erythema and lips, buccal mucosa, and tongue normal  NECK: supple, thyroid normal size, non-tender, without nodularity LYMPH:  no palpable lymphadenopathy in the cervical, axillary or inguinal LUNGS: clear to auscultation and percussion with normal breathing effort HEART: regular rate & rhythm and no murmurs and no lower extremity edema ABDOMEN:abdomen soft, non-tender and normal bowel sounds MUSCULOSKELETAL:no cyanosis of digits and no clubbing  NEURO: alert & oriented x 3 with fluent speech, no focal motor/sensory deficits EXTREMITIES: No lower extremity edema  LABORATORY DATA:  I have reviewed the data as listed CMP Latest Ref Rng & Units 06/24/2017 06/18/2017 06/17/2017  Glucose 70 - 140 mg/dL 115 153(H) 160(H)  BUN 7 - 26 mg/dL _0 Creatinine 0.60 - 1.10 mg/dL 0.74 0.80 0.82    Sodium 136 - 145 mmol/L 140 140 139  Potassium 3.5 - 5.1 mmol/L 3.4(L) 3.1(L) 3.1(L)  Chloride 98 - 109 mmol/L 105 102 100  CO2 22 - 29 mmol/L _1 Calcium 8.4 - 10.4 mg/dL 8.8 9.4 9.3  Total Protein 6.4 - 8.3 g/dL 6.4 6.6 6.8  Total Bilirubin 0.2 - 1.2 mg/dL 0.3 0.3 0.3  Alkaline Phos 40 - 150 U/L 99 98 98  AST 5 - 34 U/L _2 ALT 0 - 55 U/L _3 Lab Results  Component Value Date   WBC 4.1 06/24/2017   HGB 9.1 (L) 06/24/2017   HCT 27.8 (L) 06/24/2017   MCV 105.7 (H) 06/24/2017   PLT 119 (L) 06/24/2017   NEUTROABS 3.2 06/24/2017    ASSESSMENT & PLAN:  Malignant neoplasm of upper-outer quadrant of right breast in [place], estrogen receptor negative [place] 12/29/2016: Palpable right breast lump for 6 months, mammogram revealed 6 cm mass at 1 o'clock position biopsy revealed 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 T3N0 stage IIIb  01/19/2017:Right breast 10 x 8.3 x 6.9 cm area of cancer involving all 4 quadrants with skin involvement extends from nipple to pectoralis muscle and involving right axillary lymph nodes, right internal mammary nodes  01/22/2017: CT CAP: 1.9 cm dome of the right hepatic lobe suspicious for liver metastases, 9 mm groundglass nodule in the right lung apex could be inflammation versus cancer Liver MRI: Lesion is benign involuted cyst  Treatment plan: 1.Neoadj chemo with dose dense AC foll by Taxoland carboplatin 01/28/17- 06/24/17 2. Mastectomy 3. XRT 4.Consideration for participation inSWOG [phone] clinical trial -------------------------------------------------------------------------------------------------------------------------------------------------- Breast MRI: Market positive response to neoadjuvant chemotherapy.  Majority of non-mass enhancement resolved.  Residual mass is smaller  measuring 2.4 cm.  Improvement in right axillary adenopathy with Kathryn [name] single axillary lymph node  Radiology review: I discussed with the patient that we reviewed her films at the breast tumor board and the recommendation is that she should undergo mastectomy and axillary lymph node dissection.  She was very reluctant to accept mastectomy.  She will discuss with Dr. Dalbert Batman if there was any chance to do Kathryn [name] lumpectomy instead.  I informed her that the tumor board recommended mastectomy.  She would like to see plastic surgery.  We sent Kathryn [name] referral to Dr. Iran Planas Patient was previously referred to genetics but she canceled it.  I strongly urged her to undergo genetic testing as soon as possible.  She is now more willing to do genetic testing.  Return to clinic after surgery to discuss pathology report   No orders of the defined types were placed in this encounter.  The patient has Kathryn [name] good understanding of the overall plan. she agrees with it. she will call with any problems that may develop before the next visit here.   Harriette Ohara, MD 06/30/17

## 2017-07-01 ENCOUNTER — Encounter: Payer: Self-pay | Admitting: *Deleted

## 2017-07-01 ENCOUNTER — Encounter: Payer: Self-pay | Admitting: Genetics

## 2017-07-01 ENCOUNTER — Inpatient Hospital Stay: Payer: Medicaid Other | Admitting: Genetics

## 2017-07-01 ENCOUNTER — Telehealth: Payer: Self-pay | Admitting: Genetics

## 2017-07-01 ENCOUNTER — Inpatient Hospital Stay: Payer: Medicaid Other

## 2017-07-01 ENCOUNTER — Telehealth: Payer: Self-pay | Admitting: *Deleted

## 2017-07-01 DIAGNOSIS — Z1379 Encounter for other screening for genetic and chromosomal anomalies: Secondary | ICD-10-CM | POA: Insufficient documentation

## 2017-07-01 NOTE — Telephone Encounter (Signed)
"  I'm scheduled today at 4:00 pm for a genetic appointment, will not be in, left message with scheduling but want to make sure someone knows before the end of the day."

## 2017-07-01 NOTE — Telephone Encounter (Signed)
Patient called and said she would not be making her genetics appointment.  She said she is not interested in rescheduling it.

## 2017-07-14 ENCOUNTER — Other Ambulatory Visit: Payer: Self-pay | Admitting: General Surgery

## 2017-07-30 ENCOUNTER — Other Ambulatory Visit: Payer: Self-pay | Admitting: General Surgery

## 2017-07-30 DIAGNOSIS — C50211 Malignant neoplasm of upper-inner quadrant of right female breast: Secondary | ICD-10-CM

## 2017-07-30 DIAGNOSIS — Z171 Estrogen receptor negative status [ER-]: Principal | ICD-10-CM

## 2017-08-02 ENCOUNTER — Encounter: Payer: Self-pay | Admitting: *Deleted

## 2017-08-02 DIAGNOSIS — Z171 Estrogen receptor negative status [ER-]: Principal | ICD-10-CM

## 2017-08-02 DIAGNOSIS — C50411 Malignant neoplasm of upper-outer quadrant of right female breast: Secondary | ICD-10-CM

## 2017-08-03 ENCOUNTER — Telehealth: Payer: Self-pay | Admitting: Hematology and Oncology

## 2017-08-03 ENCOUNTER — Other Ambulatory Visit: Payer: Self-pay

## 2017-08-03 ENCOUNTER — Encounter (HOSPITAL_BASED_OUTPATIENT_CLINIC_OR_DEPARTMENT_OTHER): Payer: Self-pay | Admitting: *Deleted

## 2017-08-03 NOTE — Telephone Encounter (Signed)
Mailed patient calendar of upcoming aug appts per 7/22 sch message

## 2017-08-05 ENCOUNTER — Encounter: Payer: Self-pay | Admitting: Radiation Oncology

## 2017-08-08 NOTE — H&P (Addendum)
Kathryn [name] Location: [facility] Patient #: [id number] DOB: [birth year] Divorced / Language: English / Race: White Female       History of Present Illness         This is a 57 year old female with cancer of the right breast, status post neoadjuvant chemotherapy. Dr. Lindi Adie and Dr. Sondra Come are involved in her care. Dr. Dennie Bible is her PCP. Kathryn [name] was my chaperone throughout the encounter.      Past history of bilateral breast biopsies for benign disease when she was young She presented with palpable mass right breast upper inner quadrant. She had felt this for 6 months. Imaging studies showed a 6 cm mass with calcifications in the right breast, 2 o'clock position and numerous other imaging findings A small density at the 12 o'clock position on the right was biopsied and showed fibroadenoma small densities at 6:00 and 8:00 proved to be benign cysts and indeterminate density in the right breast upper inner quadrant with biopsy of this was recommended but she declined to do that In the left breast she had a biopsy-proven fibroadenoma and benign cyst      The dominant mass in the right breast was biopsied and showed invasive duct carcinoma, grade 3, triple negative breast cancer staged T3, N0 Initial MRI showed extensive enhancement all the way from the nipple to the chest wall 10 cm area of malignancy and skin involvement She was opposed to mastectomy when we initially met her and she remains opposed to mastectomy Port-A-Cath was placed and chemotherapy was delivered End of treatment MRI did show significant partial response majority of the non-mass enhancement resolved. The residual mass was 2.4 cm. There has been improvement in the right axillary adenopathy but one lymph node remains enlarged. Left breast remains normal      Consensus recommendation at breast conference was modified radical mastectomy followed by radiation therapy and consideration for protocol  chemotherapy. Dr. Lindi Adie told her to leave the port in. Genetics has been offered twice and she has declined twice       I saw her two weeks ago and reiterated the recommendation of modified radical mastectomy. She declined and wanted to go home and think about it She returns today and states that she has talked to numerous friends and his prayed about this and will not have a mastectomy. I have told her that this is inferior local control to the mastectomy but that lumpectomy and axillary node dissection would be better than no surgery at all. I told her that if she had multiple positive margins that I would recommend mastectomy.       I agreed to proceed with right breast lumpectomy with radioactive seed localization and axillary lymph node dissection to see if we could get her in the operating room and assess her disease. Once again I offered a second surgical opinion and she declined. I discussed the indications, details, techniques, numerous risk of the surgery with her. She is aware the risks of bleeding, infection, recurrence, cosmetic deformity, reoperation for positive margins, arm swelling, arm numbness. She knows she'll have a drain. She knows she'll need physical therapy. She knows she'll have to stay in the hospital overnight. She understands all these issues. All her questions were answered. She agrees with this plan.       Comorbidities include endometriosis. She thinks both tubes and ovaries were removed by Dr. [name]. Bilateral breast biopsies and depression. Family history mother living. Father died of lung cancer.  Paternal aunt had breast cancer. Paternal cousin had breast cancer. No ovarian cancer. Social history divorced. Unemployed. [facility] patient. 2 children. No tobacco. Occasional alcohol   Allergies  Allergies Reconciled   Medication History  ClonazePAM (0.'5MG'$  Tablet, Oral) Active. Cyclobenzaprine HCl ('10MG'$  Tablet, Oral) Active. Naproxen ('500MG'$   Tablet, Oral) Active. Vitamin D (1000UNIT Tablet, Oral) Active. Multiple Vitamin (1 (one) Oral) Active. Medications Reconciled  Vitals   Weight: 152 lb Height: 65in Body Surface Area: 1.76 m Body Mass Index: 25.29 kg/m  Temp.: 24F  Pulse: 98 (Regular)  Resp.: 18 (Unlabored)  P.OX: 98% (Room air) BP: 120/84 (Sitting, Left Arm, Standard)     Physical Exam  General Mental Status-Alert. General Appearance-Not in acute distress. Build & Nutrition-Well nourished. Posture-Normal posture. Gait-Normal. Note: Pleasant. No distress. Wearing a wig. Somewhat anxious   Head and Neck Head-normocephalic, atraumatic with no lesions or palpable masses. Trachea-midline. Thyroid Gland Characteristics - normal size and consistency and no palpable nodules.  Chest and Lung Exam Chest and lung exam reveals -on auscultation, normal breath sounds, no adventitious sounds and normal vocal resonance.  Breast Note: Port site left infraclavicular area looks fine. Right breast reveals the palpable mass is mostly resolved. The breast is much softer. Skin looks uninvolved. No right axillary mass. Left breast exam unremarkable. Well-healed circumareolar incision at the areolar margin upper outer quadrant old biopsy. No cervical adenopathy.   Cardiovascular Cardiovascular examination reveals -normal heart sounds, regular rate and rhythm with no murmurs and femoral artery auscultation bilaterally reveals normal pulses, no bruits, no thrills.  Abdomen Inspection Inspection of the abdomen reveals - No Hernias. Palpation/Percussion Palpation and Percussion of the abdomen reveal - Soft, Non Tender, No Rigidity (guarding), No hepatosplenomegaly and No Palpable abdominal masses.  Neurologic Neurologic evaluation reveals -alert and oriented x 3 with no impairment of recent or remote memory, normal attention span and ability to concentrate, normal sensation and normal  coordination.  Musculoskeletal Normal Exam - Bilateral-Upper Extremity Strength Normal and Lower Extremity Strength Normal.    Assessment & Plan  PRIMARY CANCER OF UPPER INNER QUADRANT OF RIGHT FEMALE BREAST (C50.211)   We have had another [place] discussion today You told me that you have talked this over with several friends and have prayed a lot about this you stated you do not want a mastectomy but are willing to undergo lumpectomy and axillary lymph node dissection  We will do this in an attempt to control your breast cancer I have told you once again that I feel that this is inferior local control but is better than no surgery at all This option is associated with increased risk of recurrence  If the lumpectomy shows multiple positive margins I will recommend that you have a second operation which would be a mastectomy  You are aware that she will need radiation therapy regardless of the extent of surgery  you will be scheduled for right breast lumpectomy with radioactive seed localization and right axillary lymph node dissection The Port-A-Cath will be left in I have discussed the indications, techniques, and risk of the surgery in detail with you  HISTORY OF HYSTERECTOMY (Z90.710) HISTORY OF ENDOMETRIOSIS (Z87.42) HISTORY OF BILATERAL BREAST BIOPSY (Z98.890)    Edsel Petrin. Dalbert Batman, M.D., [facility] General and Minimally invasive Surgery Breast and Colorectal Surgery Office:   [phone] Pager:   [phone]

## 2017-08-09 NOTE — Progress Notes (Signed)
Ensure pre surgery drink given with instructions to complete by 0400 dos,surgical soap given with instructions,  pt verbalized understanding. 

## 2017-08-10 ENCOUNTER — Ambulatory Visit (HOSPITAL_BASED_OUTPATIENT_CLINIC_OR_DEPARTMENT_OTHER)
Admission: RE | Admit: 2017-08-10 | Discharge: 2017-08-11 | Disposition: A | Discharge status: Home / Self Care | Payer: Medicaid Other | Source: Ambulatory Visit | Attending: General Surgery | Admitting: General Surgery

## 2017-08-10 ENCOUNTER — Encounter (HOSPITAL_BASED_OUTPATIENT_CLINIC_OR_DEPARTMENT_OTHER): Payer: Self-pay | Admitting: *Deleted

## 2017-08-10 ENCOUNTER — Ambulatory Visit (HOSPITAL_BASED_OUTPATIENT_CLINIC_OR_DEPARTMENT_OTHER): Payer: Medicaid Other | Admitting: Anesthesiology

## 2017-08-10 ENCOUNTER — Encounter (HOSPITAL_BASED_OUTPATIENT_CLINIC_OR_DEPARTMENT_OTHER)
Admission: RE | Disposition: A | Discharge status: Home / Self Care | Payer: Self-pay | Source: Ambulatory Visit | Attending: General Surgery

## 2017-08-10 ENCOUNTER — Other Ambulatory Visit: Payer: Self-pay

## 2017-08-10 DIAGNOSIS — D241 Benign neoplasm of right breast: Secondary | ICD-10-CM | POA: Diagnosis not present

## 2017-08-10 DIAGNOSIS — N809 Endometriosis, unspecified: Secondary | ICD-10-CM | POA: Insufficient documentation

## 2017-08-10 DIAGNOSIS — Z171 Estrogen receptor negative status [ER-]: Secondary | ICD-10-CM | POA: Insufficient documentation

## 2017-08-10 DIAGNOSIS — Z79899 Other long term (current) drug therapy: Secondary | ICD-10-CM | POA: Diagnosis not present

## 2017-08-10 DIAGNOSIS — Z803 Family history of malignant neoplasm of breast: Secondary | ICD-10-CM | POA: Insufficient documentation

## 2017-08-10 DIAGNOSIS — Z801 Family history of malignant neoplasm of trachea, bronchus and lung: Secondary | ICD-10-CM | POA: Diagnosis not present

## 2017-08-10 DIAGNOSIS — C773 Secondary and unspecified malignant neoplasm of axilla and upper limb lymph nodes: Secondary | ICD-10-CM | POA: Diagnosis not present

## 2017-08-10 DIAGNOSIS — C50211 Malignant neoplasm of upper-inner quadrant of right female breast: Secondary | ICD-10-CM | POA: Diagnosis not present

## 2017-08-10 DIAGNOSIS — Z9221 Personal history of antineoplastic chemotherapy: Secondary | ICD-10-CM | POA: Diagnosis not present

## 2017-08-10 DIAGNOSIS — F329 Major depressive disorder, single episode, unspecified: Secondary | ICD-10-CM | POA: Diagnosis not present

## 2017-08-10 DIAGNOSIS — C50911 Malignant neoplasm of unspecified site of right female breast: Secondary | ICD-10-CM | POA: Diagnosis present

## 2017-08-10 DIAGNOSIS — C50411 Malignant neoplasm of upper-outer quadrant of right female breast: Secondary | ICD-10-CM

## 2017-08-10 HISTORY — PX: BREAST LUMPECTOMY WITH RADIOACTIVE SEED AND AXILLARY LYMPH NODE DISSECTION: SHX6656

## 2017-08-10 SURGERY — BREAST LUMPECTOMY WITH RADIOACTIVE SEED AND AXILLARY LYMPH NODE DISSECTION
Anesthesia: General | Site: Breast | Laterality: Right

## 2017-08-10 MED ORDER — MIDAZOLAM HCL 2 MG/2ML IJ SOLN
1.0000 mg | INTRAMUSCULAR | Status: DC | PRN
  Administered 2017-08-10 (×2): 1 mg via INTRAVENOUS

## 2017-08-10 MED ORDER — CHLORHEXIDINE GLUCONATE CLOTH 2 % EX PADS: 6.0000 | MEDICATED_PAD | Freq: Once | CUTANEOUS | Status: DC

## 2017-08-10 MED ORDER — CELECOXIB 200 MG PO CAPS
200.0000 mg | ORAL_CAPSULE | ORAL | Status: AC
  Administered 2017-08-10: 200 mg via ORAL

## 2017-08-10 MED ORDER — OXYCODONE HCL 5 MG PO TABS: 5.0000 mg | ORAL_TABLET | Freq: Once | ORAL | Status: DC | PRN

## 2017-08-10 MED ORDER — SCOPOLAMINE 1 MG/3DAYS TD PT72: 1.0000 | MEDICATED_PATCH | Freq: Once | TRANSDERMAL | Status: DC | PRN

## 2017-08-10 MED ORDER — PHENYLEPHRINE 40 MCG/ML (10ML) SYRINGE FOR IV PUSH (FOR BLOOD PRESSURE SUPPORT)
PREFILLED_SYRINGE | INTRAVENOUS | Status: DC | PRN
  Administered 2017-08-10 (×3): 40 ug via INTRAVENOUS
  Administered 2017-08-10: 80 ug via INTRAVENOUS
  Administered 2017-08-10 (×2): 40 ug via INTRAVENOUS
  Administered 2017-08-10 (×3): 80 ug via INTRAVENOUS
  Administered 2017-08-10 (×3): 40 ug via INTRAVENOUS
  Administered 2017-08-10 (×2): 80 ug via INTRAVENOUS

## 2017-08-10 MED ORDER — CEFAZOLIN SODIUM-DEXTROSE 2-4 GM/100ML-% IV SOLN
2.0000 g | INTRAVENOUS | Status: AC
  Administered 2017-08-10: 2 g via INTRAVENOUS

## 2017-08-10 MED ORDER — ONDANSETRON HCL 4 MG/2ML IJ SOLN: 4.0000 mg | Freq: Once | INTRAMUSCULAR | Status: DC | PRN

## 2017-08-10 MED ORDER — DEXAMETHASONE SODIUM PHOSPHATE 10 MG/ML IJ SOLN
INTRAMUSCULAR | Status: DC | PRN
  Administered 2017-08-10: 10 mg via INTRAVENOUS

## 2017-08-10 MED ORDER — CLONAZEPAM 0.5 MG PO TABS: 0.5000 mg | ORAL_TABLET | Freq: Three times a day (TID) | ORAL | Status: DC | PRN

## 2017-08-10 MED ORDER — PHENYLEPHRINE 40 MCG/ML (10ML) SYRINGE FOR IV PUSH (FOR BLOOD PRESSURE SUPPORT)
PREFILLED_SYRINGE | INTRAVENOUS | Status: AC
  Filled 2017-08-10: qty 10

## 2017-08-10 MED ORDER — FENTANYL CITRATE (PF) 100 MCG/2ML IJ SOLN
INTRAMUSCULAR | Status: AC
  Filled 2017-08-10: qty 2

## 2017-08-10 MED ORDER — ONDANSETRON HCL 4 MG/2ML IJ SOLN
INTRAMUSCULAR | Status: DC | PRN
  Administered 2017-08-10 (×2): 4 mg via INTRAVENOUS

## 2017-08-10 MED ORDER — BUPIVACAINE-EPINEPHRINE (PF) 0.25% -1:200000 IJ SOLN
INTRAMUSCULAR | Status: AC
  Filled 2017-08-10: qty 30

## 2017-08-10 MED ORDER — BUPIVACAINE-EPINEPHRINE (PF) 0.5% -1:200000 IJ SOLN
INTRAMUSCULAR | Status: AC
  Filled 2017-08-10: qty 3.6

## 2017-08-10 MED ORDER — CELECOXIB 200 MG PO CAPS
ORAL_CAPSULE | ORAL | Status: AC
  Filled 2017-08-10: qty 1

## 2017-08-10 MED ORDER — BUPIVACAINE-EPINEPHRINE 0.25% -1:200000 IJ SOLN
INTRAMUSCULAR | Status: DC | PRN
  Administered 2017-08-10: 18 mL

## 2017-08-10 MED ORDER — ONDANSETRON HCL 4 MG/2ML IJ SOLN: 4.0000 mg | Freq: Four times a day (QID) | INTRAMUSCULAR | Status: DC | PRN

## 2017-08-10 MED ORDER — DEXAMETHASONE SODIUM PHOSPHATE 10 MG/ML IJ SOLN
INTRAMUSCULAR | Status: AC
  Filled 2017-08-10: qty 1

## 2017-08-10 MED ORDER — CYCLOBENZAPRINE HCL 10 MG PO TABS: 10.0000 mg | ORAL_TABLET | Freq: Three times a day (TID) | ORAL | Status: DC | PRN

## 2017-08-10 MED ORDER — HYDROMORPHONE HCL 1 MG/ML IJ SOLN: 1.0000 mg | INTRAMUSCULAR | Status: DC | PRN

## 2017-08-10 MED ORDER — PROPOFOL 10 MG/ML IV BOLUS
INTRAVENOUS | Status: DC | PRN
  Administered 2017-08-10: 170 mg via INTRAVENOUS

## 2017-08-10 MED ORDER — GABAPENTIN 300 MG PO CAPS
ORAL_CAPSULE | ORAL | Status: AC
  Filled 2017-08-10: qty 1

## 2017-08-10 MED ORDER — ACETAMINOPHEN 500 MG PO TABS
1000.0000 mg | ORAL_TABLET | ORAL | Status: AC
  Administered 2017-08-10: 1000 mg via ORAL

## 2017-08-10 MED ORDER — LACTATED RINGERS IV SOLN
INTRAVENOUS | Status: DC
  Administered 2017-08-10 (×3): via INTRAVENOUS

## 2017-08-10 MED ORDER — ONDANSETRON 4 MG PO TBDP: 4.0000 mg | ORAL_TABLET | Freq: Four times a day (QID) | ORAL | Status: DC | PRN

## 2017-08-10 MED ORDER — OXYCODONE HCL 5 MG/5ML PO SOLN: 5.0000 mg | Freq: Once | ORAL | Status: DC | PRN

## 2017-08-10 MED ORDER — PROPOFOL 10 MG/ML IV BOLUS
INTRAVENOUS | Status: AC
  Filled 2017-08-10: qty 20

## 2017-08-10 MED ORDER — METHYLENE BLUE 0.5 % INJ SOLN
INTRAVENOUS | Status: AC
  Filled 2017-08-10: qty 10

## 2017-08-10 MED ORDER — FENTANYL CITRATE (PF) 100 MCG/2ML IJ SOLN
50.0000 ug | INTRAMUSCULAR | Status: DC | PRN
  Administered 2017-08-10 (×2): 50 ug via INTRAVENOUS

## 2017-08-10 MED ORDER — SENNA 8.6 MG PO TABS
1.0000 | ORAL_TABLET | Freq: Two times a day (BID) | ORAL | Status: DC
  Administered 2017-08-10: 8.6 mg via ORAL
  Filled 2017-08-10: qty 1

## 2017-08-10 MED ORDER — ENOXAPARIN SODIUM 40 MG/0.4ML ~~LOC~~ SOLN: 40.0000 mg | SUBCUTANEOUS | Status: DC

## 2017-08-10 MED ORDER — CEFAZOLIN SODIUM-DEXTROSE 2-4 GM/100ML-% IV SOLN
INTRAVENOUS | Status: AC
  Filled 2017-08-10: qty 100

## 2017-08-10 MED ORDER — METHOCARBAMOL 500 MG PO TABS: 500.0000 mg | ORAL_TABLET | Freq: Four times a day (QID) | ORAL | Status: DC | PRN

## 2017-08-10 MED ORDER — LACTATED RINGERS IV SOLN
INTRAVENOUS | Status: DC
  Administered 2017-08-10: 12:00:00 via INTRAVENOUS

## 2017-08-10 MED ORDER — GABAPENTIN 300 MG PO CAPS
300.0000 mg | ORAL_CAPSULE | Freq: Two times a day (BID) | ORAL | Status: DC
  Administered 2017-08-10: 300 mg via ORAL
  Filled 2017-08-10: qty 1

## 2017-08-10 MED ORDER — ACETAMINOPHEN 500 MG PO TABS
ORAL_TABLET | ORAL | Status: AC
  Filled 2017-08-10: qty 2

## 2017-08-10 MED ORDER — SODIUM CHLORIDE 0.9 % IJ SOLN
INTRAMUSCULAR | Status: AC
  Filled 2017-08-10: qty 10

## 2017-08-10 MED ORDER — HYDROCODONE-ACETAMINOPHEN 5-325 MG PO TABS
1.0000 | ORAL_TABLET | ORAL | Status: DC | PRN
  Administered 2017-08-10: 1 via ORAL
  Filled 2017-08-10: qty 1

## 2017-08-10 MED ORDER — GABAPENTIN 300 MG PO CAPS
300.0000 mg | ORAL_CAPSULE | ORAL | Status: AC
  Administered 2017-08-10: 300 mg via ORAL

## 2017-08-10 MED ORDER — CELECOXIB 200 MG PO CAPS
200.0000 mg | ORAL_CAPSULE | Freq: Two times a day (BID) | ORAL | Status: DC
  Administered 2017-08-10: 200 mg via ORAL
  Filled 2017-08-10: qty 1

## 2017-08-10 MED ORDER — TRAMADOL HCL 50 MG PO TABS: 50.0000 mg | ORAL_TABLET | Freq: Four times a day (QID) | ORAL | Status: DC | PRN

## 2017-08-10 MED ORDER — LIDOCAINE HCL (CARDIAC) PF 100 MG/5ML IV SOSY
PREFILLED_SYRINGE | INTRAVENOUS | Status: AC
  Filled 2017-08-10: qty 5

## 2017-08-10 MED ORDER — MIDAZOLAM HCL 2 MG/2ML IJ SOLN
INTRAMUSCULAR | Status: AC
  Filled 2017-08-10: qty 2

## 2017-08-10 MED ORDER — LIDOCAINE 2% (20 MG/ML) 5 ML SYRINGE
INTRAMUSCULAR | Status: DC | PRN
  Administered 2017-08-10: 60 mg via INTRAVENOUS

## 2017-08-10 MED ORDER — ONDANSETRON HCL 4 MG/2ML IJ SOLN
INTRAMUSCULAR | Status: AC
  Filled 2017-08-10: qty 4

## 2017-08-10 MED ORDER — FENTANYL CITRATE (PF) 100 MCG/2ML IJ SOLN
25.0000 ug | INTRAMUSCULAR | Status: DC | PRN
  Administered 2017-08-10: 25 ug via INTRAVENOUS

## 2017-08-10 SURGICAL SUPPLY — 66 items
ADH SKN CLS APL DERMABOND .7 (GAUZE/BANDAGES/DRESSINGS) ×1
APPLIER CLIP 9.375 MED OPEN (MISCELLANEOUS) ×6
BENZOIN TINCTURE PRP APPL 2/3 (GAUZE/BANDAGES/DRESSINGS) IMPLANT
BINDER BREAST LRG (GAUZE/BANDAGES/DRESSINGS) ×3 IMPLANT
BINDER BREAST MEDIUM (GAUZE/BANDAGES/DRESSINGS) IMPLANT
BINDER BREAST XLRG (GAUZE/BANDAGES/DRESSINGS) IMPLANT
BINDER BREAST XXLRG (GAUZE/BANDAGES/DRESSINGS) IMPLANT
BLADE HEX COATED 2.75 (ELECTRODE) ×3 IMPLANT
BLADE SURG 10 STRL SS (BLADE) IMPLANT
BLADE SURG 15 STRL LF DISP TIS (BLADE) ×1 IMPLANT
BLADE SURG 15 STRL SS (BLADE) ×2
CANISTER SUC SOCK COL 7IN (MISCELLANEOUS) IMPLANT
CANISTER SUCT 1200ML W/VALVE (MISCELLANEOUS) ×3 IMPLANT
CHLORAPREP W/TINT 26ML (MISCELLANEOUS) ×3 IMPLANT
CLIP APPLIE 9.375 MED OPEN (MISCELLANEOUS) ×2 IMPLANT
CLOSURE WOUND 1/2 X4 (GAUZE/BANDAGES/DRESSINGS)
COVER BACK TABLE 60X90IN (DRAPES) ×3 IMPLANT
COVER MAYO STAND STRL (DRAPES) ×3 IMPLANT
COVER PROBE W GEL 5X96 (DRAPES) ×3 IMPLANT
DECANTER SPIKE VIAL GLASS SM (MISCELLANEOUS) IMPLANT
DERMABOND ADVANCED (GAUZE/BANDAGES/DRESSINGS) ×2
DERMABOND ADVANCED .7 DNX12 (GAUZE/BANDAGES/DRESSINGS) ×1 IMPLANT
DEVICE DUBIN W/COMP PLATE 8390 (MISCELLANEOUS) ×3 IMPLANT
DRAPE LAPAROSCOPIC ABDOMINAL (DRAPES) ×3 IMPLANT
DRAPE UTILITY XL STRL (DRAPES) ×3 IMPLANT
DRSG PAD ABDOMINAL 8X10 ST (GAUZE/BANDAGES/DRESSINGS) ×3 IMPLANT
ELECT REM PT RETURN 9FT ADLT (ELECTROSURGICAL) ×3
ELECTRODE REM PT RTRN 9FT ADLT (ELECTROSURGICAL) ×1 IMPLANT
GAUZE SPONGE 4X4 12PLY STRL (GAUZE/BANDAGES/DRESSINGS) ×3 IMPLANT
GLOVE BIOGEL PI IND STRL 7.0 (GLOVE) ×1 IMPLANT
GLOVE BIOGEL PI INDICATOR 7.0 (GLOVE) ×2
GLOVE ECLIPSE 6.5 STRL STRAW (GLOVE) ×3 IMPLANT
GLOVE EUDERMIC 7 POWDERFREE (GLOVE) ×6 IMPLANT
GOWN STRL REUS W/ TWL LRG LVL3 (GOWN DISPOSABLE) ×1 IMPLANT
GOWN STRL REUS W/ TWL XL LVL3 (GOWN DISPOSABLE) ×1 IMPLANT
GOWN STRL REUS W/TWL LRG LVL3 (GOWN DISPOSABLE) ×3
GOWN STRL REUS W/TWL XL LVL3 (GOWN DISPOSABLE) ×2
ILLUMINATOR WAVEGUIDE N/F (MISCELLANEOUS) IMPLANT
KIT MARKER MARGIN INK (KITS) ×3 IMPLANT
LIGHT WAVEGUIDE WIDE FLAT (MISCELLANEOUS) IMPLANT
NDL SAFETY ECLIPSE 18X1.5 (NEEDLE) IMPLANT
NEEDLE HYPO 18GX1.5 SHARP (NEEDLE)
NEEDLE HYPO 25X1 1.5 SAFETY (NEEDLE) ×3 IMPLANT
NS IRRIG 1000ML POUR BTL (IV SOLUTION) ×3 IMPLANT
PACK BASIN DAY SURGERY FS (CUSTOM PROCEDURE TRAY) ×3 IMPLANT
PENCIL BUTTON HOLSTER BLD 10FT (ELECTRODE) ×3 IMPLANT
SHEET MEDIUM DRAPE 40X70 STRL (DRAPES) ×3 IMPLANT
SLEEVE SCD COMPRESS KNEE MED (MISCELLANEOUS) ×3 IMPLANT
SPONGE LAP 18X18 RF (DISPOSABLE) ×3 IMPLANT
SPONGE LAP 4X18 RFD (DISPOSABLE) ×6 IMPLANT
STRIP CLOSURE SKIN 1/2X4 (GAUZE/BANDAGES/DRESSINGS) IMPLANT
SUT ETHILON 3 0 PS 1 (SUTURE) ×3 IMPLANT
SUT MNCRL AB 4-0 PS2 18 (SUTURE) ×3 IMPLANT
SUT SILK 2 0 SH (SUTURE) ×3 IMPLANT
SUT VIC AB 2-0 CT1 27 (SUTURE)
SUT VIC AB 2-0 CT1 TAPERPNT 27 (SUTURE) IMPLANT
SUT VIC AB 2-0 SH 18 (SUTURE) ×6 IMPLANT
SUT VIC AB 3-0 SH 27 (SUTURE)
SUT VIC AB 3-0 SH 27X BRD (SUTURE) IMPLANT
SUT VICRYL 3-0 CR8 SH (SUTURE) ×3 IMPLANT
SYR 10ML LL (SYRINGE) ×6 IMPLANT
TOWEL GREEN STERILE FF (TOWEL DISPOSABLE) ×3 IMPLANT
TOWEL OR NON WOVEN STRL DISP B (DISPOSABLE) ×3 IMPLANT
TUBE CONNECTING 20'X1/4 (TUBING) ×1
TUBE CONNECTING 20X1/4 (TUBING) ×2 IMPLANT
YANKAUER SUCT BULB TIP NO VENT (SUCTIONS) ×3 IMPLANT

## 2017-08-10 NOTE — Anesthesia Postprocedure Evaluation (Signed)
Anesthesia Post Note  Patient: Kathryn [name]  Procedure(s) Performed: BREAST LUMPECTOMY WITH RADIOACTIVE SEED (X2)  AND AXILLARY LYMPH NODE DISSECTION (Right Breast)     Patient location during evaluation: PACU Anesthesia Type: General Level of consciousness: awake and alert Pain management: pain level controlled Vital Signs Assessment: post-procedure vital signs reviewed and stable Respiratory status: spontaneous breathing, nonlabored ventilation, respiratory function stable and patient connected to nasal cannula oxygen Cardiovascular status: blood pressure returned to baseline and stable Postop Assessment: no apparent nausea or vomiting Anesthetic complications: no    Last Vitals:  Vitals:   08/10/17 2000 08/10/17 2100  BP: 113/70   Pulse: 100   Resp: 20   Temp: (!) 36.1 C   SpO2: 99% 97%    Last Pain:  Vitals:   08/10/17 2100  TempSrc:   PainSc: 1                  Mccartney Chuba COKER

## 2017-08-10 NOTE — Transfer of Care (Signed)
Immediate Anesthesia Transfer of Care Note  Patient: Kathryn [name]  Procedure(s) Performed: BREAST LUMPECTOMY WITH RADIOACTIVE SEED (X2)  AND AXILLARY LYMPH NODE DISSECTION (Right Breast)  Patient Location: PACU  Anesthesia Type:General  Level of Consciousness: drowsy  Airway & Oxygen Therapy: Patient Spontanous Breathing and Patient connected to face mask oxygen  Post-op Assessment: Report given to RN and Post -op Vital signs reviewed and stable  Post vital signs: Reviewed and stable  Last Vitals:  Vitals Value Taken Time  BP 92/53 08/10/2017 10:00 AM  Temp    Pulse 92 08/10/2017 10:04 AM  Resp 16 08/10/2017 10:04 AM  SpO2 100 % 08/10/2017 10:04 AM  Vitals shown include unvalidated device data.  Last Pain:  Vitals:   08/10/17 0639  TempSrc: Oral  PainSc: 0-No pain         Complications: No apparent anesthesia complications

## 2017-08-10 NOTE — Interval H&P Note (Signed)
History and Physical Interval Note:  08/10/2017 6:36 AM  Kathryn [name]  has presented today for surgery, with the diagnosis of RIGHT BREAST CANCER  The various methods of treatment have been discussed with the patient and family. After consideration of risks, benefits and other options for treatment, the patient has consented to  Procedure(s): BREAST LUMPECTOMY WITH RADIOACTIVE SEED (X2)  AND AXILLARY LYMPH NODE DISSECTION (Right) as a surgical intervention .  The patient's history has been reviewed, patient examined, no change in status, stable for surgery.  I have reviewed the patient's chart and labs.  Questions were answered to the patient's satisfaction.     Adin Hector

## 2017-08-10 NOTE — Anesthesia Procedure Notes (Signed)
Anesthesia Regional Block: Pectoralis block   Pre-Anesthetic Checklist: ,, timeout performed, Correct Patient, Correct Site, Correct Laterality, Correct Procedure, Correct Position, site marked, Risks and benefits discussed,  Surgical consent,  Pre-op evaluation,  At surgeon's request and post-op pain management  Laterality: Right  Prep: chloraprep       Needles:  Injection technique: Single-shot  Needle Type: Echogenic Needle     Needle Length: 9cm  Needle Gauge: 21     Additional Needles:   Procedures:,,,, ultrasound used (permanent image in chart),,,,  Narrative:  Start time: 08/10/2017 7:30 AM End time: 08/10/2017 7:35 AM Injection made incrementally with aspirations every 5 mL.  Performed by: Personally  Anesthesiologist: Roberts Gaudy, MD  Additional Notes: 30 cc 0.5% bupivacaine with 1:200 epi and 0.3 mg clonidine injected easily

## 2017-08-10 NOTE — Progress Notes (Signed)
Assisted Dr. Joslin with right, ultrasound guided, pectoralis block. Side rails up, monitors on throughout procedure. See vital signs in flow sheet. Tolerated Procedure well. 

## 2017-08-10 NOTE — Anesthesia Procedure Notes (Signed)
Procedure Name: LMA Insertion Date/Time: 08/10/2017 8:11 AM Performed by: Gwyndolyn Saxon, CRNA Pre-anesthesia Checklist: Patient identified, Emergency Drugs available, Suction available, Patient being monitored and Timeout performed Patient Re-evaluated:Patient Re-evaluated prior to induction Oxygen Delivery Method: Circle system utilized Preoxygenation: Pre-oxygenation with 100% oxygen Induction Type: IV induction Ventilation: Mask ventilation without difficulty LMA: LMA inserted LMA Size: 4.0 Number of attempts: 1 Placement Confirmation: positive ETCO2,  CO2 detector and breath sounds checked- equal and bilateral Tube secured with: Tape Dental Injury: Teeth and Oropharynx as per pre-operative assessment

## 2017-08-10 NOTE — Op Note (Signed)
Patient Name:           Kathryn [name]   Date of Surgery:        08/10/2017  Pre op Diagnosis:      Multifocal cancer right breast                                      Unilateral axillary lymph node metastasis                                      Status post neoadjuvant chemotherapy  Post op Diagnosis:    Same  Procedure:                 Right breast lumpectomy x2 with radioactive seed localization x2                                      Reexcision superior skin margin                                      Complete right axillary lymph node dissection  Surgeon:                     Edsel Petrin. Dalbert Batman, M.D., FACS  Assistant:                      OR staff  Operative Indications:    This is a 57 year old female with cancer of the right breast, status post neoadjuvant chemotherapy. Dr. Lindi Adie and Dr. Sondra Come are involved in her care. Dr. Dennie Bible is her PCP.       Past history of bilateral breast biopsies for benign disease when she was young She presented with palpable mass right breast upper inner quadrant. She had felt this for 6 months. Imaging studies showed a 6 cm mass with calcifications in the right breast, 2 o'clock position and numerous other imaging findings A small density at the 12 o'clock position on the right was biopsied and showed fibroadenoma small densities at 6:00 and 8:00 proved to be benign cysts and there was an indeterminate density in the right breast at the 11 o'clock position which was ultimately biopsied and showed cancer.  A right axillary lymph node was biopsied which also confirmed metastatic cancer. 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 The residual mass was 2.4 cm. There has been improvement in the right axillary adenopathy but one lymph node remains enlarged. Left breast remains normal      Consensus recommendation at breast conference was modified radical mastectomy followed by radiation therapy and consideration for protocol  chemotherapy. Dr. Lindi Adie told her to leave the port in. Genetics has been offered twice and she has declined twice       I saw her two weeks ago and reiterated the recommendation of modified radical mastectomy. She declined and wanted to go home and think about it She returned  and stated that she has talked to numerous friends and his prayed about this and will not have a mastectomy. I have told her that this is inferior local control to the mastectomy but that lumpectomy and axillary node dissection would be better than no surgery at all. I told her that if she had multiple positive margins that I would recommend mastectomy.       I agreed to proceed with right breast lumpectomy with radioactive seed localization and axillary lymph node dissection to see if we could get her in the operating room and assess her disease. The plan is to place the seed in the primary tumor site, right breast, 1 o'clock position and a second seed in the secondary tumor site, 1 o'clock position.  These areas are about 4 or 5 cm apart.  Once again I offered a second surgical opinion and she declined. I discussed the indications, details, techniques, numerous risk of the surgery with her.  She agrees with this plan.    Operative Findings:       In the right breast and superiorly I could hear the radioactive seeds separately.  I planned a Supple 15 cm Kopp by 4 cm wide elliptical incision in the  superior breast somewhat curvilinear placed.  The tissues had some edema and did show chemotherapy effect.  The broad anterior margin of the resection is the skin.  The posterior margin is the pectoralis muscle.  The specimen mammogram looked very good and contained both seeds and marker clips in the center of the specimen.  It is unknown whether we will be able to achieve negative margins, however.  I reexcised a thin margin of skin superiorly to check that margin.  I performed a complete right axillary lymph node dissection, taking level 1 and level 2 lymph nodes.  There were no grossly enlarged lymph nodes in the right axilla.  Procedure in Detail:          2 radioactive seeds were placed at [facility] mammography yesterday.  They appear to be well placed.  The patient underwent right pectoral block by anesthesia.  She was taken to the operating room and underwent general anesthesia with LMA device.  The right breast and chest wall and axilla were then prepped and draped in a sterile fashion.  0.25% Marcaine with epinephrine was used as a local infiltration anesthetic.  Using the neoprobe I localized the 2 radioactive seeds.  These were slightly superior to the areolar margin.  I made a transverse elliptical incision after marking with a marking pen.  Lumpectomy was performed with generous excision using neoprobe and electrocautery all the way down to the pectoralis major muscle.  This was a generous specimen.  The specimen was removed and marked with silk sutures and a 6 color ink kit to orient the pathologist.  The specimen mammogram looked good as described above.  Hemostasis was excellent.  The wound was irrigated.  6 metal marker clips were placed in the walls of the lumpectomy cavity.        I then reexcised a 5 mm x 4 cm margin of the skin at the superior edge of the incision.  This  was sent as a separate specimen        the breast tissues were closed in layers with  2-0 Vicryl sutures, 3-0 Vicryl sutures,  and a running 4-0 Monocryl to close the skin and Dermabond.     Attention was directed to the right axilla.  A transverse incision was made at the hairline.  Dissection was carried down through the subcutaneous tissue, through the clavipectoral fascia into the axillary space.  The axillary contents were dissected off of the lateral margin of the pectoralis major and minor muscles.  Dissection was carried up to the axillary vein.  The tissues inferior to the axillary vein were cleaned off.  One large superficial tributary was controlled with metal metal clips and divided.  All level 1 and level 2 lymph nodes were removed.  The thoracodorsal nerve was identified and preserved.  The Safranek thoracic nerve was identified and preserved.  The wound was irrigated.  Hemostasis was excellent.  A 19 [place] Blake drain was placed up into the axillary space and brought out through a separate stab incision inferior laterally, sutured to the skin and connected to a suction bulb.  The clavipectoral fascia was closed with interrupted 3-0 Vicryl sutures and the skin closed with a running subcuticular 4-0 Monocryl and Dermabond.  The patient tolerated the procedure well and was taken to PACU in stable condition.  EBL 30 to 40 cc.  Counts correct.  Complications none.    Addendum: I logged onto the [employer] and reviewed her prescription medication history      Dhruva Orndoff M. Dalbert Batman, M.D., FACS General and Minimally Invasive Surgery Breast and Colorectal Surgery  08/10/2017 9:59 AM

## 2017-08-10 NOTE — Anesthesia Preprocedure Evaluation (Signed)
Anesthesia Evaluation  Patient identified by MRN, date of birth, ID band Patient awake    Reviewed: Allergy & Precautions, NPO status , Patient's Chart, lab work & pertinent test results  Airway Mallampati: II  TM Distance: >3 FB Neck ROM: Full    Dental  (+) Teeth Intact, Dental Advisory Given   Pulmonary    breath sounds clear to auscultation       Cardiovascular  Rhythm:Regular Rate:Normal     Neuro/Psych    GI/Hepatic   Endo/Other    Renal/GU      Musculoskeletal   Abdominal   Peds  Hematology   Anesthesia Other Findings   Reproductive/Obstetrics                             Anesthesia Physical Anesthesia Plan  ASA: II  Anesthesia Plan: General   Post-op Pain Management:    Induction:   PONV Risk Score and Plan: 1 and Ondansetron and Dexamethasone  Airway Management Planned: LMA  Additional Equipment:   Intra-op Plan:   Post-operative Plan: Extubation in OR  Informed Consent: I have reviewed the patients History and Physical, chart, labs and discussed the procedure including the risks, benefits and alternatives for the proposed anesthesia with the patient or authorized representative who has indicated his/her understanding and acceptance.   Dental advisory given  Plan Discussed with: CRNA and Anesthesiologist  Anesthesia Plan Comments:         Anesthesia Quick Evaluation

## 2017-08-11 ENCOUNTER — Encounter (HOSPITAL_BASED_OUTPATIENT_CLINIC_OR_DEPARTMENT_OTHER): Payer: Self-pay | Admitting: General Surgery

## 2017-08-11 DIAGNOSIS — C50211 Malignant neoplasm of upper-inner quadrant of right female breast: Secondary | ICD-10-CM | POA: Diagnosis not present

## 2017-08-11 MED ORDER — ALUM & MAG HYDROXIDE-SIMETH 200-200-20 MG/5ML PO SUSP
30.0000 mL | Freq: Four times a day (QID) | ORAL | Status: DC | PRN
  Administered 2017-08-11: 30 mL via ORAL
  Filled 2017-08-11: qty 30

## 2017-08-11 MED ORDER — HYDROCODONE-ACETAMINOPHEN 5-325 MG PO TABS: 1.0000 | ORAL_TABLET | Freq: Four times a day (QID) | ORAL | 0 refills | Status: DC | PRN

## 2017-08-11 NOTE — Discharge Summary (Addendum)
Patient ID: Kathryn [name] [id number] y.o. 17-Mar-1960  Admit date: 08/10/2017  Discharge date and time: 08/11/2017  Admitting Physician: Adin Hector   Discharge Physician: Adin Hector  Admission Diagnoses: RIGHT BREAST CANCER  Discharge Diagnoses: Cancer right breast, overlapping sites                                         Ipsilateral axillary lymph node metastasis                                         Status post neoadjuvant chemotherapy                                          History endometriosis                                          History of hysterectomy   Operations: Procedure(s): BREAST LUMPECTOMY WITH RADIOACTIVE SEED (X2)  AND AXILLARY LYMPH NODE DISSECTION  Admission Condition: good  Discharged Condition: good  Indication for Admission: This is a 57 year old female with cancer of the right breast, status post neoadjuvant chemotherapy. Dr. Lindi Adie and Dr. Sondra Come are involved in her care. Dr. Dennie Bible is her PCP.  Past history of bilateral breast biopsies for benign disease when she was young She presented with palpable mass right breast upper inner quadrant. She had felt this for 6 months. Imaging studies showed a 6 cm mass with calcifications in the right breast, 2 o'clock position and numerous other imaging findings A small density at the 12 o'clock position on the right was biopsied and showed fibroadenoma small densities at 6:00 and 8:00 proved to be benign cysts and there was an indeterminate density in the right breast at the 11 o'clock position which was ultimately biopsied and showed cancer.  A right axillary lymph node was biopsied which also confirmed metastatic cancer. 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 The residual mass was 2.4 cm. There has been improvement in the right axillary adenopathy but one lymph node remains enlarged. Left breast remains normal Consensus recommendation at breast conference was modified radical mastectomy followed by radiation therapy and consideration for protocol chemotherapy. Dr. Lindi Adie told her to leave the port in. Genetics has been offered twice and she has declined twice I saw her twoweeks ago and reiterated the recommendation of modified radical mastectomy. She declined and wanted to go home and think about it She returned  and stated that she has talked to numerous friends and his prayed about this and will not have a  mastectomy. I have told her that this is inferior local control to themastectomybut that lumpectomy and axillary node dissection would be better than no surgery at all. I told her that if she had multiple positive margins that I would recommend mastectomy. I agreed to proceed with right breast lumpectomy with radioactive seed localization and axillary lymph node dissection to see if we could get her in the operating room and assess her disease. The plan is to place the seed in the primary tumor site, right breast, 1 o'clock position and a second seed in the secondary tumor site, 1 o'clock position.  These areas are about 4 or 5 cm apart.  Once again I offered a second surgical opinion and she declined. I discussed the indications, details, techniques, numerous risk of the surgery with her.  She agrees with this plan.    Hospital Course: On the date day of admission the patient was taken to  the operating room and underwent extensive right breast lumpectomy x2 with radioactive seed localization x2.  Because of the potential skin involvement we took a curvilinear ellipse of skin on top of the tumor area.  Specimen mammogram looked good.  Margin assessment is pending at this time.  Complete axillary lymph node dissection, level 1 and level 2, was performed and a drain was placed. She was admitted overnight for observation and did very well.   Pain was well controlled.  No wound problems.  Was ambulatory.  Tolerating diet, without respiratory problems on postop day 1.  She felt ready to go home.  Physical exam showed that the lumpectomy incision and the axillary incision looks good.  No hematoma or seroma.  JP drainage in the right axilla was serosanguineous. Diet activities and wound care were discussed.  She was advised to use high-dose Tylenol for pain and I called in a prescription for Norco, 20 tablets, should she need a back-up.      I advised her to call the office and set up an appointment to see [place] next week.  I told her we could remove the drain when it was 10 cc a day or less.  Shoulder range of motion was encouraged.  Hopefully I can call the pathology report to her within 48 hours.  Consults: None  Significant Diagnostic Studies: Surgical pathology, pending  Treatments: surgery: Complete right axillary lymph node dissection, right breast lumpectomy x2 with RSL x2  Disposition: Home  Patient Instructions:  Allergies as of 08/11/2017   No Known Allergies     Medication List    TAKE these medications   ACAI+SUPERFRUIT/GREEN TEA Tabs Take 1 tablet by mouth daily.   cholecalciferol 1000 units tablet Commonly known as:  VITAMIN D Take 1,000 Units by mouth daily.   clonazePAM 0.5 MG tablet Commonly known as:  KLONOPIN Take 1 tablet (0.5 mg total) by mouth 3 (three) times daily as needed for anxiety.   cyclobenzaprine 10 MG tablet Commonly known as:  FLEXERIL Take 10  mg by mouth 3 (three) times daily as needed for muscle spasms.   HYDROcodone-acetaminophen 5-325 MG tablet Commonly known as:  NORCO Take 1-2 tablets by mouth every 6 (six) hours as needed for moderate pain or severe pain.   lidocaine-prilocaine cream Commonly known as:  EMLA Apply to affected area once What changed:    how much to take  how to take this  when to take this  reasons to take this  additional instructions   LORazepam 0.5 MG tablet Commonly known  as:  ATIVAN Take 1 tablet (0.5 mg total) by mouth at bedtime as needed (Nausea or vomiting).   magic mouthwash w/lidocaine Soln Take 5 mLs by mouth 2 (two) times daily as needed for mouth pain.   multivitamin with minerals Tabs tablet Take 1 tablet by mouth daily. Plus vit C '500mg'$    naproxen 500 MG tablet Commonly known as:  NAPROSYN Take 500 mg by mouth every 12 (twelve) hours as needed.   ondansetron 8 MG tablet Commonly known as:  ZOFRAN Take 1 tablet (8 mg total) by mouth 2 (two) times daily as needed. Start on the third day after chemotherapy.   potassium chloride SA 20 MEQ tablet Commonly known as:  K-DUR,KLOR-CON Take 1 tablet (20 mEq total) by mouth 2 (two) times daily.   prochlorperazine 10 MG tablet Commonly known as:  COMPAZINE Take 1 tablet (10 mg total) by mouth every 6 (six) hours as needed (Nausea or vomiting).   vitamin C 250 MG tablet Commonly known as:  ASCORBIC ACID Take 500 mg by mouth daily.            Discharge Care Instructions  (From admission, onward)        Start     Ordered   08/11/17 0000  Discharge wound care:    Comments:  You may change the dressing when necessary. Dry gauze bandage and then cover with the elastic Velcro binder  Keep a written record of the drainage. Bring that written record to the office with you with each office visit  Be sure to move your right shoulder around frequently to avoid stiffness   08/11/17 0551      Activity: No driving for 1  week.  No sports or heavy lifting for 3 weeks.  Range of motion exercises right shoulder encouraged.  Frequent ambulation out-of-doors encouraged Diet: low fat, low cholesterol diet Wound Care: as directed  Follow-up:  With Dr. Dalbert Batman in 1 weeks     addendum: I logged onto the [facility] website and reviewed her prescription medication history     Signed: Edsel Petrin. Dalbert Batman, M.D., FACS General and minimally invasive surgery Breast and Colorectal Surgery  08/11/2017, 5:52 AM

## 2017-08-11 NOTE — Discharge Instructions (Signed)
About my Jackson-Pratt Bulb Drain  What is a Jackson-Pratt bulb? A Jackson-Pratt is a soft, round device used to collect drainage. It is connected to a Thalmann, thin drainage catheter, which is held in place by one or two small stiches near your surgical incision site. When the bulb is squeezed, it forms a vacuum, forcing the drainage to empty into the bulb.  Emptying the Jackson-Pratt bulb- To empty the bulb: 1. Release the plug on the top of the bulb. 2. Pour the bulb's contents into a measuring container which your nurse will provide. 3. Record the time emptied and amount of drainage. Empty the drain(s) as often as your     doctor or nurse recommends.       [place] Office Phone Number [phone]       BREAST BIOPSY/ PARTIAL MASTECTOMY: POST OP INSTRUCTIONS  Always review your discharge instruction sheet given to you by the facility where your surgery was performed.  IF YOU HAVE DISABILITY OR FAMILY LEAVE FORMS, YOU MUST BRING THEM TO THE OFFICE FOR PROCESSING.  DO NOT GIVE THEM TO YOUR DOCTOR.  1. A prescription for pain medication may be given to you upon discharge.  Take your pain medication as prescribed, if needed.  If narcotic pain medicine is not needed, then you may take acetaminophen (Tylenol) or ibuprofen (Advil) as needed. 2. Take your usually prescribed medications unless otherwise directed 3. If you need a refill on your pain medication, please contact your pharmacy.  They will contact our office to request authorization.  Prescriptions will not be filled after 5pm or on week-ends. 4. You should eat very light the first 24 hours after surgery, such as soup, crackers, pudding, etc.  Resume your normal diet the day after surgery. 5. Most patients will experience some swelling and bruising in the breast.  Ice packs and a good support bra will help.  Swelling and bruising can take several days to resolve.  6. It is common to experience some  constipation if taking pain medication after surgery.  Increasing fluid intake and taking a stool softener will usually help or prevent this problem from occurring.  A mild laxative (Milk of Magnesia or Miralax) should be taken according to package directions if there are no bowel movements after 48 hours. 7. Unless discharge instructions indicate otherwise, you may remove your bandages 24-48 hours after surgery, and you may shower at that time.  You may have steri-strips (small skin tapes) in place directly over the incision.  These strips should be left on the skin for 7-10 days.  If your surgeon used skin glue on the incision, you may shower in 24 hours.  The glue will flake off over the next 2-3 weeks.  Any sutures or staples will be removed at the office during your follow-up visit. 8. ACTIVITIES:  You may resume regular daily activities (gradually increasing) beginning the next day.  Wearing a good support bra or sports bra minimizes pain and swelling.  You may have sexual intercourse when it is comfortable. a. You may drive when you no longer are taking prescription pain medication, you can comfortably wear a seatbelt, and you can safely maneuver your car and apply brakes. b. RETURN TO WORK:  ______________________________________________________________________________________ 9. You should see your doctor in the office for a follow-up appointment approximately two weeks after your surgery.  Your doctors nurse will typically make your follow-up appointment when she calls you with your pathology report.  Expect your pathology report 2-3  business days after your surgery.  You may call to check if you do not hear from [place] after three days. 10. OTHER INSTRUCTIONS: _______________________________________________________________________________________________  _____________________________________________________________________________________________________________________________________ _____________________________________________________________________________________________________________________________________ _____________________________________________________________________________________________________________________________________  WHEN TO CALL YOUR DOCTOR: 1. Fever over 101.0 2. Nausea and/or vomiting. 3. Extreme swelling or bruising. 4. Continued bleeding from incision. 5. Increased pain, redness, or drainage from the incision.  The clinic staff is available to answer your questions during regular business hours.  Please dont hesitate to call and ask to speak to one of the nurses for clinical concerns.  If you have a medical emergency, go to the nearest emergency room or call 911.  A surgeon from [facility] is always on call at the hospital.  For further questions, please visit [email or web address]   Date                  Time                    Amount (Drain 1)                 Amount (Drain 2)  _____________________________________________________________________  _____________________________________________________________________  _____________________________________________________________________  _____________________________________________________________________  _____________________________________________________________________  _____________________________________________________________________  _____________________________________________________________________  _____________________________________________________________________  Squeezing the Jackson-Pratt Bulb- To squeeze the bulb: 1. Make sure the plug at the top of the bulb is open. 2. Squeeze the bulb tightly in your fist. You will hear air squeezing from the bulb. 3. Replace the plug while the bulb is  squeezed. 4. Use a safety pin to attach the bulb to your clothing. This will keep the catheter from     pulling at the bulb insertion site.  When to call your doctor- Call your doctor if:  Drain site becomes red, swollen or hot.  You have a fever greater than 101 degrees F.  There is oozing at the drain site.  Drain falls out (apply a guaze bandage over the drain hole and secure it with tape).  Drainage increases daily not related to activity patterns. (You will usually have more drainage when you are active than when you are resting.)  Drainage has a bad odor.

## 2017-08-16 ENCOUNTER — Ambulatory Visit: Payer: Medicaid Other | Admitting: Hematology and Oncology

## 2017-08-17 ENCOUNTER — Inpatient Hospital Stay: Payer: Medicaid Other | Attending: Radiation Oncology | Admitting: Hematology and Oncology

## 2017-08-17 ENCOUNTER — Telehealth: Payer: Self-pay | Admitting: *Deleted

## 2017-08-17 ENCOUNTER — Encounter: Payer: Self-pay | Admitting: *Deleted

## 2017-08-17 DIAGNOSIS — Z9221 Personal history of antineoplastic chemotherapy: Secondary | ICD-10-CM | POA: Diagnosis not present

## 2017-08-17 DIAGNOSIS — Z171 Estrogen receptor negative status [ER-]: Secondary | ICD-10-CM | POA: Insufficient documentation

## 2017-08-17 DIAGNOSIS — C50411 Malignant neoplasm of upper-outer quadrant of right female breast: Secondary | ICD-10-CM | POA: Diagnosis not present

## 2017-08-17 MED ORDER — CAPECITABINE 500 MG PO TABS: 625.0000 mg/m2 | ORAL_TABLET | Freq: Two times a day (BID) | ORAL | 0 refills | Status: DC

## 2017-08-17 NOTE — Assessment & Plan Note (Signed)
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 T3N0 stage IIIb  01/19/2017:Right breast 10 x 8.3 x 6.9 cm area of cancer involving all 4 quadrants with skin involvement extends from nipple to pectoralis muscle and involving right axillary lymph nodes, right internal mammary nodes  01/22/2017: CT CAP: 1.9 cm dome of the right hepatic lobe suspicious for liver metastases, 9 mm groundglass nodule in the right lung apex could be inflammation versus cancer Liver MRI: Lesion is benign involuted cyst  Treatment plan: 1.Neoadj chemo with dose dense AC foll by Taxoland carboplatin 01/28/17- 06/24/17 2. Mastectomy 3. XRT 4.Consideration for participation inSWOG [phone] clinical trial -------------------------------------------------------------------------------------------------------------------------------------------------- Pathology counseling: I discussed the final pathology report of the patient provided  a copy of this report. I discussed the margins as well as lymph node surgeries. We also discussed the final staging along with previously performed ER/PR and HER-2/neu testing.  Recommendation: 1.  Adjuvant Xeloda with radiation 2. Followed by participation in SWOG S 2018 clinical trial with pembrolizumab adjuvant therapy  CREATE-X study enrolled 910 patient's with triple negative breast cancer and residual disease after neo-adjuvant therapy. 455 patients assigned to 1250 mg/m times daily 2 weeks on 1 week off  up to 8 cycles, at 5 years PFS 74.1% with Xeloda  compared to 67.7% in control arm, 30% reduction in risk, overall survival 89.2% versus 83.9%, hand-foot syndrome was seen in 72.3% with Xeloda grade 3 in 10.9%  Return to clinic during radiation therapy for lab check on Xeloda.

## 2017-08-17 NOTE — Telephone Encounter (Signed)
Left message with appt with Dr. Iran Planas for 8/28 at 4pm.

## 2017-08-17 NOTE — Progress Notes (Signed)
Patient Care Team: [name], MD (Inactive) as PCP - General (Family Medicine)  DIAGNOSIS:  Encounter Diagnosis  Name Primary?  . 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  T3N2 stage III      01/19/2017 Breast MRI    Right breast 10 x 8.3 x 6.9 cm area of cancer involving all 4 quadrants with skin involvement extends from nipple to pectoralis muscle and involving right axillary lymph nodes, right internal mammary nodes      01/22/2017 Imaging    CT chest abdomen pelvis reveals a 6 cm right breast mass, mild right axillary lymphadenopathy, 1.9 cm peripheral enhancing lesion in the dome of the right hepatic lobe, 9 mm groundglass nodule right lung apex      01/28/2017 - 06/24/2017 Neo-Adjuvant Chemotherapy    Dose dense Adriamycin and Cytoxan  followed with Taxol and carboplatin      08/11/2017 Surgery    Right lumpectomy: IDC, grade 3, 2.9 cm, margins negative, isolated tumor cells and 2/19 lymph nodes, ER 0%, PR 0%, HER-2 negative ratio 1.1. ypT2N0 (I+)       CHIEF COMPLIANT: Follow-up after recent right lumpectomy  INTERVAL HISTORY: Kathryn [name] is a 57 year old with above-mentioned history of triple negative right breast cancer underwent neoadjuvant chemotherapy and subsequent underwent lumpectomy.  She is healing recovering very well from the recent surgery.  She has a drain in place.  She is here today to discuss pathology report.  REVIEW OF  SYSTEMS:   Constitutional: Denies fevers, chills or abnormal weight loss Eyes: Denies blurriness of vision Ears, nose, mouth, throat, and face: Denies mucositis or sore throat Respiratory: Denies cough, dyspnea or wheezes Cardiovascular: Denies palpitation, chest discomfort Gastrointestinal:  Denies nausea, heartburn or change in bowel habits Skin: Denies abnormal skin rashes Lymphatics: Denies new lymphadenopathy or easy bruising Neurological:Denies numbness, tingling or new weaknesses Behavioral/Psych: Mood is stable, no new changes  Extremities: No lower extremity edema Breast: Recent right lumpectomy All other systems were reviewed with the patient and are negative.  I have reviewed the past medical history, past surgical history, social history and family history with the patient and they are unchanged from previous note.  ALLERGIES:  has No Known Allergies.  MEDICATIONS:  Current Outpatient Medications  Medication Sig Dispense Refill  . capecitabine (XELODA) 500 MG tablet Take 2 tablets (1,000 mg total) by mouth 2 (two) times daily after a meal. 120 tablet 0  . cholecalciferol (VITAMIN D) 1000 UNITS tablet Take 1,000 Units by mouth daily.    . clonazePAM (KLONOPIN) 0.5 MG tablet Take 1 tablet (0.5 mg total) by mouth 3 (three) times daily as needed for anxiety. 90 tablet 5  . cyclobenzaprine (FLEXERIL) 10 MG tablet Take 10 mg by mouth 3 (three) times daily as needed for muscle spasms.    Marland Kitchen HYDROcodone-acetaminophen (NORCO) 5-325 MG tablet Take 1-2 tablets by mouth every 6 (six) hours as needed for moderate pain or severe pain. 20 tablet 0  . lidocaine-prilocaine (EMLA)  cream Apply to affected area once (Patient taking differently: Apply 1 application topically 3 times/day as needed-between meals & bedtime. Apply to affected area once) 30 g 3  . Misc Natural Products (ACAI+SUPERFRUIT/GREEN TEA) TABS Take 1 tablet by mouth daily.    . Multiple Vitamin (MULITIVITAMIN WITH MINERALS)  TABS Take 1 tablet by mouth daily. Plus vit C '500mg'$     . potassium chloride SA (K-DUR,KLOR-CON) 20 MEQ tablet Take 1 tablet (20 mEq total) by mouth 2 (two) times daily. (Patient not taking: Reported on 06/16/2017) 30 tablet 2  . vitamin C (ASCORBIC ACID) 250 MG tablet Take 500 mg by mouth daily.      No current facility-administered medications for this visit.     PHYSICAL EXAMINATION: ECOG PERFORMANCE STATUS: 1 - Symptomatic but completely ambulatory  Vitals:   08/17/17 1051  BP: 126/90  Pulse: (!) 113  Resp: 18  Temp: 98.6 F (37 C)  SpO2: 100%   Filed Weights   08/17/17 1051  Weight: 150 lb 8 oz (68.3 kg)    GENERAL:alert, no distress and comfortable SKIN: skin color, texture, turgor are normal, no rashes or significant lesions EYES: normal, Conjunctiva are pink and non-injected, sclera clear OROPHARYNX:no exudate, no erythema and lips, buccal mucosa, and tongue normal  NECK: supple, thyroid normal size, non-tender, without nodularity LYMPH:  no palpable lymphadenopathy in the cervical, axillary or inguinal LUNGS: clear to auscultation and percussion with normal breathing effort HEART: regular rate & rhythm and no murmurs and no lower extremity edema ABDOMEN:abdomen soft, non-tender and normal bowel sounds MUSCULOSKELETAL:no cyanosis of digits and no clubbing  NEURO: alert & oriented x 3 with fluent speech, no focal motor/sensory deficits EXTREMITIES: No lower extremity edema   LABORATORY DATA:  I have reviewed the data as listed CMP Latest Ref Rng & Units 06/24/2017 06/18/2017 06/17/2017  Glucose 70 - 140 mg/dL 115 153(H) 160(H)  BUN 7 - 26 mg/dL '10 7 10  '$ Creatinine 0.60 - 1.10 mg/dL 0.74 0.80 0.82  Sodium 136 - 145 mmol/L 140 140 139  Potassium 3.5 - 5.1 mmol/L 3.4(L) 3.1(L) 3.1(L)  Chloride 98 - 109 mmol/L 105 102 100  CO2 22 - 29 mmol/L '25 24 25  '$ Calcium 8.4 - 10.4 mg/dL 8.8 9.4 9.3  Total Protein 6.4 - 8.3 g/dL 6.4 6.6 6.8  Total Bilirubin 0.2 - 1.2 mg/dL 0.3 0.3 0.3   Alkaline Phos 40 - 150 U/L 99 98 98  AST 5 - 34 U/L '22 21 23  '$ ALT 0 - 55 U/L '24 23 23    '$ Lab Results  Component Value Date   WBC 4.1 06/24/2017   HGB 9.1 (L) 06/24/2017   HCT 27.8 (L) 06/24/2017   MCV 105.7 (H) 06/24/2017   PLT 119 (L) 06/24/2017   NEUTROABS 3.2 06/24/2017    ASSESSMENT & PLAN:  Malignant neoplasm of upper-outer quadrant of right breast in female, estrogen receptor negative (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 T3N0 stage IIIb  01/19/2017:Right breast 10 x 8.3 x 6.9 cm area of cancer involving all 4 quadrants  with skin involvement extends from nipple to pectoralis muscle and involving right axillary lymph nodes, right internal mammary nodes  01/22/2017: CT CAP: 1.9 cm dome of the right hepatic lobe suspicious for liver metastases, 9 mm groundglass nodule in the right lung apex could be inflammation versus cancer Liver MRI: Lesion is benign involuted cyst  Treatment plan: 1.Neoadj chemo with dose dense AC foll by Taxoland carboplatin 01/28/17- 06/24/17 2. right lumpectomy 08/17/2017 3. XRT 4.Consideration for participation inSWOG [id number] clinical trial -------------------------------------------------------------------------------------------------------------------------------------------------- Pathology counseling: I discussed the final pathology report of the patient provided  a copy of this report. I discussed the margins as well as lymph node surgeries. We also discussed the final staging along with previously  performed ER/PR and HER-2/neu testing.  Recommendation: 1.  Adjuvant Xeloda with radiation 2. Followed by participation in SWOG S 2018 clinical trial with pembrolizumab adjuvant therapy  CREATE-X study enrolled 910 patient's with triple negative breast cancer and residual disease after neo-adjuvant therapy. 455 patients assigned to 1250 mg/m times daily 2 weeks on 1 week off  up to 8 cycles, at 5 years PFS 74.1% with Xeloda compared to 67.7% in control arm, 30% reduction in risk, overall survival 89.2% versus 83.9%, hand-foot syndrome was seen in 72.3% with Xeloda grade 3 in 10.9%  Return to clinic during radiation therapy for lab check on Xeloda.     No orders of the defined types were placed in this encounter.  The patient has a good understanding of the overall plan. she agrees with it. she will call with any problems that may develop before the next visit here.   Harriette Ohara, MD 08/17/17

## 2017-08-19 NOTE — Progress Notes (Signed)
Location of Breast Cancer: :Right breast 10 x 8.3 x 6.9 cm area of cancer involving all 4 quadrants with skin involvement extends from nipple to pectoralis muscle and involving right axillary lymph nodes, right internal mammary nodes  Histology per Pathology Report: 08/10/17:  1. Breast, lumpectomy, Right - INVASIVE DUCTAL CARCINOMA, GRADE 3, SPANNING 2.9 CM. - RESECTION MARGINS ARE NEGATIVE. - BIOPSY SITES. - FIBROADENOMA, FIBROADENOMATOID CHANGE, FIBROCYSTIC CHANGE, PSEUDOANGIOMATOUS STROMAL HYPERPLASIA. - SEE ONCOLOGY TABLE. 2. Breast, excision, Right superior skin margin - BENIGN SKIN AND BREAST TISSUE. 3. Lymph nodes, regional resection, Right Axillary - ISOLATED TUMOR CELLS IN TWO OF NINETEEN LYMPH NODES (0/19).  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  T3N2 stage III    Past/Anticipated interventions by surgeon, if any: 08/10/17: Procedure:                 Right breast lumpectomy x2 with radioactive seed localization x2                                      Reexcision superior skin margin                                      Complete right axillary lymph node dissection  Surgeon:                     Edsel Petrin. Dalbert Batman, M.D., [facility]  Past/Anticipated interventions by medical oncology, if any: Chemotherapy   01/28/2017 - 06/24/2017 Neo-Adjuvant Chemotherapy    Dose dense Adriamycin and Cytoxan  followed with Taxol and carboplatin     Lymphedema issues, if any:  No  Pain issues, if any:  RIGHT arm is tight and eases with gentle  movement. Rare pains in RIGHT breast.   SAFETY ISSUES:  Prior radiation? No  Pacemaker/ICD? No  Possible current pregnancy? No, post-surgical  Is the patient on methotrexate? No  Current Complaints / other details:  Pt presents for Radiation Oncology consult with Dr. Sondra Come. Pt still has drain in place in RIGHT breast. Pt is appropriately anxious. Pt is unaccompanied.     Loma Sousa, RN 09/01/2017,1:26 PM

## 2017-08-20 ENCOUNTER — Telehealth: Payer: Self-pay | Admitting: Pharmacist

## 2017-08-20 DIAGNOSIS — C50411 Malignant neoplasm of upper-outer quadrant of right female breast: Secondary | ICD-10-CM

## 2017-08-20 DIAGNOSIS — Z171 Estrogen receptor negative status [ER-]: Principal | ICD-10-CM

## 2017-08-20 MED ORDER — CAPECITABINE 500 MG PO TABS: ORAL_TABLET | ORAL | 0 refills | Status: DC

## 2017-08-20 NOTE — Telephone Encounter (Signed)
Oral Oncology Pharmacist Encounter  Received new prescription for Xeloda (capecitabine) for the adjuvant treatment of stage III triple negative breast cancer in conjunction with radiation, planned duration 6 weeks of treatment.  Labs from 06/24/17 assessed, OK for treatment. Noted mild thrombocytopenia, CBC will be closely monitored  MD plans low dose Xeloda at ~575 mg/m2 BID on radiation days only  Current medication list in Epic reviewed, no DDIs with Xeloda identified.  Prescription has been e-scribed to the [facility] for benefits analysis and approval.  Oral Oncology Clinic will continue to follow for insurance authorization, copayment issues, initial counseling and start date.  Johny Drilling, PharmD, BCPS, BCOP  08/20/2017 1:41 PM Oral Oncology Clinic [phone]

## 2017-08-20 NOTE — Telephone Encounter (Signed)
Oral Chemotherapy Pharmacist Encounter   Attempted to reach patient to provide update and offer for initial counseling on oral medication: Xeloda.  No answer. Left VM for patient to call back for Rx processing.   Johny Drilling, PharmD, BCPS, BCOP  08/20/2017   2:01 PM Oral Oncology Clinic [phone]

## 2017-08-30 NOTE — Telephone Encounter (Signed)
Oral Chemotherapy Pharmacist Encounter   Attempted to reach patient to provide update and offer for initial counseling on oral medication: Xeloda.  No answer. Left VM for patient to call back for Rx processing.   Johny Drilling, PharmD, BCPS, BCOP  08/30/2017  9:54 AM  Oral Oncology Clinic [phone]

## 2017-08-31 NOTE — Telephone Encounter (Signed)
Oral Chemotherapy Pharmacist Encounter   I spoke with patient for overview of: Xeloda for the adjuvant treatment of stage III triple negative breast cancer in conjunction with radiation, planned duration 5 1/2 - 6 weeks.  Radiation course treatment length and schedule will be determined after radiation consult appointment and CT simulation appointment.  Counseled patient on administration, dosing, side effects, monitoring, drug-food interactions, safe handling, storage, and disposal.  Patient will take Xeloda 500mg  tablets, 2 tablets (1000 mg) by mouth in AM and 2 tabs (1000 mg) by mouth in PM, within 30 minutes of finishing meals, on days of radiation only.  Xeloda and radiation start date: TBD, likely week of 09/06/2017  Adverse effects of Xeloda include but are not limited to: fatigue, decreased blood counts, GI upset, diarrhea, and hand-foot syndrome.  Patient has anti-emetic on hand and knows to take it if nausea develops.   Patient will obtain anti diarrheal and alert the office of 4 or more loose stools above baseline.   Reviewed with patient importance of keeping a medication schedule and plan for any missed doses.  Patient with multiple questions about immunotherapy phase of treatment course. Patient instructed she will receive extensive counseling on this medication if she is still agreeable to clinical trial involvement at the completion of her radiation course.  Kathryn [name] voiced understanding and appreciation.   All questions answered.  Medication reconciliation performed and medication/allergy list updated.  First fill of Xeloda will be made ready for pickup at the [facility] tomorrow (09/01/2017) for copayment $3 with active Medicaid prescription insurance coverage. Patient informed that remaining course of Xeloda will be coordinated with dispensing pharmacy 1 week prior to needing remainder of Xeloda.  Patient knows to call the office with questions or  concerns. Oral Oncology Clinic will continue to follow.  Thank you,  Kathryn [name], PharmD, BCPS, BCOP  08/31/2017  10:16 AM Oral Oncology Clinic [phone]

## 2017-09-01 ENCOUNTER — Ambulatory Visit
Admission: RE | Admit: 2017-09-01 | Discharge: 2017-09-01 | Disposition: A | Discharge status: Home / Self Care | Payer: Medicaid Other | Source: Ambulatory Visit | Attending: Radiation Oncology | Admitting: Radiation Oncology

## 2017-09-01 ENCOUNTER — Other Ambulatory Visit: Payer: Self-pay

## 2017-09-01 ENCOUNTER — Encounter: Payer: Self-pay | Admitting: Radiation Oncology

## 2017-09-01 VITALS — BP 133/83 | HR 118 | Temp 98.0°F | Resp 17 | Wt 151.4 lb

## 2017-09-01 DIAGNOSIS — Z9221 Personal history of antineoplastic chemotherapy: Secondary | ICD-10-CM | POA: Insufficient documentation

## 2017-09-01 DIAGNOSIS — Z171 Estrogen receptor negative status [ER-]: Secondary | ICD-10-CM | POA: Insufficient documentation

## 2017-09-01 DIAGNOSIS — C50411 Malignant neoplasm of upper-outer quadrant of right female breast: Secondary | ICD-10-CM | POA: Insufficient documentation

## 2017-09-01 DIAGNOSIS — Z79899 Other long term (current) drug therapy: Secondary | ICD-10-CM | POA: Insufficient documentation

## 2017-09-01 MED FILL — CAPECITABINE 500 MG TABLET: 500 | 30 days supply | Qty: 88 | Fill #0

## 2017-09-01 NOTE — Progress Notes (Signed)
Radiation Oncology         [phone] ________________________________  Name: Kathryn [name] MRN: [id number]  Date: 09/01/2017  DOB: Oct 16, 1960  Re-Evaluation Visit Note  CC: Kathryn Hippo, MD (Inactive)  Nicholas Lose, MD    ICD-10-CM   1. Malignant neoplasm of upper-outer quadrant of right breast in female, estrogen receptor negative ([place]) C50.411    Z17.1     Diagnosis:   Clinical stage IIIc, cT3N0, grade 3, triple negative invasive ductal carcinoma of the right breast   Pathologic stage: ypT2, ypN0(i+)  Narrative:  The patient returns today for routine follow-up. She is doing well overall and she notes that her chemotherapy went well. She noted that she had mild fatigue and constipation with chemotherapy, but denies diarrhea, nausea, or vomiting. She denies pain to her breast following surgery, however, she has numbness to her surgical site. She has followed up with her surgeon recently and she has a drainage tube in and her surgeon wants her down to draining 15 cc's a day.    Since they were last seen in the office, they had sentinel node biopsy completed on 01/22/2017 that showed: Lymph node, needle/core biopsy with metastatic carcinoma in one lymph node. Breast, right, needle core biopsy with carcinoma within lymphovascular spaces.   On 08/10/2017, she underwent right breast lumpectomy with results showing: Breast, lumpectomy, Right with invasive ductal carcinoma, grade 3, spanning 2.9 cm. Resection margins are negative. Biopsy sites. Fibroadenoma, fibroadenomatoid change, fibrocystic change, pseudoangiomatous stromal hyperplasia. Breast, excision, Right superior skin margin benign skin and breast tissue. Lymph nodes, regional resection, Right Axillary with isolated tumor cells in two of nineteen lymph nodes (0/19). Prognostic indicators significant for: ER, 0% negative and PR, 0% negative. HER2 negative.    Malignant neoplasm of upper-outer quadrant of right breast in female,  estrogen receptor negative ([place])   12/29/2016 Initial Diagnosis    Palpable right breast lump for 6 months, mammogram revealed 6 cm mass at 1 o'clock position 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  T3N2 stage III    01/19/2017 Breast MRI    Right breast 10 x 8.3 x 6.9 cm area of cancer involving all 4 quadrants with skin involvement extends from nipple to pectoralis muscle and involving right axillary lymph nodes, right internal mammary nodes    01/22/2017 Imaging    CT chest abdomen pelvis reveals a 6 cm right breast mass, mild right axillary lymphadenopathy, 1.9 cm peripheral enhancing lesion in the dome of the right hepatic lobe, 9 mm groundglass nodule right lung apex    01/28/2017 - 06/24/2017 Neo-Adjuvant Chemotherapy    Dose dense Adriamycin and Cytoxan  followed with Taxol and carboplatin    08/11/2017 Surgery    Right lumpectomy: IDC, grade 3, 2.9 cm, margins negative, isolated tumor cells and 2/19 lymph nodes, ER 0%, PR 0%, HER-2 negative ratio 1.1. ypT2N0 (I+)    08/25/2017 Cancer Staging    Staging form: Breast, AJCC 8th Edition - Pathologic: No Stage Recommended (ypT2, pN0(i+), cM0, G3, ER-, PR-, HER2-) - Signed by Gardenia Phlegm, NP on 08/25/2017           On review of systems, she reports numbness to surgical site and mild irritation at the site of draining tube. she denies any other symptoms. Pertinent positives are listed and detailed within the above HPI.  ALLERGIES:  has No Known Allergies.  Meds: Current Outpatient Medications  Medication Sig Dispense Refill  . capecitabine (XELODA) 500 MG tablet Take 2 tablets (1070m) by mouth 2 times daily, within 30 min after finishing food. Take on radiation days only, M-F 120 tablet 0  . cholecalciferol (VITAMIN D) 1000 UNITS tablet Take 1,000  Units by mouth daily.    . clonazePAM (KLONOPIN) 0.5 MG tablet Take 1 tablet (0.5 mg total) by mouth 3 (three) times daily as needed for anxiety. 90 tablet 5  . cyclobenzaprine (FLEXERIL) 10 MG tablet Take 10 mg by mouth 3 (three) times daily as needed for muscle spasms.    .Marland KitchenHYDROcodone-acetaminophen (NORCO) 5-325 MG tablet Take 1-2 tablets by mouth every 6 (six) hours as needed for moderate pain or severe pain. 20 tablet 0  . lidocaine-prilocaine (EMLA) cream Apply to affected area once (Patient taking differently: Apply 1 application topically 3 times/day as needed-between meals & bedtime. Apply to affected area once) 30 g 3  . Misc Natural Products (ACAI+SUPERFRUIT/GREEN TEA) TABS Take 1 tablet by mouth daily.    . Multiple Vitamin (MULITIVITAMIN WITH MINERALS) TABS Take 1 tablet by mouth daily. Plus vit C 5022m   . vitamin C (ASCORBIC ACID) 250 MG tablet Take 500 mg by mouth daily.     . potassium chloride SA (K-DUR,KLOR-CON) 20 MEQ tablet Take 1 tablet (20 mEq total) by mouth 2 (two) times daily. (Patient not taking: Reported on 06/16/2017) 30 tablet 2   No current facility-administered medications for this encounter.     Physical Findings: The patient is in no acute distress. Patient is alert and oriented.  weight is 151 lb 6.4 oz (68.7 kg). Her axillary temperature is 98 F (36.7 C). Her blood pressure is 133/83 and her pulse is 118 (abnormal). Her respiration is 17 and oxygen saturation is 100%.    Lungs are clear to auscultation bilaterally. Heart has regular rate and rhythm. No palpable cervical, supraclavicular, or axillary adenopathy. Abdomen soft, non-tender, normal bowel sounds.  Breast: Right breast with JP drain in place along the lateral aspect of the breast. Mild erythema to the entrance site of the JP drain. Pt has a Hughett scar in the upper aspect of the right breast with some architectural distortion noted. Second scar in the axillary region from her node dissection. No signs of  drainage from either scar.     Lab Findings: Lab Results  Component Value Date   WBC 4.1 06/24/2017   HGB 9.1 (L) 06/24/2017   HCT 27.8 (L) 06/24/2017   MCV 105.7 (H) 06/24/2017   PLT 119 (L) 06/24/2017    Radiographic Findings: No results found.  Impression: Pathologic stage: ypT2, ypN0(i+), grade 3, triple negative, Invasive ductal carcinoma of right breast  Patient will be a good candidate for adjuvant radiation therapy. In light of her positive lymph node prior to neoadjuvant chemotherapy, I would recommend elective coverage of the high axilla/supraclavicular region with her breast radiation treatments. She doesn't need coverage of full axilla given her axillary node dissection.   Today, I talked to the patient  about the findings and work-up thus far.  We discussed the natural history of right breast cancer and general treatment, highlighting the role of radiotherapy in the management.  We discussed the available radiation techniques, and focused on the details of logistics and delivery.  We reviewed the anticipated acute and late sequelae associated with radiation in this setting.  The patient was encouraged to ask questions  that I answered to the best of my ability.  A patient consent form was discussed and signed.  We retained a copy for our records.  The patient would like to proceed with radiation and will be scheduled for CT simulation.   Plan:  Patient will be set up for CT simulation approximately 5 weeks out from her surgery, but may need to delay this if she continues to have a JP drain in place. Patient is at high risk for recurrence and will receive radiosensitizing Xeloda chemotherapy during her breast radiation treatment. I anticipate 6.5 weeks of radiation therapy.   ____________________________________   [name], PhD, MD    This document serves as a record of services personally performed by Gery Pray, MD. It was created on his behalf by [facility], a  trained medical scribe. The creation of this record is based on the scribe's personal observations and the provider's statements to them. This document has been checked and approved by the attending provider.

## 2017-09-09 NOTE — Telephone Encounter (Signed)
Oral Oncology Patient Advocate Encounter  Confirmed with Williamson that Xeloda was picked up 09/08/17.  [place] Patient [place] Phone [phone] Fax [phone]

## 2017-09-15 ENCOUNTER — Ambulatory Visit
Admission: RE | Admit: 2017-09-15 | Discharge: 2017-09-15 | Disposition: A | Discharge status: Home / Self Care | Payer: Medicaid Other | Source: Ambulatory Visit | Attending: Radiation Oncology | Admitting: Radiation Oncology

## 2017-09-15 DIAGNOSIS — Z171 Estrogen receptor negative status [ER-]: Secondary | ICD-10-CM | POA: Diagnosis not present

## 2017-09-15 DIAGNOSIS — C50911 Malignant neoplasm of unspecified site of right female breast: Secondary | ICD-10-CM | POA: Insufficient documentation

## 2017-09-15 DIAGNOSIS — C50411 Malignant neoplasm of upper-outer quadrant of right female breast: Secondary | ICD-10-CM

## 2017-09-15 DIAGNOSIS — Z510 Encounter for antineoplastic radiation therapy: Secondary | ICD-10-CM | POA: Diagnosis not present

## 2017-09-15 NOTE — Progress Notes (Signed)
  Radiation Oncology         [phone]) [phone] ________________________________  Name: Kathryn [name] MRN: [id number]  Date: 09/15/2017  DOB: 04-27-1960  SIMULATION AND TREATMENT PLANNING NOTE   DIAGNOSIS:  Clinical stage IIIc, cT3N0, grade 3, triple negative invasive ductal carcinoma of the right breast  Pathologic stage: ypT2, ypN0(i+)  NARRATIVE:  The patient was brought to the [facility].  Identity was confirmed.  All relevant records and images related to the planned course of therapy were reviewed.  The patient freely provided informed written consent to proceed with treatment after reviewing the details related to the planned course of therapy. The consent form was witnessed and verified by the simulation staff.  Then, the patient was set-up in a stable reproducible  supine position for radiation therapy.  CT images were obtained.  Surface markings were placed.  The CT images were loaded into the planning software.  Then the target and avoidance structures were contoured.  Treatment planning then occurred.  The radiation prescription was entered and confirmed.  Then, I designed and supervised the construction of a total of 4 medically necessary complex treatment devices.  I have requested : 3D Simulation  I have requested a DVH of the following structures: lumpectomy cavity, heart, and lungs.  I have ordered: dose calculation.  PLAN:  The patient will receive 50.4 Gy in 28 fractions directed at the right breast. The high axilla/supraclavicular region will receive 45 Gy in 25 fractions. Lumpectomy cavity will receive a boost of 10 Gy in 5 fractions.  -----------------------------------  [name], PhD, MD  This document serves as a record of services personally performed by Gery Pray, MD. It was created on his behalf by Wilburn Mylar, a trained medical scribe. The creation of this record is based on the scribe's personal observations and the provider's statements  to them. This document has been checked and approved by the attending provider.

## 2017-09-21 ENCOUNTER — Telehealth: Payer: Self-pay

## 2017-09-21 DIAGNOSIS — Z510 Encounter for antineoplastic radiation therapy: Secondary | ICD-10-CM | POA: Diagnosis not present

## 2017-09-21 NOTE — Telephone Encounter (Signed)
Returning pt's VM, requesting information on creams, deodorants, and clothing. Pt states that she re-wears shirts 2-3 times and is concerned about residual deodorant. Encouraged pt to wear a freshly laundered shirt on treatment days or to wear an undershirt. Pt verbalized understanding. Conveyed to pt that this RN would provide non-metallic deodorant and specific cream for pt to use, along with educational booklet and verbal instructions. Pt verbalized understanding. Emphasized that pt should not apply anything to skin in treatment field for at least 4 hours prior to radiation treatment. Pt verbalized understanding. Encouraged

## 2017-09-22 ENCOUNTER — Ambulatory Visit
Admission: RE | Admit: 2017-09-22 | Discharge: 2017-09-22 | Disposition: A | Discharge status: Home / Self Care | Payer: Medicaid Other | Source: Ambulatory Visit | Attending: Radiation Oncology | Admitting: Radiation Oncology

## 2017-09-22 DIAGNOSIS — C50411 Malignant neoplasm of upper-outer quadrant of right female breast: Secondary | ICD-10-CM

## 2017-09-22 DIAGNOSIS — Z171 Estrogen receptor negative status [ER-]: Principal | ICD-10-CM

## 2017-09-22 DIAGNOSIS — Z510 Encounter for antineoplastic radiation therapy: Secondary | ICD-10-CM | POA: Diagnosis not present

## 2017-09-22 MED ORDER — ALRA NON-METALLIC DEODORANT (RAD-ONC)
1.0000 "application " | Freq: Once | TOPICAL | Status: AC
  Administered 2017-09-22: 1 via TOPICAL

## 2017-09-22 MED ORDER — RADIAPLEXRX EX GEL
Freq: Once | CUTANEOUS | Status: AC
  Administered 2017-09-22: 16:00:00 via TOPICAL

## 2017-09-22 NOTE — Progress Notes (Signed)
  Radiation Oncology         [phone] ________________________________  Name: Kathryn [name] MRN: [id number]  Date: 09/22/2017  DOB: [birth year]  Simulation Verification Note    ICD-10-CM   1. Malignant neoplasm of upper-outer quadrant of right breast in female, estrogen receptor negative (HCC) C50.411 hyaluronate sodium (RADIAPLEXRX) gel   H67.5 non-metallic deodorant (ALRA) 1 application    Status: outpatient  NARRATIVE: The patient was brought to the treatment unit and placed in the planned treatment position. The clinical setup was verified. Then port films were obtained and uploaded to the radiation oncology medical record software.  The treatment beams were carefully compared against the planned radiation fields. The position location and shape of the radiation fields was reviewed. They targeted volume of tissue appears to be appropriately covered by the radiation beams. Organs at risk appear to be excluded as planned.  Based on my personal review, I approved the simulation verification. The patient's treatment will proceed as planned.  -----------------------------------  [name], PhD, MD

## 2017-09-22 NOTE — Progress Notes (Signed)
Pt here for patient teaching.  Pt given Radiation and You booklet, skin care instructions, Alra deodorant and Radiaplex gel.  Reviewed areas of pertinence such as fatigue, hair loss, skin changes, breast tenderness and breast swelling . Pt able to give teach back of to pat skin and use unscented/gentle soap,apply Radiaplex bid, avoid applying anything to skin within 4 hours of treatment, avoid wearing an under wire bra and to use an electric razor if they must shave. Pt demonstrated understanding and verbalizes understanding of information given and will contact nursing with any questions or concerns.     Http://rtanswers.org/treatmentinformation/whattoexpect/index  Ritik Stavola W Sherica Paternostro, RN BSN       

## 2017-09-23 ENCOUNTER — Ambulatory Visit
Admission: RE | Admit: 2017-09-23 | Discharge: 2017-09-23 | Disposition: A | Discharge status: Home / Self Care | Payer: Medicaid Other | Source: Ambulatory Visit | Attending: Radiation Oncology | Admitting: Radiation Oncology

## 2017-09-23 DIAGNOSIS — Z510 Encounter for antineoplastic radiation therapy: Secondary | ICD-10-CM | POA: Diagnosis not present

## 2017-09-24 ENCOUNTER — Ambulatory Visit
Admission: RE | Admit: 2017-09-24 | Discharge: 2017-09-24 | Disposition: A | Discharge status: Home / Self Care | Payer: Medicaid Other | Source: Ambulatory Visit | Attending: Radiation Oncology | Admitting: Radiation Oncology

## 2017-09-24 DIAGNOSIS — Z510 Encounter for antineoplastic radiation therapy: Secondary | ICD-10-CM | POA: Diagnosis not present

## 2017-09-27 ENCOUNTER — Ambulatory Visit
Admission: RE | Admit: 2017-09-27 | Discharge: 2017-09-27 | Disposition: A | Discharge status: Home / Self Care | Payer: Medicaid Other | Source: Ambulatory Visit | Attending: Radiation Oncology | Admitting: Radiation Oncology

## 2017-09-27 DIAGNOSIS — Z510 Encounter for antineoplastic radiation therapy: Secondary | ICD-10-CM | POA: Diagnosis not present

## 2017-09-28 ENCOUNTER — Ambulatory Visit
Admission: RE | Admit: 2017-09-28 | Discharge: 2017-09-28 | Disposition: A | Discharge status: Home / Self Care | Payer: Medicaid Other | Source: Ambulatory Visit | Attending: Radiation Oncology | Admitting: Radiation Oncology

## 2017-09-28 DIAGNOSIS — Z510 Encounter for antineoplastic radiation therapy: Secondary | ICD-10-CM | POA: Diagnosis not present

## 2017-09-29 ENCOUNTER — Ambulatory Visit
Admission: RE | Admit: 2017-09-29 | Discharge: 2017-09-29 | Disposition: A | Discharge status: Home / Self Care | Payer: Medicaid Other | Source: Ambulatory Visit | Attending: Radiation Oncology | Admitting: Radiation Oncology

## 2017-09-29 DIAGNOSIS — Z510 Encounter for antineoplastic radiation therapy: Secondary | ICD-10-CM | POA: Diagnosis not present

## 2017-09-30 ENCOUNTER — Ambulatory Visit
Admission: RE | Admit: 2017-09-30 | Discharge: 2017-09-30 | Disposition: A | Discharge status: Home / Self Care | Payer: Medicaid Other | Source: Ambulatory Visit | Attending: Radiation Oncology | Admitting: Radiation Oncology

## 2017-09-30 DIAGNOSIS — Z510 Encounter for antineoplastic radiation therapy: Secondary | ICD-10-CM | POA: Diagnosis not present

## 2017-10-01 ENCOUNTER — Ambulatory Visit
Admission: RE | Admit: 2017-10-01 | Discharge: 2017-10-01 | Disposition: A | Discharge status: Home / Self Care | Payer: Medicaid Other | Source: Ambulatory Visit | Attending: Radiation Oncology | Admitting: Radiation Oncology

## 2017-10-01 DIAGNOSIS — Z510 Encounter for antineoplastic radiation therapy: Secondary | ICD-10-CM | POA: Diagnosis not present

## 2017-10-04 ENCOUNTER — Ambulatory Visit
Admission: RE | Admit: 2017-10-04 | Discharge: 2017-10-04 | Disposition: A | Discharge status: Home / Self Care | Payer: Medicaid Other | Source: Ambulatory Visit | Attending: Radiation Oncology | Admitting: Radiation Oncology

## 2017-10-04 DIAGNOSIS — Z510 Encounter for antineoplastic radiation therapy: Secondary | ICD-10-CM | POA: Diagnosis not present

## 2017-10-05 ENCOUNTER — Ambulatory Visit
Admission: RE | Admit: 2017-10-05 | Discharge: 2017-10-05 | Disposition: A | Discharge status: Home / Self Care | Payer: Medicaid Other | Source: Ambulatory Visit | Attending: Radiation Oncology | Admitting: Radiation Oncology

## 2017-10-05 DIAGNOSIS — Z510 Encounter for antineoplastic radiation therapy: Secondary | ICD-10-CM | POA: Diagnosis not present

## 2017-10-06 ENCOUNTER — Ambulatory Visit
Admission: RE | Admit: 2017-10-06 | Discharge: 2017-10-06 | Disposition: A | Discharge status: Home / Self Care | Payer: Medicaid Other | Source: Ambulatory Visit | Attending: Radiation Oncology | Admitting: Radiation Oncology

## 2017-10-06 DIAGNOSIS — Z510 Encounter for antineoplastic radiation therapy: Secondary | ICD-10-CM | POA: Diagnosis not present

## 2017-10-07 ENCOUNTER — Ambulatory Visit
Admission: RE | Admit: 2017-10-07 | Discharge: 2017-10-07 | Disposition: A | Discharge status: Home / Self Care | Payer: Medicaid Other | Source: Ambulatory Visit | Attending: Radiation Oncology | Admitting: Radiation Oncology

## 2017-10-07 DIAGNOSIS — Z510 Encounter for antineoplastic radiation therapy: Secondary | ICD-10-CM | POA: Diagnosis not present

## 2017-10-08 ENCOUNTER — Ambulatory Visit
Admission: RE | Admit: 2017-10-08 | Discharge: 2017-10-08 | Disposition: A | Discharge status: Home / Self Care | Payer: Medicaid Other | Source: Ambulatory Visit | Attending: Radiation Oncology | Admitting: Radiation Oncology

## 2017-10-08 DIAGNOSIS — Z510 Encounter for antineoplastic radiation therapy: Secondary | ICD-10-CM | POA: Diagnosis not present

## 2017-10-11 ENCOUNTER — Ambulatory Visit
Admission: RE | Admit: 2017-10-11 | Discharge: 2017-10-11 | Disposition: A | Discharge status: Home / Self Care | Payer: Medicaid Other | Source: Ambulatory Visit | Attending: Radiation Oncology | Admitting: Radiation Oncology

## 2017-10-11 DIAGNOSIS — Z510 Encounter for antineoplastic radiation therapy: Secondary | ICD-10-CM | POA: Diagnosis not present

## 2017-10-12 ENCOUNTER — Ambulatory Visit
Admission: RE | Admit: 2017-10-12 | Discharge: 2017-10-12 | Disposition: A | Discharge status: Home / Self Care | Payer: Medicaid Other | Source: Ambulatory Visit | Attending: Radiation Oncology | Admitting: Radiation Oncology

## 2017-10-12 DIAGNOSIS — Z510 Encounter for antineoplastic radiation therapy: Secondary | ICD-10-CM | POA: Insufficient documentation

## 2017-10-12 DIAGNOSIS — C50911 Malignant neoplasm of unspecified site of right female breast: Secondary | ICD-10-CM | POA: Insufficient documentation

## 2017-10-12 DIAGNOSIS — Z171 Estrogen receptor negative status [ER-]: Secondary | ICD-10-CM | POA: Diagnosis not present

## 2017-10-13 ENCOUNTER — Ambulatory Visit
Admission: RE | Admit: 2017-10-13 | Discharge: 2017-10-13 | Disposition: A | Discharge status: Home / Self Care | Payer: Medicaid Other | Source: Ambulatory Visit | Attending: Radiation Oncology | Admitting: Radiation Oncology

## 2017-10-13 DIAGNOSIS — Z510 Encounter for antineoplastic radiation therapy: Secondary | ICD-10-CM | POA: Diagnosis not present

## 2017-10-14 ENCOUNTER — Ambulatory Visit
Admission: RE | Admit: 2017-10-14 | Discharge: 2017-10-14 | Disposition: A | Discharge status: Home / Self Care | Payer: Medicaid Other | Source: Ambulatory Visit | Attending: Radiation Oncology | Admitting: Radiation Oncology

## 2017-10-14 DIAGNOSIS — Z510 Encounter for antineoplastic radiation therapy: Secondary | ICD-10-CM | POA: Diagnosis not present

## 2017-10-15 ENCOUNTER — Ambulatory Visit
Admission: RE | Admit: 2017-10-15 | Discharge: 2017-10-15 | Disposition: A | Discharge status: Home / Self Care | Payer: Medicaid Other | Source: Ambulatory Visit | Attending: Radiation Oncology | Admitting: Radiation Oncology

## 2017-10-15 DIAGNOSIS — Z510 Encounter for antineoplastic radiation therapy: Secondary | ICD-10-CM | POA: Diagnosis not present

## 2017-10-18 ENCOUNTER — Other Ambulatory Visit: Payer: Self-pay | Admitting: Pharmacist

## 2017-10-18 ENCOUNTER — Ambulatory Visit
Admission: RE | Admit: 2017-10-18 | Discharge: 2017-10-18 | Disposition: A | Discharge status: Home / Self Care | Payer: Medicaid Other | Source: Ambulatory Visit | Attending: Radiation Oncology | Admitting: Radiation Oncology

## 2017-10-18 DIAGNOSIS — Z510 Encounter for antineoplastic radiation therapy: Secondary | ICD-10-CM | POA: Diagnosis not present

## 2017-10-18 DIAGNOSIS — Z171 Estrogen receptor negative status [ER-]: Principal | ICD-10-CM

## 2017-10-18 DIAGNOSIS — C50411 Malignant neoplasm of upper-outer quadrant of right female breast: Secondary | ICD-10-CM

## 2017-10-18 MED ORDER — CAPECITABINE 500 MG PO TABS: ORAL_TABLET | ORAL | 0 refills | Status: DC

## 2017-10-18 MED FILL — CAPECITABINE 500 MG TABLET: 500 | 11 days supply | Qty: 44 | Fill #0

## 2017-10-18 NOTE — Telephone Encounter (Signed)
Oral Oncology Pharmacist Encounter  Received call from [facility] for request for a new Xeloda prescription as patient's current prescription does not contain enough tablets for entire course of chemo radiation treatment.  New prescription for Xeloda 500 mg tablets, take 2 tablets (1000 mg) by mouth 2 times daily after a meal, to be given on days of radiation only, M-F, E scribed to the [employer]. Quantity #44 for remaining 11 treatment days.  Johny Drilling, PharmD, BCPS, BCOP  10/18/2017 11:34 AM Oral Oncology Clinic [phone]

## 2017-10-19 ENCOUNTER — Encounter: Payer: Self-pay | Admitting: *Deleted

## 2017-10-19 ENCOUNTER — Ambulatory Visit
Admission: RE | Admit: 2017-10-19 | Discharge: 2017-10-19 | Disposition: A | Discharge status: Home / Self Care | Payer: Medicaid Other | Source: Ambulatory Visit | Attending: Radiation Oncology | Admitting: Radiation Oncology

## 2017-10-19 DIAGNOSIS — Z510 Encounter for antineoplastic radiation therapy: Secondary | ICD-10-CM | POA: Diagnosis not present

## 2017-10-19 NOTE — Progress Notes (Signed)
[place] [employer]  [employer] representative received phone call from patient with questions regarding her Medicaid and financial resources.  Patient stated she had [place] Medicaid and was unsure of length of coverage and if reconstruction would be covered under [employer].  CSW provided patient with contact names and numbers for community outreach who manages the [facility] and patient also discussed financial resources.  Patient stated she received the [facility] but was recently denied SSD.  Patient stated she was not working at time of diagnosis.  CSW explained that due to requirements of financial assistance organization resources were limited.  CSW did provide patient with information on Cancer Care, and patient plans to contact today.  CSW provided contact information and encouraged patient to call with questions or concerns.    [name], MSW, LCSW, OSW-C Clinical Social Worker [facility] [phone]

## 2017-10-20 ENCOUNTER — Ambulatory Visit
Admission: RE | Admit: 2017-10-20 | Discharge: 2017-10-20 | Disposition: A | Discharge status: Home / Self Care | Payer: Medicaid Other | Source: Ambulatory Visit | Attending: Radiation Oncology | Admitting: Radiation Oncology

## 2017-10-20 DIAGNOSIS — Z510 Encounter for antineoplastic radiation therapy: Secondary | ICD-10-CM | POA: Diagnosis not present

## 2017-10-21 ENCOUNTER — Ambulatory Visit
Admission: RE | Admit: 2017-10-21 | Discharge: 2017-10-21 | Disposition: A | Discharge status: Home / Self Care | Payer: Medicaid Other | Source: Ambulatory Visit | Attending: Radiation Oncology | Admitting: Radiation Oncology

## 2017-10-21 DIAGNOSIS — Z510 Encounter for antineoplastic radiation therapy: Secondary | ICD-10-CM | POA: Diagnosis not present

## 2017-10-22 ENCOUNTER — Ambulatory Visit
Admission: RE | Admit: 2017-10-22 | Discharge: 2017-10-22 | Disposition: A | Discharge status: Home / Self Care | Payer: Medicaid Other | Source: Ambulatory Visit | Attending: Radiation Oncology | Admitting: Radiation Oncology

## 2017-10-22 DIAGNOSIS — Z510 Encounter for antineoplastic radiation therapy: Secondary | ICD-10-CM | POA: Diagnosis not present

## 2017-10-25 ENCOUNTER — Ambulatory Visit
Admission: RE | Admit: 2017-10-25 | Discharge: 2017-10-25 | Disposition: A | Discharge status: Home / Self Care | Payer: Medicaid Other | Source: Ambulatory Visit | Attending: Radiation Oncology | Admitting: Radiation Oncology

## 2017-10-25 DIAGNOSIS — Z510 Encounter for antineoplastic radiation therapy: Secondary | ICD-10-CM | POA: Diagnosis not present

## 2017-10-26 ENCOUNTER — Ambulatory Visit: Payer: Medicaid Other | Admitting: Radiation Oncology

## 2017-10-26 ENCOUNTER — Ambulatory Visit
Admission: RE | Admit: 2017-10-26 | Discharge: 2017-10-26 | Disposition: A | Discharge status: Home / Self Care | Payer: Medicaid Other | Source: Ambulatory Visit | Attending: Radiation Oncology | Admitting: Radiation Oncology

## 2017-10-26 DIAGNOSIS — Z510 Encounter for antineoplastic radiation therapy: Secondary | ICD-10-CM | POA: Diagnosis not present

## 2017-10-27 ENCOUNTER — Ambulatory Visit: Payer: Medicaid Other | Admitting: Radiation Oncology

## 2017-10-27 ENCOUNTER — Ambulatory Visit
Admission: RE | Admit: 2017-10-27 | Discharge: 2017-10-27 | Disposition: A | Discharge status: Home / Self Care | Payer: Medicaid Other | Source: Ambulatory Visit | Attending: Radiation Oncology | Admitting: Radiation Oncology

## 2017-10-27 DIAGNOSIS — Z510 Encounter for antineoplastic radiation therapy: Secondary | ICD-10-CM | POA: Diagnosis not present

## 2017-10-28 ENCOUNTER — Ambulatory Visit: Admission: RE | Admit: 2017-10-28 | Payer: Medicaid Other | Source: Ambulatory Visit
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
## 2017-10-29 ENCOUNTER — Ambulatory Visit
Admission: RE | Admit: 2017-10-29 | Discharge: 2017-10-29 | Disposition: A | Discharge status: Home / Self Care | Payer: Medicaid Other | Source: Ambulatory Visit | Attending: Radiation Oncology | Admitting: Radiation Oncology

## 2017-10-29 DIAGNOSIS — Z510 Encounter for antineoplastic radiation therapy: Secondary | ICD-10-CM | POA: Diagnosis not present

## 2017-11-01 ENCOUNTER — Ambulatory Visit: Payer: Medicaid Other | Admitting: Nurse Practitioner

## 2017-11-01 ENCOUNTER — Encounter: Payer: Self-pay | Admitting: Nurse Practitioner

## 2017-11-01 ENCOUNTER — Ambulatory Visit
Admission: RE | Admit: 2017-11-01 | Discharge: 2017-11-01 | Disposition: A | Discharge status: Home / Self Care | Payer: Medicaid Other | Source: Ambulatory Visit | Attending: Radiation Oncology | Admitting: Radiation Oncology

## 2017-11-01 VITALS — BP 127/90 | HR 110 | Ht 62.0 in | Wt 155.0 lb

## 2017-11-01 DIAGNOSIS — Z510 Encounter for antineoplastic radiation therapy: Secondary | ICD-10-CM | POA: Diagnosis not present

## 2017-11-01 DIAGNOSIS — G243 Spasmodic torticollis: Secondary | ICD-10-CM | POA: Diagnosis not present

## 2017-11-01 MED ORDER — CLONAZEPAM 0.5 MG PO TABS: 0.5000 mg | ORAL_TABLET | Freq: Three times a day (TID) | ORAL | 5 refills | Status: DC | PRN

## 2017-11-01 NOTE — Progress Notes (Signed)
Agree with history, physical, neuro exam,assessment and plan as stated.    Chanetta Moosman MD 

## 2017-11-01 NOTE — Patient Instructions (Signed)
Continue Klonipin at current dose will refill Continue tizanidine. Per PCP, Follow-up yearly and when necessary Call for worsening of symptoms Encouraged neck exercises

## 2017-11-02 ENCOUNTER — Ambulatory Visit: Payer: Medicaid Other

## 2017-11-02 DIAGNOSIS — Z510 Encounter for antineoplastic radiation therapy: Secondary | ICD-10-CM | POA: Diagnosis not present

## 2017-11-03 ENCOUNTER — Ambulatory Visit: Payer: Medicaid Other

## 2017-11-03 DIAGNOSIS — Z510 Encounter for antineoplastic radiation therapy: Secondary | ICD-10-CM | POA: Diagnosis not present

## 2017-11-04 ENCOUNTER — Ambulatory Visit
Admission: RE | Admit: 2017-11-04 | Discharge: 2017-11-04 | Disposition: A | Discharge status: Home / Self Care | Payer: Medicaid Other | Source: Ambulatory Visit | Attending: Radiation Oncology | Admitting: Radiation Oncology

## 2017-11-04 DIAGNOSIS — Z510 Encounter for antineoplastic radiation therapy: Secondary | ICD-10-CM | POA: Diagnosis not present

## 2017-11-05 ENCOUNTER — Other Ambulatory Visit: Payer: Self-pay | Admitting: Pharmacist

## 2017-11-05 ENCOUNTER — Ambulatory Visit
Admission: RE | Admit: 2017-11-05 | Discharge: 2017-11-05 | Disposition: A | Discharge status: Home / Self Care | Payer: Medicaid Other | Source: Ambulatory Visit | Attending: Radiation Oncology | Admitting: Radiation Oncology

## 2017-11-05 DIAGNOSIS — C50411 Malignant neoplasm of upper-outer quadrant of right female breast: Secondary | ICD-10-CM

## 2017-11-05 DIAGNOSIS — Z171 Estrogen receptor negative status [ER-]: Principal | ICD-10-CM

## 2017-11-05 DIAGNOSIS — Z510 Encounter for antineoplastic radiation therapy: Secondary | ICD-10-CM | POA: Diagnosis not present

## 2017-11-05 MED ORDER — CAPECITABINE 500 MG PO TABS: ORAL_TABLET | ORAL | 0 refills | Status: DC

## 2017-11-05 MED FILL — CAPECITABINE 500 MG TABLET: 500 | 2 days supply | Qty: 10 | Fill #0

## 2017-11-08 ENCOUNTER — Ambulatory Visit: Payer: Medicaid Other

## 2017-11-08 ENCOUNTER — Ambulatory Visit
Admission: RE | Admit: 2017-11-08 | Discharge: 2017-11-08 | Disposition: A | Discharge status: Home / Self Care | Payer: Medicaid Other | Source: Ambulatory Visit | Attending: Radiation Oncology | Admitting: Radiation Oncology

## 2017-11-08 ENCOUNTER — Inpatient Hospital Stay: Payer: Medicaid Other | Attending: Radiation Oncology | Admitting: Hematology and Oncology

## 2017-11-08 ENCOUNTER — Telehealth: Payer: Self-pay | Admitting: Hematology and Oncology

## 2017-11-08 ENCOUNTER — Encounter: Payer: Self-pay | Admitting: *Deleted

## 2017-11-08 DIAGNOSIS — Z923 Personal history of irradiation: Secondary | ICD-10-CM | POA: Diagnosis not present

## 2017-11-08 DIAGNOSIS — L598 Other specified disorders of the skin and subcutaneous tissue related to radiation: Secondary | ICD-10-CM | POA: Insufficient documentation

## 2017-11-08 DIAGNOSIS — Z9221 Personal history of antineoplastic chemotherapy: Secondary | ICD-10-CM | POA: Insufficient documentation

## 2017-11-08 DIAGNOSIS — C50411 Malignant neoplasm of upper-outer quadrant of right female breast: Secondary | ICD-10-CM

## 2017-11-08 DIAGNOSIS — Z171 Estrogen receptor negative status [ER-]: Secondary | ICD-10-CM | POA: Diagnosis not present

## 2017-11-08 DIAGNOSIS — Z79899 Other long term (current) drug therapy: Secondary | ICD-10-CM | POA: Insufficient documentation

## 2017-11-08 DIAGNOSIS — Z510 Encounter for antineoplastic radiation therapy: Secondary | ICD-10-CM | POA: Diagnosis not present

## 2017-11-08 NOTE — Assessment & Plan Note (Signed)
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 T3N0 stage IIIb  01/19/2017:Right breast 10 x 8.3 x 6.9 cm area of cancer involving all 4 quadrants with skin involvement extends from nipple to pectoralis muscle and involving right axillary lymph nodes, right internal mammary nodes  01/22/2017: CT CAP: 1.9 cm dome of the right hepatic lobe suspicious for liver metastases, 9 mm groundglass nodule in the right lung apex could be inflammation versus cancer Liver MRI: Lesion is benign involuted cyst  Treatment plan: 1.Neoadj chemo with dose dense AC foll by Taxoland carboplatin1/17/19- 06/24/17 2. right lumpectomy 08/17/2017 3. XRT 4.Consideration for participation inSWOG [phone] clinical trial -------------------------------------------------------------------------------------------------------------------------------------------------- Recommendation: 1.  Adjuvant Xeloda with radiation 09/23/2017-11/08/2017 2. Followed by participation in [place] clinical trial with pembrolizumab adjuvant therapy  Plan: Continue adjuvant Xeloda for 6 months followed by participation in the clinical trial subsequently.

## 2017-11-08 NOTE — Progress Notes (Signed)
Patient Care Team: [name], MD (Inactive) as PCP - General (Family Medicine)  DIAGNOSIS:  Encounter Diagnosis  Name Primary?  . 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  T3N2 stage III    01/19/2017 Breast MRI    Right breast 10 x 8.3 x 6.9 cm area of cancer involving all 4 quadrants with skin involvement extends from nipple to pectoralis muscle and involving right axillary lymph nodes, right internal mammary nodes    01/22/2017 Imaging    CT chest abdomen pelvis reveals a 6 cm right breast mass, mild right axillary lymphadenopathy, 1.9 cm peripheral enhancing lesion in the dome of the right hepatic lobe, 9 mm groundglass nodule right lung apex    01/28/2017 - 06/24/2017 Neo-Adjuvant Chemotherapy    Dose dense Adriamycin and Cytoxan  followed with Taxol and carboplatin    08/11/2017 Surgery    Right lumpectomy: IDC, grade 3, 2.9 cm, margins negative, isolated tumor cells and 2/19 lymph nodes, ER 0%, PR 0%, HER-2 negative ratio 1.1. ypT2N0 (I+)    08/25/2017 Cancer Staging    Staging form: Breast, AJCC 8th Edition - Pathologic: No Stage Recommended (ypT2, pN0(i+), cM0, G3, ER-, PR-, HER2-) - Signed by Gardenia Phlegm, NP on 08/25/2017    09/23/2017 - 11/08/2017 Radiation Therapy    Adjuvant radiation therapy with Xeloda     CHIEF COMPLIANT: Follow-up towards the conclusion of adjuvant radiation  INTERVAL HISTORY: Kathryn [name] is a 57 year old  with above-mentioned history of her triple negative breast cancer is currently completing adjuvant radiation with Xeloda.  She has had no side effects from Xeloda.  Denies any diarrhea nausea vomiting fatigue on a rash on her hands or feet.  Radiation dermatitis is present but she is tolerating it extremely well.  She has completed with radiation today.  REVIEW OF SYSTEMS:   Constitutional: Denies fevers, chills or abnormal weight loss Eyes: Denies blurriness of vision Ears, nose, mouth, throat, and face: Denies mucositis or sore throat Respiratory: Denies cough, dyspnea or wheezes Cardiovascular: Denies palpitation, chest discomfort Gastrointestinal:  Denies nausea, heartburn or change in bowel habits Skin: Denies abnormal skin rashes Lymphatics: Denies new lymphadenopathy or easy bruising Neurological:Denies numbness, tingling or new weaknesses Behavioral/Psych: Mood is stable, no new changes  Extremities: No lower extremity edema Breast: Radiation dermatitis All other systems were reviewed with the patient and are negative.  I have reviewed the past medical history, past surgical history, social history and family history with the patient and they are unchanged from previous note.  ALLERGIES:  has No Known Allergies.  MEDICATIONS:  Current Outpatient Medications  Medication Sig Dispense Refill  . capecitabine (XELODA) 500 MG tablet Take 2 tablets (1039m) by mouth 2 times daily, within 30 min after finishing food. Take on radiation days only, M-F 10 tablet 0  . cholecalciferol (VITAMIN D) 1000 UNITS tablet Take 1,000 Units by mouth daily.    . clonazePAM (KLONOPIN) 0.5 MG tablet Take 1 tablet (0.5 mg total) by mouth  3 (three) times daily as needed for anxiety. 90 tablet 5  . cyclobenzaprine (FLEXERIL) 10 MG tablet Take 10 mg by mouth 3 (three) times daily as needed for muscle spasms.    Marland Kitchen HYDROcodone-acetaminophen (NORCO) 5-325 MG tablet Take 1-2 tablets by mouth every 6 (six) hours as  needed for moderate pain or severe pain. 20 tablet 0  . lidocaine-prilocaine (EMLA) cream Apply to affected area once (Patient taking differently: Apply 1 application topically 3 times/day as needed-between meals & bedtime. Apply to affected area once) 30 g 3  . Misc Natural Products (ACAI+SUPERFRUIT/GREEN TEA) TABS Take 1 tablet by mouth daily.    . Multiple Vitamin (MULITIVITAMIN WITH MINERALS) TABS Take 1 tablet by mouth daily. Plus vit C 577m    . potassium chloride SA (K-DUR,KLOR-CON) 20 MEQ tablet Take 1 tablet (20 mEq total) by mouth 2 (two) times daily. 30 tablet 2  . vitamin C (ASCORBIC ACID) 250 MG tablet Take 500 mg by mouth daily.      No current facility-administered medications for this visit.     PHYSICAL EXAMINATION: ECOG PERFORMANCE STATUS: 1 - Symptomatic but completely ambulatory  Vitals:   11/08/17 1105  BP: 132/77  Pulse: (!) 117  Resp: 18  Temp: 97.8 F (36.6 C)  SpO2: 100%   Filed Weights   11/08/17 1105  Weight: 152 lb 3.2 oz (69 kg)    GENERAL:alert, no distress and comfortable SKIN: skin color, texture, turgor are normal, no rashes or significant lesions EYES: normal, Conjunctiva are pink and non-injected, sclera clear OROPHARYNX:no exudate, no erythema and lips, buccal mucosa, and tongue normal  NECK: supple, thyroid normal size, non-tender, without nodularity LYMPH:  no palpable lymphadenopathy in the cervical, axillary or inguinal LUNGS: clear to auscultation and percussion with normal breathing effort HEART: regular rate & rhythm and no murmurs and no lower extremity edema ABDOMEN:abdomen soft, non-tender and normal bowel sounds MUSCULOSKELETAL:no cyanosis of digits and no clubbing  NEURO: alert & oriented x 3 with fluent speech, no focal motor/sensory deficits EXTREMITIES: No lower extremity edema   LABORATORY DATA:  I have reviewed the data as listed CMP Latest Ref Rng & Units 06/24/2017 06/18/2017 06/17/2017  Glucose 70 - 140 mg/dL 115 153(H)  160(H)  BUN 7 - 26 mg/dL _0 Creatinine 0.60 - 1.10 mg/dL 0.74 0.80 0.82  Sodium 136 - 145 mmol/L 140 140 139  Potassium 3.5 - 5.1 mmol/L 3.4(L) 3.1(L) 3.1(L)  Chloride 98 - 109 mmol/L 105 102 100  CO2 22 - 29 mmol/L _1 Calcium 8.4 - 10.4 mg/dL 8.8 9.4 9.3  Total Protein 6.4 - 8.3 g/dL 6.4 6.6 6.8  Total Bilirubin 0.2 - 1.2 mg/dL 0.3 0.3 0.3  Alkaline Phos 40 - 150 U/L 99 98 98  AST 5 - 34 U/L _2 ALT 0 - 55 U/L _3 Lab Results  Component Value Date   WBC 4.1 06/24/2017   HGB 9.1 (L) 06/24/2017   HCT 27.8 (L) 06/24/2017   MCV 105.7 (H) 06/24/2017   PLT 119 (L) 06/24/2017   NEUTROABS 3.2 06/24/2017    ASSESSMENT & PLAN:  Malignant neoplasm of upper-outer quadrant of right breast in female, estrogen receptor negative [place] 08/11/2017: Right lumpectomy: IDC, grade 3, 2.9 cm, margins negative, isolated tumor cells and 2/19 lymph nodes, ER 0%, PR 0%, HER-2 negative ratio 1.1Y PT to wipe the N0 (I+)  12/29/2016: Palpable right breast lump for 6 months, mammogram  revealed 6 cm mass at 1 o'clock position 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 T3N0 stage IIIb  01/19/2017:Right breast 10 x 8.3 x 6.9 cm area of cancer involving all 4 quadrants with skin involvement extends from nipple to pectoralis muscle and involving right axillary lymph nodes, right internal mammary nodes  01/22/2017: CT CAP: 1.9 cm dome of the right hepatic lobe suspicious for liver metastases, 9 mm groundglass nodule in the right lung apex could be inflammation versus cancer Liver MRI: Lesion is benign involuted cyst  Treatment plan: 1.Neoadj chemo with dose dense AC foll by Taxoland carboplatin1/17/19- 06/24/17 2. right lumpectomy 08/17/2017 3. XRT 4.Consideration for participation inSWOG [phone] clinical  trial -------------------------------------------------------------------------------------------------------------------------------------------------- Recommendation: 1.  Adjuvant Xeloda with radiation 09/23/2017-11/08/2017 2. Followed by participation in [place] clinical trial with pembrolizumab adjuvant therapy  Plan: Continue adjuvant Xeloda for 6 months followed by participation in the clinical trial subsequently. Patient however is not very keen on going on any further treatment.  She will think about her options and will inform me in a week.  Depending on that we will make further appointments.  The 3 options that I presented to her were to do Xeloda followed by clinical trial as option 1, Xeloda only and no clinical trials option 2, clinical trial with immunotherapy as option 3 and nothing further is option 4.   No orders of the defined types were placed in this encounter.  The patient has a good understanding of the overall plan. she agrees with it. she will call with any problems that may develop before the next visit here.   Harriette Ohara, MD 11/08/17

## 2017-11-08 NOTE — Telephone Encounter (Signed)
No 10/28 los orders.

## 2017-11-09 ENCOUNTER — Encounter: Payer: Self-pay | Admitting: Radiation Oncology

## 2017-11-09 ENCOUNTER — Ambulatory Visit
Admission: RE | Admit: 2017-11-09 | Discharge: 2017-11-09 | Disposition: A | Discharge status: Home / Self Care | Payer: Medicaid Other | Source: Ambulatory Visit | Attending: Radiation Oncology | Admitting: Radiation Oncology

## 2017-11-09 DIAGNOSIS — Z510 Encounter for antineoplastic radiation therapy: Secondary | ICD-10-CM | POA: Diagnosis not present

## 2017-11-11 NOTE — Progress Notes (Signed)
  Radiation Oncology         [phone]) [phone] ________________________________  Name: Kathryn [name] MRN: [id number]  Date: 11/09/2017  DOB: 07/22/1960  End of Treatment Note  Diagnosis:   57 y.o. female with Clinical stage IIIc, cT3N0, grade 3, triple negative invasive ductal carcinoma of the right breast, Pathologic stage: ypT2,ypN0(i+)     Indication for treatment:  Curative       Radiation treatment dates:   09/23/2017 - 11/09/2017  Site/dose:    1. Right breast // 50.4 Gy in 28 fractions 2. Right high axilla/supraclavicular region // 45 Gy in 25 fractions 3. Boost to lumpectomy cavity // 10 Gy in 5 fractions  Beams/energy:    1. 3D / 6X, 10X Photon 2. 3D / 10X Photon 3. 3D / 6X Photon  Narrative: The patient tolerated radiation treatment relatively well.  She experienced mild fatigue and some expected skin irritation as she progressed through treatment. By the end of treatment, she had some mild breakdown along the nipple area, but no moist desquamation or signs of infection. Recommended she place triple antibiotic ointment to this area.  Plan: The patient has completed radiation treatment. The patient will return to radiation oncology clinic for routine followup in one month. I advised them to call or return sooner if they have any questions or concerns related to their recovery or treatment.  -----------------------------------  [name], PhD, MD  This document serves as a record of services personally performed by Gery Pray, MD. It was created on his behalf by Rae Lips, a trained medical scribe. The creation of this record is based on the scribe's personal observations and the provider's statements to them. This document has been checked and approved by the attending provider.

## 2017-11-25 ENCOUNTER — Other Ambulatory Visit: Payer: Self-pay

## 2017-11-25 DIAGNOSIS — C50411 Malignant neoplasm of upper-outer quadrant of right female breast: Secondary | ICD-10-CM

## 2017-11-25 DIAGNOSIS — Z171 Estrogen receptor negative status [ER-]: Principal | ICD-10-CM

## 2017-11-25 NOTE — Progress Notes (Signed)
Pt called to see if she can have her port removed, since she has completed all her cancer therapy, and to see when she can schedule her repeat CT scans Lung and liver. Discussed with Dr.Gudena and obtained CT scans. Pt no longer taking xeloda and is not interested in participating in the SWOG trial at this time. Send PA for CT scan and advised that pt call radiology to schedule her CT next week. Pt verbalized understanding and has no further needs at this time.

## 2017-12-03 ENCOUNTER — Telehealth: Payer: Self-pay | Admitting: Hematology and Oncology

## 2017-12-03 ENCOUNTER — Ambulatory Visit (HOSPITAL_COMMUNITY)
Admission: RE | Admit: 2017-12-03 | Discharge: 2017-12-03 | Disposition: A | Discharge status: Home / Self Care | Payer: Medicaid Other | Source: Ambulatory Visit | Attending: Hematology and Oncology | Admitting: Hematology and Oncology

## 2017-12-03 ENCOUNTER — Encounter (HOSPITAL_COMMUNITY): Payer: Self-pay

## 2017-12-03 DIAGNOSIS — Z171 Estrogen receptor negative status [ER-]: Secondary | ICD-10-CM | POA: Diagnosis present

## 2017-12-03 DIAGNOSIS — C50411 Malignant neoplasm of upper-outer quadrant of right female breast: Secondary | ICD-10-CM | POA: Diagnosis present

## 2017-12-03 MED ORDER — IOHEXOL 300 MG/ML  SOLN
100.0000 mL | Freq: Once | INTRAMUSCULAR | Status: AC | PRN
  Administered 2017-12-03: 100 mL via INTRAVENOUS

## 2017-12-03 MED ORDER — SODIUM CHLORIDE (PF) 0.9 % IJ SOLN
INTRAMUSCULAR | Status: AC
  Filled 2017-12-03: qty 50

## 2017-12-03 NOTE — Telephone Encounter (Signed)
Scheduled appt per 11/22 sch message - left message for patient with appt date and time 

## 2017-12-07 ENCOUNTER — Inpatient Hospital Stay: Payer: Medicaid Other | Attending: Radiation Oncology | Admitting: Hematology and Oncology

## 2017-12-07 ENCOUNTER — Telehealth: Payer: Self-pay | Admitting: Hematology and Oncology

## 2017-12-07 DIAGNOSIS — Z9221 Personal history of antineoplastic chemotherapy: Secondary | ICD-10-CM | POA: Insufficient documentation

## 2017-12-07 DIAGNOSIS — Z171 Estrogen receptor negative status [ER-]: Secondary | ICD-10-CM | POA: Diagnosis not present

## 2017-12-07 DIAGNOSIS — Z923 Personal history of irradiation: Secondary | ICD-10-CM | POA: Diagnosis not present

## 2017-12-07 DIAGNOSIS — Z79899 Other long term (current) drug therapy: Secondary | ICD-10-CM | POA: Insufficient documentation

## 2017-12-07 DIAGNOSIS — C50411 Malignant neoplasm of upper-outer quadrant of right female breast: Secondary | ICD-10-CM | POA: Diagnosis not present

## 2017-12-07 MED ORDER — HYALURONIC ACID 20-60 MG PO CAPS: 2.0000 | ORAL_CAPSULE | Freq: Every day | ORAL | Status: DC

## 2017-12-07 NOTE — Progress Notes (Signed)
Patient Care Team: [name], MD (Inactive) as PCP - General (Family Medicine)  DIAGNOSIS:    ICD-10-CM   1. Malignant neoplasm of upper-outer quadrant of right breast in female, estrogen receptor negative ([place]) C50.411 CBC with Differential ([place])   Z17.1 [place] ([place] only)    SUMMARY OF ONCOLOGIC HISTORY:   Malignant neoplasm of upper-outer quadrant of right breast in female, estrogen receptor negative ([place])   12/29/2016 Initial Diagnosis    Palpable right breast lump for 6 months, mammogram revealed 6 cm mass at 1 o'clock position 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  T3N2 stage III    01/19/2017 Breast MRI    Right breast 10 x 8.3 x 6.9 cm area of cancer involving all 4 quadrants with skin involvement extends from nipple to pectoralis muscle and involving right axillary lymph nodes, right internal mammary nodes    01/22/2017 Imaging    CT chest abdomen pelvis reveals a 6 cm right breast mass, mild right axillary lymphadenopathy, 1.9 cm peripheral enhancing lesion in the dome of the right hepatic lobe, 9 mm groundglass nodule right lung apex    01/28/2017 - 06/24/2017 Neo-Adjuvant Chemotherapy    Dose dense Adriamycin and Cytoxan  followed with Taxol and carboplatin    08/11/2017 Surgery    Right lumpectomy: IDC, grade 3, 2.9 cm, margins negative, isolated tumor cells and 2/19 lymph nodes, ER 0%, PR 0%, HER-2 negative ratio 1.1. ypT2N0 (I+)    08/25/2017 Cancer Staging    Staging form: Breast, AJCC 8th Edition - Pathologic: No Stage Recommended (ypT2, pN0(i+), cM0, G3, ER-, PR-, HER2-) - Signed by Gardenia Phlegm, NP on 08/25/2017    09/23/2017 - 11/08/2017 Radiation Therapy    Adjuvant radiation therapy with Xeloda     CHIEF COMPLIANT: Surveillance of triple negative right breast  cancer   INTERVAL HISTORY: Kathryn [name] is a 57 y.o. with above-mentioned history of triple negative breast cancer. She completed radiation on 11/08/17. A CT CAP on 12/03/17 showed no evidence of residual or metastatic disease. She presents to the clinic today alone to decide on further treatment with Xeloda or a clinical trial. She expressed a desire to not take any further treatment with Xeloda or immunotherapy. She expressed interest in an exercise program through the [facility] and is not interested in the [facility] program. She reviewed her medication list with me.   REVIEW OF SYSTEMS:   Constitutional: Denies fevers, chills or abnormal weight loss Eyes: Denies blurriness of vision Ears, nose, mouth, throat, and face: Denies mucositis or sore throat Respiratory: Denies cough, dyspnea or wheezes Cardiovascular: Denies palpitation, chest discomfort Gastrointestinal:  Denies nausea, heartburn or change in bowel habits Skin: Denies abnormal skin rashes Lymphatics: Denies new lymphadenopathy or easy bruising Neurological:Denies numbness, tingling or new weaknesses Behavioral/Psych: Mood is stable, no new changes  Extremities: No lower extremity edema Breast: denies any pain or lumps or nodules in either breasts All other systems were reviewed with the patient and are negative.  I have reviewed the past medical history, past surgical history, social history and family history with the patient and they are unchanged from previous note.  ALLERGIES:  has No Known Allergies.  MEDICATIONS:  Current Outpatient Medications  Medication Sig Dispense Refill  . cholecalciferol (VITAMIN D) 1000 UNITS tablet Take 1,000 Units by mouth daily.    Marland Kitchen  clonazePAM (KLONOPIN) 0.5 MG tablet Take 1 tablet (0.5 mg total) by mouth 3 (three) times daily as needed for anxiety. 90 tablet 5  . cyclobenzaprine (FLEXERIL) 10 MG tablet Take 10 mg by mouth 3 (three) times daily as needed for muscle spasms.    . Hyaluronic Acid  20-60 MG CAPS Take 2 capsules by mouth daily. 30 each   . Misc Natural Products (ACAI+SUPERFRUIT/GREEN TEA) TABS Take 1 tablet by mouth daily.    . Multiple Vitamin (MULITIVITAMIN WITH MINERALS) TABS Take 1 tablet by mouth daily. Plus vit C '500mg'$     . vitamin C (ASCORBIC ACID) 250 MG tablet Take 500 mg by mouth daily.      No current facility-administered medications for this visit.     PHYSICAL EXAMINATION: ECOG PERFORMANCE STATUS: 1 - Symptomatic but completely ambulatory  Vitals:   12/07/17 1502  BP: 113/83  Pulse: (!) 109  Resp: 18  Temp: (!) 97.5 F (36.4 C)  SpO2: 100%   Filed Weights   12/07/17 1502  Weight: 153 lb 12.8 oz (69.8 kg)    GENERAL:alert, no distress and comfortable SKIN: skin color, texture, turgor are normal, no rashes or significant lesions EYES: normal, Conjunctiva are pink and non-injected, sclera clear OROPHARYNX:no exudate, no erythema and lips, buccal mucosa, and tongue normal  NECK: supple, thyroid normal size, non-tender, without nodularity LYMPH:  no palpable lymphadenopathy in the cervical, axillary or inguinal LUNGS: clear to auscultation and percussion with normal breathing effort HEART: regular rate & rhythm and no murmurs and no lower extremity edema ABDOMEN:abdomen soft, non-tender and normal bowel sounds MUSCULOSKELETAL:no cyanosis of digits and no clubbing  NEURO: alert & oriented x 3 with fluent speech, no focal motor/sensory deficits EXTREMITIES: No lower extremity edema  LABORATORY DATA:  I have reviewed the data as listed CMP Latest Ref Rng & Units 06/24/2017 06/18/2017 06/17/2017  Glucose 70 - 140 mg/dL 115 153(H) 160(H)  BUN 7 - 26 mg/dL '10 7 10  '$ Creatinine 0.60 - 1.10 mg/dL 0.74 0.80 0.82  Sodium 136 - 145 mmol/L 140 140 139  Potassium 3.5 - 5.1 mmol/L 3.4(L) 3.1(L) 3.1(L)  Chloride 98 - 109 mmol/L 105 102 100  CO2 22 - 29 mmol/L '25 24 25  '$ Calcium 8.4 - 10.4 mg/dL 8.8 9.4 9.3  Total Protein 6.4 - 8.3 g/dL 6.4 6.6 6.8  Total  Bilirubin 0.2 - 1.2 mg/dL 0.3 0.3 0.3  Alkaline Phos 40 - 150 U/L 99 98 98  AST 5 - 34 U/L '22 21 23  '$ ALT 0 - 55 U/L '24 23 23    '$ Lab Results  Component Value Date   WBC 4.1 06/24/2017   HGB 9.1 (L) 06/24/2017   HCT 27.8 (L) 06/24/2017   MCV 105.7 (H) 06/24/2017   PLT 119 (L) 06/24/2017   NEUTROABS 3.2 06/24/2017    ASSESSMENT & PLAN:  Malignant neoplasm of upper-outer quadrant of right breast in female, estrogen receptor negative ([place]) 08/11/2017:Right lumpectomy: IDC, grade 3, 2.9 cm, margins negative, isolated tumor cells and 2/19 lymph nodes, ER 0%, PR 0%, HER-2 negative ratio 1.1Y PT to wipe the N0 (I+)  12/29/2016: Palpable right breast lump for 6 months, mammogram revealed 6 cm mass at 1 o'clock position 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 T3N0 stage IIIb  01/19/2017:Right breast 10 x 8.3 x 6.9 cm area of cancer  involving all 4 quadrants with skin involvement extends from nipple to pectoralis muscle and involving right axillary lymph nodes, right internal mammary nodes  01/22/2017: CT CAP: 1.9 cm dome of the right hepatic lobe suspicious for liver metastases, 9 mm groundglass nodule in the right lung apex could be inflammation versus cancer Liver MRI: Lesion is benign involuted cyst  Treatment plan: 1.Neoadj chemo with dose dense AC foll by Taxoland carboplatin1/17/19- 06/24/17 2.right lumpectomy 08/17/2017 3. Adjuvant Xeloda with radiation 09/23/2017-11/08/2017   -------------------------------------------------------------------------------------------------------------------------------------------------- CT chest abdomen pelvis 12/06/2017: No evidence of metastatic disease.  I offered the patient 6 months of adjuvant Xeloda versus immunotherapy clinical trial participation versus observation Patient did  not want to go on either adjuvant Xeloda or immunotherapy. She will go on surveillance.  Survivorship: Encouraged her to exercise regularly.  I gave her instructions on live strong program. Return to clinic in 6 months for physical exam labs and follow-up.  Orders Placed This Encounter  Procedures  . CBC with Differential (Cancer Center Only)    Standing Status:   Future    Standing Expiration Date:   12/08/2018  . CMP ([place] only)    Standing Status:   Future    Standing Expiration Date:   12/08/2018   The patient has a good understanding of the overall plan. she agrees with it. she will call with any problems that may develop before the next visit here.  Nicholas Lose, MD 12/07/2017   I, Cloyde Reams Dorshimer, am acting as scribe for Nicholas Lose, MD.  I have reviewed the above documentation for accuracy and completeness, and I agree with the above.

## 2017-12-07 NOTE — Telephone Encounter (Signed)
Gave avs and calendar ° °

## 2017-12-07 NOTE — Assessment & Plan Note (Signed)
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 T3N0 stage IIIb  01/19/2017:Right breast 10 x 8.3 x 6.9 cm area of cancer involving all 4 quadrants with skin involvement extends from nipple to pectoralis muscle and involving right axillary lymph nodes, right internal mammary nodes  01/22/2017: CT CAP: 1.9 cm dome of the right hepatic lobe suspicious for liver metastases, 9 mm groundglass nodule in the right lung apex could be inflammation versus cancer Liver MRI: Lesion is benign involuted cyst  Treatment plan: 1.Neoadj chemo with dose dense AC foll by Taxoland carboplatin1/17/19- 06/24/17 2.right lumpectomy 08/17/2017 3. XRT 4.Consideration for participation inSWOG [phone] clinical trial -------------------------------------------------------------------------------------------------------------------------------------------------- Recommendation: 1.Adjuvant Xeloda with radiation 09/23/2017-11/08/2017 2.Followed by participation inSWOG [phone] clinical trial with pembrolizumab adjuvant therapy

## 2017-12-13 ENCOUNTER — Ambulatory Visit
Admission: RE | Admit: 2017-12-13 | Discharge: 2017-12-13 | Disposition: A | Discharge status: Home / Self Care | Payer: Medicaid Other | Source: Ambulatory Visit | Attending: Radiation Oncology | Admitting: Radiation Oncology

## 2017-12-13 ENCOUNTER — Other Ambulatory Visit: Payer: Self-pay

## 2017-12-13 ENCOUNTER — Encounter: Payer: Self-pay | Admitting: Radiation Oncology

## 2017-12-13 VITALS — BP 128/78 | HR 111 | Temp 97.4°F | Resp 18 | Ht 62.0 in | Wt 155.0 lb

## 2017-12-13 DIAGNOSIS — Z171 Estrogen receptor negative status [ER-]: Secondary | ICD-10-CM | POA: Diagnosis not present

## 2017-12-13 DIAGNOSIS — C50411 Malignant neoplasm of upper-outer quadrant of right female breast: Secondary | ICD-10-CM | POA: Diagnosis present

## 2017-12-13 DIAGNOSIS — Z923 Personal history of irradiation: Secondary | ICD-10-CM | POA: Insufficient documentation

## 2017-12-13 DIAGNOSIS — Z79899 Other long term (current) drug therapy: Secondary | ICD-10-CM | POA: Diagnosis not present

## 2017-12-13 NOTE — Progress Notes (Signed)
Kathryn [name] is here for a follow-up appointment today with Dr. Sondra Come.Patient denies any pain or fatigue. Patient states that her breast area is still hyperpigmented. Vitals:   12/13/17 1544  BP: 128/78  Pulse: (!) 111  Resp: 18  Temp: (!) 97.4 F (36.3 C)  TempSrc: Oral  SpO2: 100%  Weight: 155 lb (70.3 kg)  Height: 5\' 2"  (1.575 m)   Wt Readings from Last 3 Encounters:  12/13/17 155 lb (70.3 kg)  12/07/17 153 lb 12.8 oz (69.8 kg)  11/08/17 152 lb 3.2 oz (69 kg)

## 2017-12-13 NOTE — Progress Notes (Signed)
Radiation Oncology         [phone] ________________________________  Name: Kathryn [name] MRN: [id number]  Date: 12/13/2017  DOB: March 06, 1960  Follow-Up Visit Note  CC: [name], MD (Inactive)  Nicholas Lose, MD    ICD-10-CM   1. Malignant neoplasm of upper-outer quadrant of right breast in female, estrogen receptor negative ([place]) C50.411    Z17.1     Diagnosis:   Clinical stage IIIc, cT3N0, grade 3, triple negative invasive ductal carcinoma of the right breast   Pathologic stage: ypT2, ypN0(i+)   Interval Since Last Radiation:  1 months, 3 days  Radiation treatment dates:   09/23/2017 - 11/09/2017  Site/dose:    1. Right breast // 50.4 Gy in 28 fractions 2. Right high axilla/supraclavicular region // 45 Gy in 25 fractions 3. Boost to lumpectomy cavity // 10 Gy in 5 fractions  Narrative:  The patient returns today for routine follow-up.  she is doing well overall. She is unaccompanied.   Since they were last seen in the office, they had a CT of chest/abdomen/pelvis on 11/22 that showed 1. Interval right mastectomy and axillary node dissection. No evidence of residual or metastatic disease. 2. New right upper lobe and apical ground-glass and airspace disease, radiation induced. This presumably obscures the right apical ground-glass nodule described on the prior exam. 3. High right hepatic lobe hypoattenuating lesion is decreased today, again favored to represent involution of a cyst. There is a lateral segment left liver lobe focus of hyperenhancement which is similar to on the prior exam, and when correlated with the 02/03/2017 MRI, may represent an area of focal nodular hyperplasia. Recommend attention on follow-up. 4.  Aortic and branch vessel atherosclerosis. 5. Esophageal air fluid level suggests dysmotility or gastroesophageal reflux. 6. Hepatic steatosis. 7.  Possible constipation.                On review of systems, she reports some lingering fatigue and arm  tingling. She reports feeling a lump when she breaths deeply in her upper right chest. She has been doing exercises in her shoulder to keep it active. she denies breast soreness and itching, new or worsened arm numbness/weakness and any other symptoms. Pertinent positives are listed and detailed within the above HPI. She denies any shortness of breath or significant cough                 ALLERGIES:  has No Known Allergies.  Meds: Current Outpatient Medications  Medication Sig Dispense Refill  . cholecalciferol (VITAMIN D) 1000 UNITS tablet Take 1,000 Units by mouth daily.    . clonazePAM (KLONOPIN) 0.5 MG tablet Take 1 tablet (0.5 mg total) by mouth 3 (three) times daily as needed for anxiety. 90 tablet 5  . cyclobenzaprine (FLEXERIL) 10 MG tablet Take 10 mg by mouth 3 (three) times daily as needed for muscle spasms.    . Hyaluronic Acid 20-60 MG CAPS Take 2 capsules by mouth daily. 30 each   . Misc Natural Products (ACAI+SUPERFRUIT/GREEN TEA) TABS Take 1 tablet by mouth daily.    . Multiple Vitamin (MULITIVITAMIN WITH MINERALS) TABS Take 1 tablet by mouth daily. Plus vit C 500mg     . vitamin C (ASCORBIC ACID) 250 MG tablet Take 500 mg by mouth daily.      No current facility-administered medications for this encounter.     Physical Findings: The patient is in no acute distress. Patient is alert and oriented.  height is 5\' 2"  (1.575 m) and  weight is 155 lb (70.3 kg). Her oral temperature is 97.4 F (36.3 C) (abnormal). Her blood pressure is 128/78 and her pulse is 111 (abnormal). Her respiration is 18 and oxygen saturation is 100%. .  No significant changes. Lungs are clear to auscultation bilaterally. Heart has regular rate and rhythm. No palpable cervical, supraclavicular, or axillary adenopathy. Abdomen soft, non-tender, normal bowel sounds.  Right breast with erythema around the nipple areolar complex area from her skin folds created by her lumpectomy scar. No skin breakdown, dominant  mass, nipple discharge, or bleeding.     Lab Findings: Lab Results  Component Value Date   WBC 4.1 06/24/2017   HGB 9.1 (L) 06/24/2017   HCT 27.8 (L) 06/24/2017   MCV 105.7 (H) 06/24/2017   PLT 119 (L) 06/24/2017    Radiographic Findings: Ct Chest W Contrast  Result Date: 12/06/2017 CLINICAL DATA:  Right breast cancer. Just completed radiation therapy 3 weeks ago. Re-evaluate lung nodule. EXAM: CT CHEST, ABDOMEN, AND PELVIS WITH CONTRAST TECHNIQUE: Multidetector CT imaging of the chest, abdomen and pelvis was performed following the standard protocol during bolus administration of intravenous contrast. CONTRAST:  144mL OMNIPAQUE IOHEXOL 300 MG/ML  SOLN COMPARISON:  Abdominal MRI of 02/03/2017. Chest abdomen and pelvic CTs of 01/22/2017. FINDINGS: CT CHEST FINDINGS Cardiovascular: Aortic and branch vessel atherosclerosis. Normal heart size, without pericardial effusion. No central pulmonary embolism, on this non-dedicated study. Mediastinum/Nodes: No supraclavicular adenopathy. Interval right axillary node dissection. No residual axillary adenopathy. No subpectoral adenopathy. No mediastinal or hilar adenopathy. The esophagus is contrast filled and mildly dilated, including on 28/2. No internal mammary adenopathy. Lungs/Pleura: No pleural fluid. Development of right upper lobe and apical ground-glass and airspace disease. This presumably obscures the previously described 9 mm right apical pulmonary nodule. Clear left lung. Musculoskeletal: Interval right mastectomy. No acute osseous abnormality. CT ABDOMEN PELVIS FINDINGS Hepatobiliary: Marked hepatic steatosis. The high right hepatic lobe hypoattenuating lesion measures 8 mm on image 46/2 and is less conspicuous than at 1.2 cm on the prior exam (when remeasured). Lateral segment left liver lobe hyperattenuating or hyperenhancing lesion is similar to on the prior Normal gallbladder, without biliary ductal dilatation. Pancreas: Normal, without mass  or ductal dilatation. Spleen: Normal in size, without focal abnormality. Adrenals/Urinary Tract: Normal adrenal glands. Upper pole left renal exophytic cyst of 3.5 cm. Normal right kidney. Normal urinary bladder. Stomach/Bowel: Normal stomach, without wall thickening. Colonic stool burden suggests constipation. Normal terminal ileum and appendix. Normal small bowel. Vascular/Lymphatic: Aortic and branch vessel atherosclerosis. No abdominopelvic adenopathy. Reproductive: Hysterectomy.  No adnexal mass. Other: No significant free fluid.  Mild pelvic floor laxity. Musculoskeletal: Disc bulge at the lumbosacral junction. No acute osseous abnormality. IMPRESSION: 1. Interval right mastectomy and axillary node dissection. No evidence of residual or metastatic disease. 2. New right upper lobe and apical ground-glass and airspace disease, radiation induced. This presumably obscures the right apical ground-glass nodule described on the prior exam. 3. High right hepatic lobe hypoattenuating lesion is decreased today, again favored to represent involution of a cyst. There is a lateral segment left liver lobe focus of hyperenhancement which is similar to on the prior exam, and when correlated with the 02/03/2017 MRI, may represent an area of focal nodular hyperplasia. Recommend attention on follow-up. 4.  Aortic and branch vessel atherosclerosis. 5. Esophageal air fluid level suggests dysmotility or gastroesophageal reflux. 6. Hepatic steatosis. 7.  Possible constipation. Electronically Signed   By: Abigail Miyamoto M.D.   On: 12/06/2017 07:24   Ct  Abdomen Pelvis W Contrast  Result Date: 12/06/2017 CLINICAL DATA:  Right breast cancer. Just completed radiation therapy 3 weeks ago. Re-evaluate lung nodule. EXAM: CT CHEST, ABDOMEN, AND PELVIS WITH CONTRAST TECHNIQUE: Multidetector CT imaging of the chest, abdomen and pelvis was performed following the standard protocol during bolus administration of intravenous contrast.  CONTRAST:  138mL OMNIPAQUE IOHEXOL 300 MG/ML  SOLN COMPARISON:  Abdominal MRI of 02/03/2017. Chest abdomen and pelvic CTs of 01/22/2017. FINDINGS: CT CHEST FINDINGS Cardiovascular: Aortic and branch vessel atherosclerosis. Normal heart size, without pericardial effusion. No central pulmonary embolism, on this non-dedicated study. Mediastinum/Nodes: No supraclavicular adenopathy. Interval right axillary node dissection. No residual axillary adenopathy. No subpectoral adenopathy. No mediastinal or hilar adenopathy. The esophagus is contrast filled and mildly dilated, including on 28/2. No internal mammary adenopathy. Lungs/Pleura: No pleural fluid. Development of right upper lobe and apical ground-glass and airspace disease. This presumably obscures the previously described 9 mm right apical pulmonary nodule. Clear left lung. Musculoskeletal: Interval right mastectomy. No acute osseous abnormality. CT ABDOMEN PELVIS FINDINGS Hepatobiliary: Marked hepatic steatosis. The high right hepatic lobe hypoattenuating lesion measures 8 mm on image 46/2 and is less conspicuous than at 1.2 cm on the prior exam (when remeasured). Lateral segment left liver lobe hyperattenuating or hyperenhancing lesion is similar to on the prior Normal gallbladder, without biliary ductal dilatation. Pancreas: Normal, without mass or ductal dilatation. Spleen: Normal in size, without focal abnormality. Adrenals/Urinary Tract: Normal adrenal glands. Upper pole left renal exophytic cyst of 3.5 cm. Normal right kidney. Normal urinary bladder. Stomach/Bowel: Normal stomach, without wall thickening. Colonic stool burden suggests constipation. Normal terminal ileum and appendix. Normal small bowel. Vascular/Lymphatic: Aortic and branch vessel atherosclerosis. No abdominopelvic adenopathy. Reproductive: Hysterectomy.  No adnexal mass. Other: No significant free fluid.  Mild pelvic floor laxity. Musculoskeletal: Disc bulge at the lumbosacral junction. No  acute osseous abnormality. IMPRESSION: 1. Interval right mastectomy and axillary node dissection. No evidence of residual or metastatic disease. 2. New right upper lobe and apical ground-glass and airspace disease, radiation induced. This presumably obscures the right apical ground-glass nodule described on the prior exam. 3. High right hepatic lobe hypoattenuating lesion is decreased today, again favored to represent involution of a cyst. There is a lateral segment left liver lobe focus of hyperenhancement which is similar to on the prior exam, and when correlated with the 02/03/2017 MRI, may represent an area of focal nodular hyperplasia. Recommend attention on follow-up. 4.  Aortic and branch vessel atherosclerosis. 5. Esophageal air fluid level suggests dysmotility or gastroesophageal reflux. 6. Hepatic steatosis. 7.  Possible constipation. Electronically Signed   By: Abigail Miyamoto M.D.   On: 12/06/2017 07:24    Impression:  The patient is recovering from the effects of radiation.  Patient is not showing any signs of radiation pneumonitis. No evidence recurrence on clinical exam  Plan:  Follow-up with radiation oncology in 3 months.  ____________________________________   [name], PhD, MD    This document serves as a record of services personally performed by Gery Pray, MD. It was created on his behalf by Mary-Margaret Loma Messing, a trained medical scribe. The creation of this record is based on the scribe's personal observations and the provider's statements to them. This document has been checked and approved by the attending provider.

## 2017-12-14 ENCOUNTER — Telehealth: Payer: Self-pay | Admitting: Adult Health

## 2017-12-14 ENCOUNTER — Telehealth: Payer: Self-pay | Admitting: *Deleted

## 2017-12-14 NOTE — Telephone Encounter (Signed)
Scheduled appt per 12/3 sch message- sent reminder letter in the mail with appt date and time   

## 2017-12-14 NOTE — Telephone Encounter (Signed)
CALLED PATIENT TO INFORM OF THREE MONTH FU WITH DR. KINARD ON 03-17-18 @ 11:15 AM, SPOKE WITH PATIENT AND SHE IS AWARE OF THIS APPT.

## 2017-12-15 ENCOUNTER — Encounter: Payer: Self-pay | Admitting: General Practice

## 2017-12-15 NOTE — Progress Notes (Signed)
[facility] CSW Progress Notes  Call from patient, confused about letter she recently received from [place] regarding her insurance status.  Does not understand the meaning of the letter. Has called [employer] program ([place]) and [facility]) and has asked for return calls to see if they have any information about this.  Patient will await return calls from these people. CSW advised that she was not certain about the meaning of the letter and advised that she wait to hear from others who may be more familiar w her situation.  Edwyna Shell, LCSW Clinical Social Worker Phone:  [phone]

## 2017-12-17 ENCOUNTER — Telehealth (HOSPITAL_COMMUNITY): Payer: Self-pay | Admitting: *Deleted

## 2017-12-17 NOTE — Telephone Encounter (Signed)
Telephoned patient at home number and advised patient that [facility] Medicaid had been extended for another month that it is still currently under review. Advised patient would call when was processed through. Patient voiced understanding.

## 2017-12-21 ENCOUNTER — Other Ambulatory Visit: Payer: Self-pay

## 2017-12-21 DIAGNOSIS — Z171 Estrogen receptor negative status [ER-]: Principal | ICD-10-CM

## 2017-12-21 DIAGNOSIS — C50411 Malignant neoplasm of upper-outer quadrant of right female breast: Secondary | ICD-10-CM

## 2017-12-21 NOTE — Progress Notes (Signed)
Pt called requesting for her port removal to be scheduled after the holidays. Orders placed to be done in 1 month.

## 2017-12-27 ENCOUNTER — Encounter (HOSPITAL_COMMUNITY): Payer: Self-pay | Admitting: *Deleted

## 2018-01-14 ENCOUNTER — Other Ambulatory Visit: Payer: Self-pay | Admitting: Radiology

## 2018-01-17 ENCOUNTER — Encounter (HOSPITAL_COMMUNITY): Payer: Self-pay

## 2018-01-17 ENCOUNTER — Ambulatory Visit (HOSPITAL_COMMUNITY)
Admission: RE | Admit: 2018-01-17 | Discharge: 2018-01-17 | Disposition: A | Discharge status: Home / Self Care | Payer: Medicaid Other | Source: Ambulatory Visit | Attending: Hematology and Oncology | Admitting: Hematology and Oncology

## 2018-01-17 DIAGNOSIS — C50411 Malignant neoplasm of upper-outer quadrant of right female breast: Secondary | ICD-10-CM | POA: Insufficient documentation

## 2018-01-17 DIAGNOSIS — Z90710 Acquired absence of both cervix and uterus: Secondary | ICD-10-CM | POA: Diagnosis not present

## 2018-01-17 DIAGNOSIS — Z90722 Acquired absence of ovaries, bilateral: Secondary | ICD-10-CM | POA: Diagnosis not present

## 2018-01-17 DIAGNOSIS — Z79899 Other long term (current) drug therapy: Secondary | ICD-10-CM | POA: Insufficient documentation

## 2018-01-17 DIAGNOSIS — Z803 Family history of malignant neoplasm of breast: Secondary | ICD-10-CM | POA: Insufficient documentation

## 2018-01-17 DIAGNOSIS — Z171 Estrogen receptor negative status [ER-]: Secondary | ICD-10-CM | POA: Diagnosis not present

## 2018-01-17 DIAGNOSIS — Z452 Encounter for adjustment and management of vascular access device: Secondary | ICD-10-CM | POA: Diagnosis not present

## 2018-01-17 DIAGNOSIS — Z809 Family history of malignant neoplasm, unspecified: Secondary | ICD-10-CM | POA: Diagnosis not present

## 2018-01-17 HISTORY — PX: IR REMOVAL TUN ACCESS W/ PORT W/O FL MOD SED: IMG2290

## 2018-01-17 LAB — CBC
HEMATOCRIT: 46.4 % — AB (ref 36.0–46.0)
Hemoglobin: 15 g/dL (ref 12.0–15.0)
MCH: 30.4 pg (ref 26.0–34.0)
MCHC: 32.3 g/dL (ref 30.0–36.0)
MCV: 94.1 fL (ref 80.0–100.0)
NRBC: 0 % (ref 0.0–0.2)
PLATELETS: 233 10*3/uL (ref 150–400)
RBC: 4.93 MIL/uL (ref 3.87–5.11)
RDW: 13.5 % (ref 11.5–15.5)
WBC: 4.8 10*3/uL (ref 4.0–10.5)

## 2018-01-17 LAB — PROTIME-INR
INR: 1.01
Prothrombin Time: 13.2 seconds (ref 11.4–15.2)

## 2018-01-17 MED ORDER — CEFAZOLIN SODIUM-DEXTROSE 2-4 GM/100ML-% IV SOLN
2.0000 g | Freq: Once | INTRAVENOUS | Status: AC
  Administered 2018-01-17: 2 g via INTRAVENOUS

## 2018-01-17 MED ORDER — FENTANYL CITRATE (PF) 100 MCG/2ML IJ SOLN
INTRAMUSCULAR | Status: AC
  Filled 2018-01-17: qty 2

## 2018-01-17 MED ORDER — FENTANYL CITRATE (PF) 100 MCG/2ML IJ SOLN
INTRAMUSCULAR | Status: AC | PRN
  Administered 2018-01-17: 50 ug via INTRAVENOUS

## 2018-01-17 MED ORDER — CEFAZOLIN SODIUM-DEXTROSE 2-4 GM/100ML-% IV SOLN
INTRAVENOUS | Status: AC
  Administered 2018-01-17: 2 g via INTRAVENOUS
  Filled 2018-01-17: qty 100

## 2018-01-17 MED ORDER — LIDOCAINE-EPINEPHRINE 2 %-1:100000 IJ SOLN
INTRAMUSCULAR | Status: AC | PRN
  Administered 2018-01-17: 20 mL

## 2018-01-17 MED ORDER — LIDOCAINE-EPINEPHRINE (PF) 1 %-1:200000 IJ SOLN
INTRAMUSCULAR | Status: AC
  Filled 2018-01-17: qty 30

## 2018-01-17 MED ORDER — MIDAZOLAM HCL 2 MG/2ML IJ SOLN
INTRAMUSCULAR | Status: AC
  Filled 2018-01-17: qty 2

## 2018-01-17 MED ORDER — MIDAZOLAM HCL 2 MG/2ML IJ SOLN
INTRAMUSCULAR | Status: AC | PRN
  Administered 2018-01-17: 1 mg via INTRAVENOUS

## 2018-01-17 NOTE — Procedures (Signed)
  Procedure: L port removal   EBL:   minimal Complications:  none immediate  See full dictation in [facility].  Dillard Cannon MD Main # [phone] Pager  [phone]

## 2018-01-17 NOTE — H&P (Signed)
Chief Complaint: Patient was seen in consultation today for port removal.  Referring Physician(s): Gudena,Vinay  Supervising Physician: Arne Cleveland  Patient Status: [facility] - Out-pt  History of Present Illness: Kathryn [name] is a 58 y.o. 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 Patient reports last use of port was February 2019 without further treatments planned at this time. She is aware of procedure requested and wishes to proceed.   Past Medical History:  Diagnosis Date  . Anxiety   . Cancer ([place])   . Depression with anxiety   . Endometrioma   . Endometriosis   . Mass of pelvis   . Torticollis, unspecified NECK MUSCLE--  OCCASIONAL    Past Surgical History:  Procedure Laterality Date  . ABDOMINAL HYSTERECTOMY  06/29/2011   Procedure: HYSTERECTOMY ABDOMINAL;  Surgeon: Selinda Orion, MD;  Location: [facility];  Service: Gynecology;  Laterality: N/A;  PROCEDURE READS: EXPLORATORY LAPAROTOMY, TOTAL ABDOMINAL HYSTERECTOMY WITH BSO AND  SUPERCERVICAL HYSTERECTOMY WITH BSO AND EXCISION OF ENDOMETRIOMIAS OWER  . BENIGN BREAST CYST REMOVED  YRS AGO  . BREAST LUMPECTOMY WITH RADIOACTIVE SEED AND AXILLARY LYMPH NODE DISSECTION Right 08/10/2017   Procedure: BREAST LUMPECTOMY WITH RADIOACTIVE SEED (X2)  AND AXILLARY LYMPH NODE DISSECTION;  Surgeon: Fanny Skates, MD;  Location: [place];  Service: General;  Laterality: Right;  . ENDOMETRIAL ABLATION  06/29/2011   Procedure: ENDOMETRIAL ABLATION;  Surgeon: Selinda Orion, MD;  Location[facility];  Service: Gynecology;  Laterality: N/A;  . EXCISION OF A HIDRADENITIS ABSCESS, LEFT INGUINAL AREA  03-30-2005  . IR IMAGING GUIDED PORT INSERTION  01/26/2017  . IR US GUIDE VASC ACCESS LEFT  01/26/2017  . LAPAROTOMY  06/29/2011   Procedure: EXPLORATORY LAPAROTOMY;  Surgeon: Selinda Orion, MD;  Location: [facility];  Service: Gynecology;  Laterality: N/A;  . SALPINGOOPHORECTOMY  06/29/2011   Procedure: SALPINGO OOPHERECTOMY;  Surgeon: Selinda Orion, MD;  Location: [facility];  Service: Gynecology;  Laterality: Bilateral;    Allergies: Patient has no known allergies.  Medications: Prior to Admission medications   Medication Sig Start Date End Date Taking? Authorizing Provider  cholecalciferol (VITAMIN D) 1000 UNITS tablet Take 1,000 Units by mouth daily.   Yes [provider]  clonazePAM (KLONOPIN) 0.5 MG tablet Take 1 tablet (0.5 mg total) by mouth 3 (three) times daily as needed for anxiety. 11/01/17  Yes Dennie Bible, NP  cyclobenzaprine (FLEXERIL) 10 MG tablet Take 10 mg by mouth 3 (three) times daily as needed for muscle spasms.   Yes [provider]  Hyaluronic Acid 20-60 MG CAPS Take 2 capsules by mouth daily. 12/07/17  Yes Nicholas Lose, MD  Misc Natural Products (ACAI+SUPERFRUIT/GREEN TEA) TABS Take 1 tablet by mouth daily.   Yes [provider]  Multiple Vitamin (MULITIVITAMIN WITH MINERALS) TABS Take 1 tablet by mouth daily. Plus vit C 500mg    Yes [provider]  vitamin C (ASCORBIC ACID) 250 MG tablet Take 500 mg by mouth daily as needed (immunity).    Yes [provider]     Family History  Problem Relation Age of Onset  . Cancer Father   . Diabetes Father   . Hypertension Father   . Cancer Maternal Aunt   . Heart attack Paternal Aunt   . Breast cancer Paternal  Aunt   . Breast cancer Cousin   . Anesthesia problems Neg Hx     Social History   Socioeconomic History  . Marital status: Divorced    Spouse name: Not on file  . Number of children: 2  . Years of education: Not on file  . Highest education level: Not on file  Occupational History  . Occupation: [employer]    Employer: [employer]  . Financial resource strain: Not on file  . Food insecurity:    Worry: Not on file    Inability: Not on file  . Transportation needs:    Medical: Not on file    Non-medical: Not on file  Tobacco Use  . Smoking status: Never Smoker  . Smokeless tobacco: Never Used  Substance and Sexual Activity  . Alcohol use: Yes    Comment: occ  . Drug use: No  . Sexual activity: Not Currently    Birth control/protection: None, Surgical  Lifestyle  . Physical activity:    Days per week: 0 days    Minutes per session: 0 min  . Stress: Only a little  Relationships  . Social connections:    Talks on phone: More than three times a week    Gets together: Once a week    Attends religious service: 1 to 4 times per year    Active member of club or organization: No    Attends meetings of clubs or organizations: Never    Relationship status: Divorced  Other Topics Concern  . Not on file  Social History Narrative  . Not on file     Review of Systems: A 12 point ROS discussed and pertinent positives are indicated in the HPI above.  All other systems are negative.  Review of Systems  Constitutional: Negative for chills and fever.  Respiratory: Negative for shortness of breath.   Cardiovascular: Negative for chest pain.  Gastrointestinal: Negative for abdominal pain, diarrhea, nausea and vomiting.  Genitourinary: Negative for dysuria and hematuria.  Neurological: Negative for dizziness and syncope.    Vital Signs: BP 131/87   Pulse (!) 104   Temp 98.1 F (36.7 C) (Oral)   Resp 16   Ht 5' 2.5" (1.588 m)   Wt 154 lb (69.9 kg)   LMP 06/22/2011   SpO2 98%   BMI 27.72 kg/m   Physical Exam Vitals signs reviewed.  Constitutional:      General: She is not in acute distress.    Appearance: Normal appearance.  HENT:     Head: Normocephalic and atraumatic.  Cardiovascular:     Rate and Rhythm: Regular rhythm. Tachycardia present.  Pulmonary:     Effort: Pulmonary  effort is normal.     Breath sounds: Normal breath sounds.  Abdominal:     General: There is no distension.     Palpations: Abdomen is soft.  Skin:    General: Skin is warm and dry.  Neurological:     Mental Status: She is alert and oriented to person, place, and time.  Psychiatric:        Mood and Affect: Mood normal.        Behavior: Behavior normal.        Thought Content: Thought content normal.        Judgment: Judgment normal.      MD Evaluation Airway: WNL Heart: WNL Abdomen: WNL Chest/ Lungs: WNL ASA  Classification: 2 Mallampati/Airway Score: Two   Imaging: No  results found.  Labs:  CBC: Recent Labs    06/17/17 1020 06/18/17 0903 06/24/17 0945 01/17/18 0648  WBC 1.7* 4.0 4.1 4.8  HGB 10.2* 9.7* 9.1* 15.0  HCT 30.2* 29.1* 27.8* 46.4*  PLT 202 205 119* 233    COAGS: Recent Labs    01/26/17 0731 01/17/18 0648  INR 0.85 1.01    BMP: Recent Labs    06/10/17 0759 06/17/17 1020 06/18/17 0903 06/24/17 0945  NA 139 139 140 140  K 3.2* 3.1* 3.1* 3.4*  CL 103 100 102 105  CO2 22 25 24 25   GLUCOSE 198* 160* 153* 115  BUN 9 10 7 10   CALCIUM 9.1 9.3 9.4 8.8  CREATININE 0.86 0.82 0.80 0.74  GFRNONAA >60 >60 >60 >60  GFRAA >60 >60 >60 >60    LIVER FUNCTION TESTS: Recent Labs    06/10/17 0759 06/17/17 1020 06/18/17 0903 06/24/17 0945  BILITOT 0.3 0.3 0.3 0.3  AST 22 23 21 22   ALT 26 23 23 24   ALKPHOS 100 98 98 99  PROT 6.6 6.8 6.6 6.4  ALBUMIN 3.9 3.9 3.9 3.8    TUMOR MARKERS: No results for input(s): AFPTM, CEA, CA199, CHROMGRNA in the last 8760 hours.  Assessment and Plan:  Patient with history of invasive ductal carcinoma of the right breast s/p radiation and chemotherapy who presents for port removal today. Last port use was February 2019 and patient has no further treatments planned.   NPO since 10 pm last night, she does not take blood thinning medications. Afebrile, WBC 4.8, hgb 15.0, plt 233, INR 1.01.   Risks and  benefits of image guided port-a-catheter removal was discussed with the patient including, but not limited to bleeding, infection, pneumothorax and need for additional procedures.  All of the patient's questions were answered, patient is agreeable to proceed.  Consent signed and in chart.  Thank you for this interesting consult.  I greatly enjoyed meeting Kathryn [name] and look forward to participating in their care.  A copy of this report was sent to the requesting provider on this date.  Electronically Signed: Joaquim Nam, PA-C 01/17/2018, 7:45 AM   I spent a total of  15 Minutes in face to face in clinical consultation, greater than 50% of which was counseling/coordinating care for port removal.

## 2018-01-17 NOTE — Discharge Instructions (Addendum)
Implanted Port Removal, Care After °This sheet gives you information about how to care for yourself after your procedure. Your health care provider may also give you more specific instructions. If you have problems or questions, contact your health care provider. °What can I expect after the procedure? °After the procedure, it is common to have: °· Soreness or pain near your incision. °· Some swelling or bruising near your incision. °Follow these instructions at home: °Medicines °· Take over-the-counter and prescription medicines only as told by your health care provider. °· If you were prescribed an antibiotic medicine, take it as told by your health care provider. Do not stop taking the antibiotic even if you start to feel better. °Bathing °· Do not take baths, swim, or use a hot tub until your health care provider approves. Ask your health care provider if you can take showers. You may only be allowed to take sponge baths. °Incision care ° °· Follow instructions from your health care provider about how to take care of your incision. Make sure you: °? Wash your hands with soap and water before you change your bandage (dressing). If soap and water are not available, use hand sanitizer. °? Change your dressing as told by your health care provider. °? Keep your dressing dry. °? Leave stitches (sutures), skin glue, or adhesive strips in place. These skin closures may need to stay in place for 2 weeks or longer. If adhesive strip edges start to loosen and curl up, you may trim the loose edges. Do not remove adhesive strips completely unless your health care provider tells you to do that. °· Check your incision area every day for signs of infection. Check for: °? More redness, swelling, or pain. °? More fluid or blood. °? Warmth. °? Pus or a bad smell. °Driving ° °· Do not drive for 24 hours if you were given a medicine to help you relax (sedative) during your procedure. °· If you did not receive a sedative, ask your  health care provider when it is safe to drive. °Activity °· Return to your normal activities as told by your health care provider. Ask your health care provider what activities are safe for you. °· Do not lift anything that is heavier than 10 lb (4.5 kg), or the limit that you are told, until your health care provider says that it is safe. °· Do not do activities that involve lifting your arms over your head. °General instructions °· Do not use any products that contain nicotine or tobacco, such as cigarettes and e-cigarettes. These can delay healing. If you need help quitting, ask your health care provider. °· Keep all follow-up visits as told by your health care provider. This is important. °Contact a health care provider if: °· You have more redness, swelling, or pain around your incision. °· You have more fluid or blood coming from your incision. °· Your incision feels warm to the touch. °· You have pus or a bad smell coming from your incision. °· You have pain that is not relieved by your pain medicine. °Get help right away if you have: °· A fever or chills. °· Chest pain. °· Difficulty breathing. °Summary °· After the procedure, it is common to have pain, soreness, swelling, or bruising near your incision. °· If you were prescribed an antibiotic medicine, take it as told by your health care provider. Do not stop taking the antibiotic even if you start to feel better. °· Do not drive for 24 hours   if you were given a sedative during your procedure. °· Return to your normal activities as told by your health care provider. Ask your health care provider what activities are safe for you. °This information is not intended to replace advice given to you by your health care provider. Make sure you discuss any questions you have with your health care provider. °Document Released: 12/10/2014 Document Revised: 02/11/2017 Document Reviewed: 02/11/2017 °Elsevier Interactive Patient Education © 2019 [employer] ° ° °Moderate Conscious Sedation, Adult, Care After °These instructions provide you with information about caring for yourself after your procedure. Your health care provider may also give you more specific instructions. Your treatment has been planned according to current medical practices, but problems sometimes occur. Call your health care provider if you have any problems or questions after your procedure. °What can I expect after the procedure? °After your procedure, it is common: °· To feel sleepy for several hours. °· To feel clumsy and have poor balance for several hours. °· To have poor judgment for several hours. °· To vomit if you eat too soon. °Follow these instructions at home: °For at least 24 hours after the procedure: ° °· Do not: °? Participate in activities where you could fall or become injured. °? Drive. °? Use heavy machinery. °? Drink alcohol. °? Take sleeping pills or medicines that cause drowsiness. °? Make important decisions or sign legal documents. °? Take care of children on your own. °· Rest. °Eating and drinking °· Follow the diet recommended by your health care provider. °· If you vomit: °? Drink water, juice, or soup when you can drink without vomiting. °? Make sure you have little or no nausea before eating solid foods. °General instructions °· Have a responsible adult stay with you until you are awake and alert. °· Take over-the-counter and prescription medicines only as told by your health care provider. °· If you smoke, do not smoke without supervision. °· Keep all follow-up visits as told by your health care provider. This is important. °Contact a health care provider if: °· You keep feeling nauseous or you keep vomiting. °· You feel light-headed. °· You develop a rash. °· You have a fever. °Get help right away if: °· You have trouble breathing. °This information is not intended to replace advice given to you by your health care provider. Make sure you discuss any questions  you have with your health care provider. °Document Released: 10/19/2012 Document Revised: 06/03/2015 Document Reviewed: 04/20/2015 °Elsevier Interactive Patient Education © 2019 [employer] ° °

## 2018-02-02 DIAGNOSIS — Z0271 Encounter for disability determination: Secondary | ICD-10-CM

## 2018-03-11 IMAGING — MR MR ABDOMEN WO/W CM
9 of 18 series · 19 of 48 positions shown · IV contrast (multihance)
Comparison: CT scan 01/22/2017.  MRI from 05/27/2011.

CLINICAL DATA: Breast cancer with 1.9 cm peripherally enhancing
lesion identified in the dome of the right liver on recent CT scan.

EXAM:
MRI ABDOMEN WITHOUT AND WITH CONTRAST
TECHNIQUE: Multiplanar multisequence MR imaging of the abdomen was performed
both before and after the administration of intravenous contrast.
CONTRAST:  15mL MULTIHANCE GADOBENATE DIMEGLUMINE 529 MG/ML IV SOLN

[Series 3: T2 fat-sat · axial · 5.0mm · 0.78mm/px · z∈[-147,+113]mm · 3 of 53 slices shown]
[im 1/53]
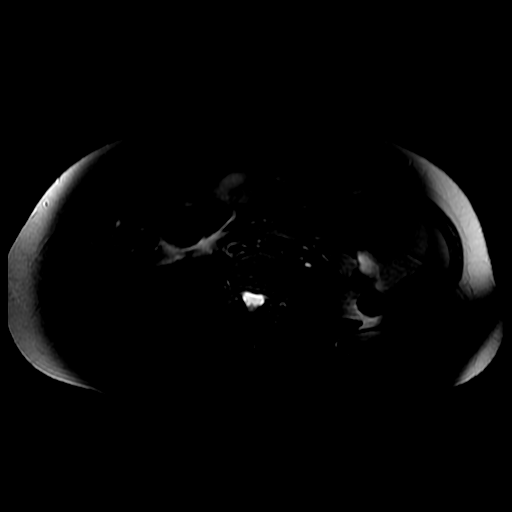
[im 27/53]
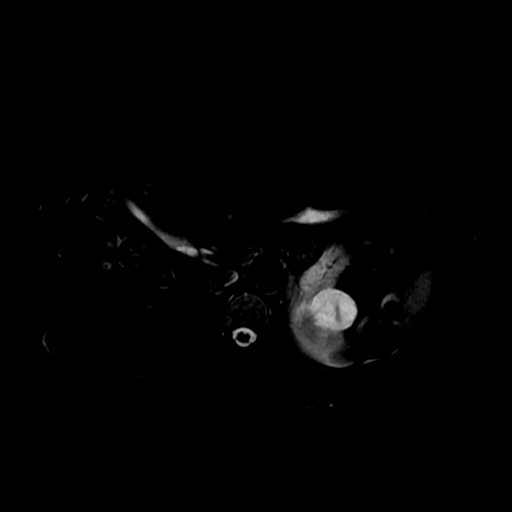
[im 53/53]
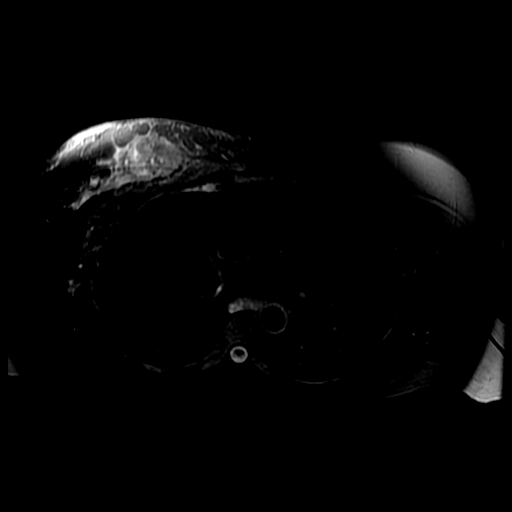

[Series 4: DWI b500 · axial · 6.0mm · 1.48mm/px · z∈[-149,+116]mm · 2 of 67 slices shown]
[im 1/67]
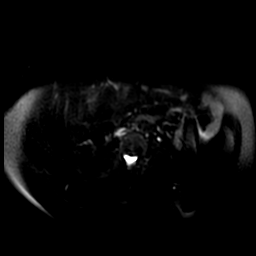
[im 67/67]
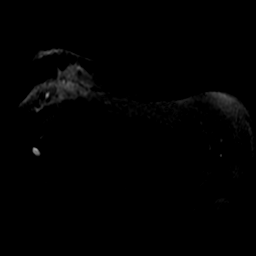

[Series 5: ax dualecho · axial · 5.0mm · 0.78mm/px · z∈[-151,+119]mm · 4 of 110 slices shown]
[im 1/110]
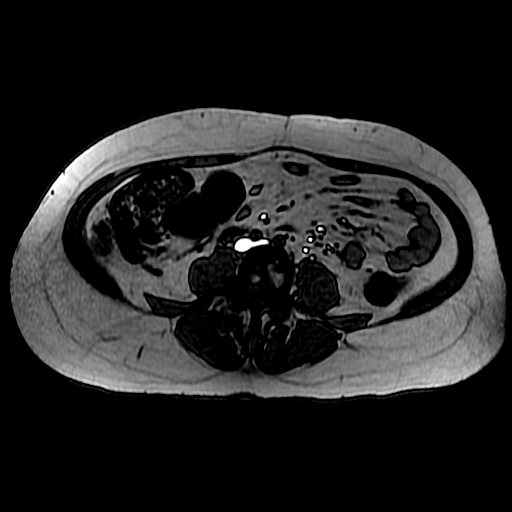
[im 37/110]
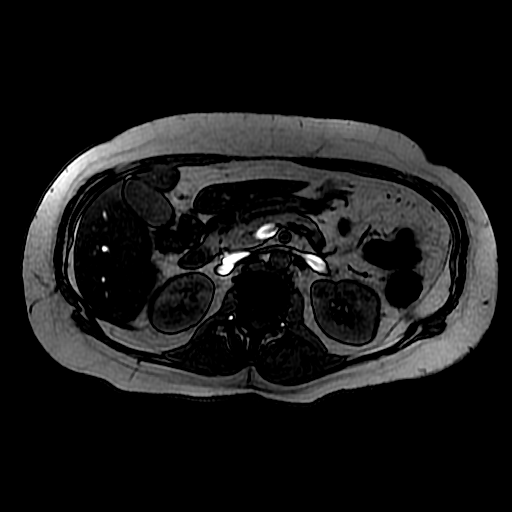
[im 73/110]
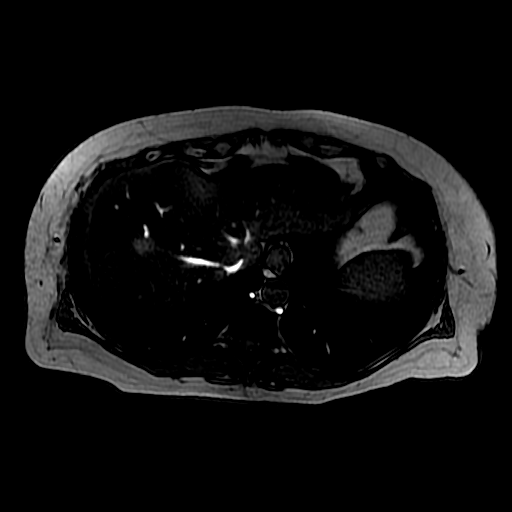
[im 110/110]
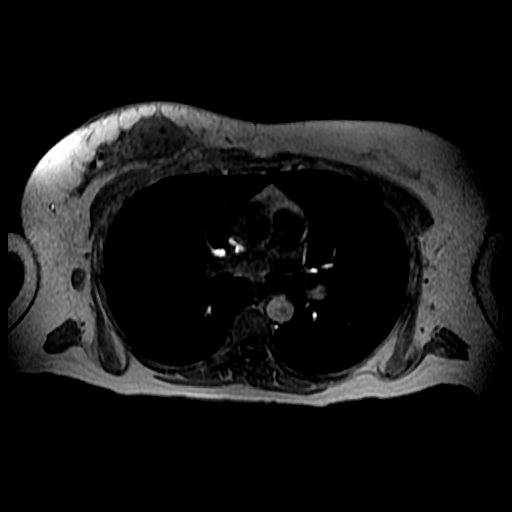

[Series 6: T2 · axial · 5.0mm · 0.78mm/px · z∈[-151,+119]mm · 2 of 55 slices shown (1 of 2)]
[im 1/55]
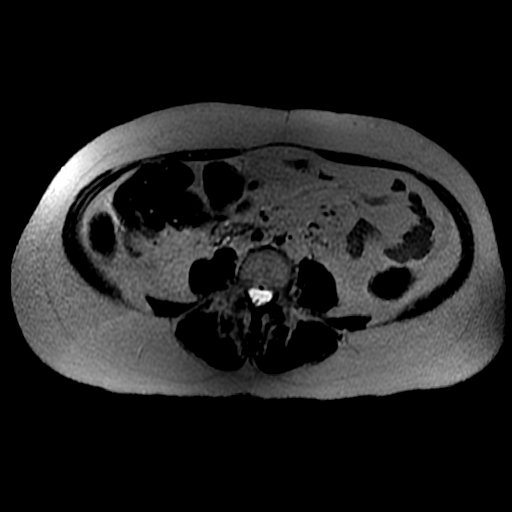
[im 55/55]
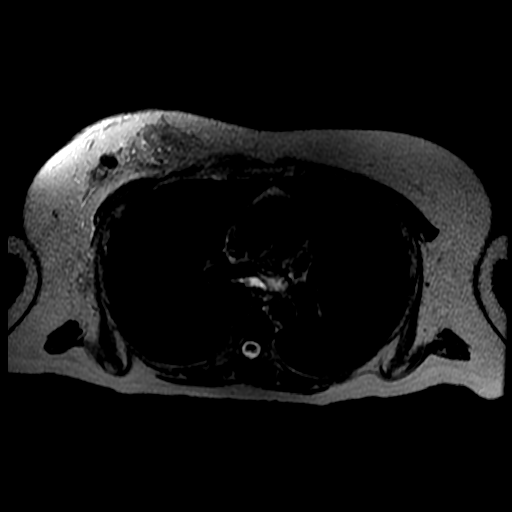

[Series 7: T2 · coronal · 5.0mm · 0.78mm/px · 1 of 39 slices shown (2 of 2)]
[im 1/39]
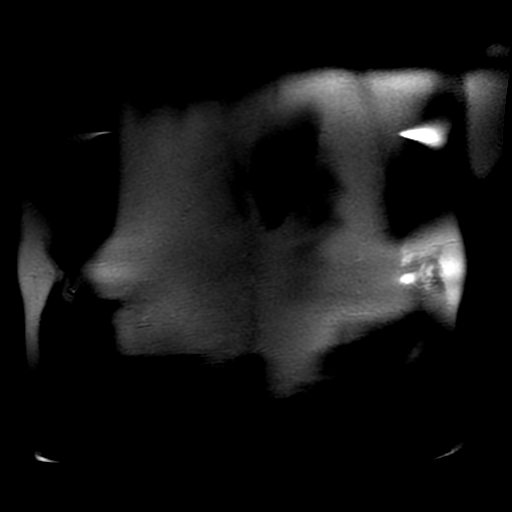

[Series 8: bSSFP · axial · 5.0mm · 0.78mm/px · z∈[-151,+119]mm · 2 of 55 slices shown]
[im 1/55]
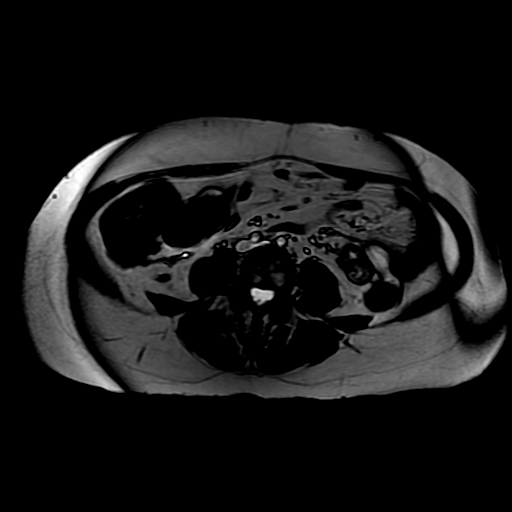
[im 55/55]
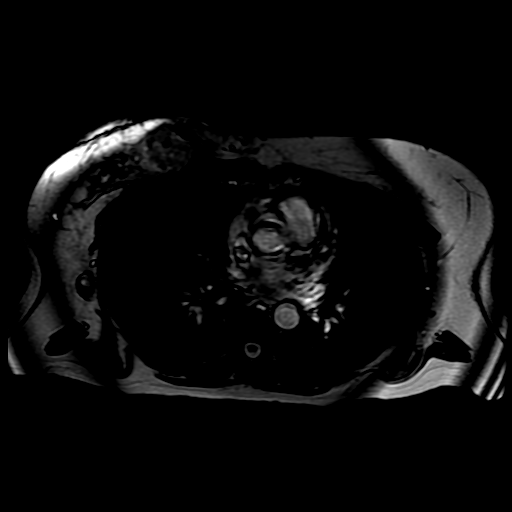

[Series 400: DWI · axial · 6.0mm · 1.48mm/px · 1 of 35 slices shown]
[im 1/35]
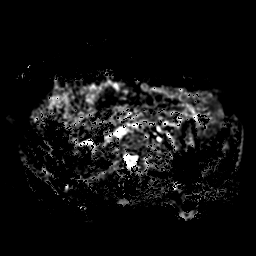

[Series 900: T1 dynamic · axial · 6.0mm · 0.78mm/px · z∈[-139,+122]mm · 3 of 88 slices shown (1 of 2)]
[im 1/88]
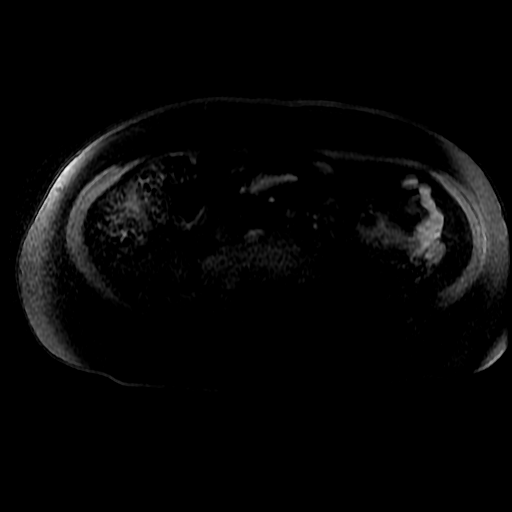
[im 44/88]
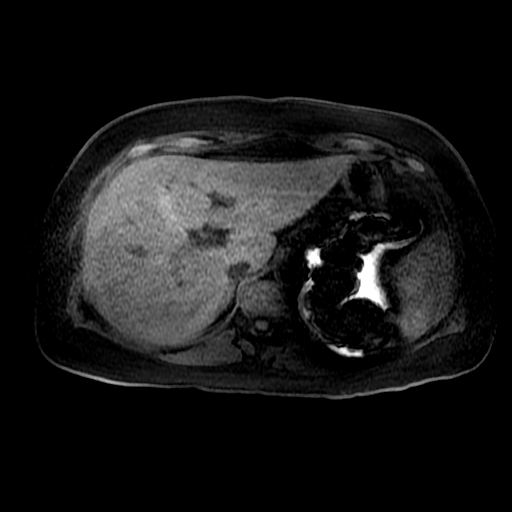
[im 88/88]
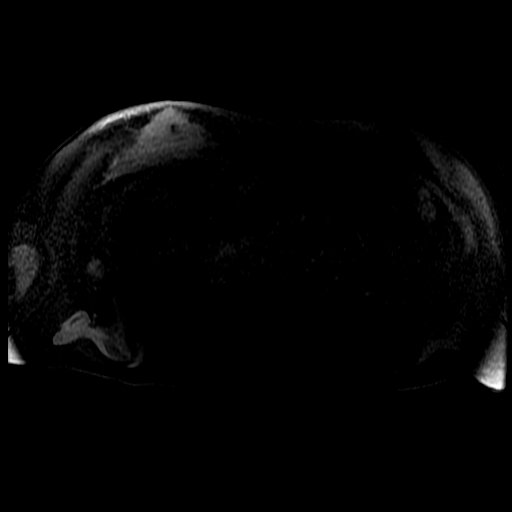

[Series 901: T1 dynamic · axial · 6.0mm · 0.78mm/px · 1 of 88 slices shown (2 of 2)]
[im 1/88]
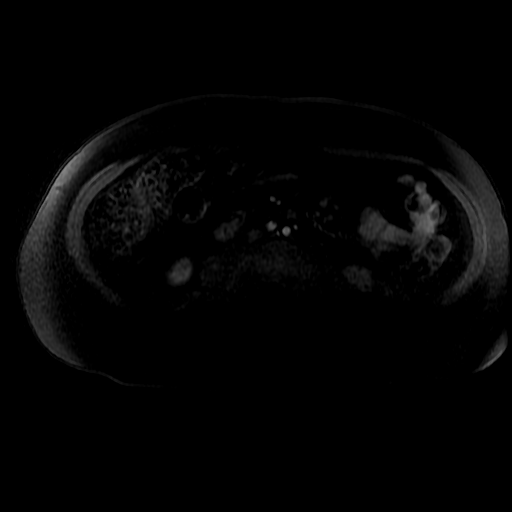

[19 of 48 positions shown; findings below may reference images not displayed]

FINDINGS: Lower chest: Unremarkable.

Hepatobiliary: 13 x 17 mm lesion is identified in the dome of the
liver, corresponding the abnormality seen on the recent CT scan.
This lesion is in the precise location of the 2.6 x 3.7 cm cyst on
the MRI from almost 6 years ago. On today's exam, the lesion has
diffuse T1 shortening on precontrast imaging suggesting central
hemorrhage or protein. Given the diffuse fatty deposition in the
liver parenchyma, out of phase imaging accentuates the the lesion as
does the fat-suppressed T1 precontrast gradient sequences. This
lesion is noted to have a thin hypointense rim on both T1 and T2
weighted imaging suggesting the presence of a fibrous rim or
peripheral hemosiderin. After IV contrast administration, irregular
peripheral enhancement is evident although the central portion of
the lesion does not enhance. A degree of restricted diffusion is
noted within the abnormality.

No other focal suspicious liver lesion evident. There is no evidence
for gallstones, gallbladder wall thickening, or pericholecystic
fluid. No intrahepatic or extrahepatic biliary dilation.

Pancreas: No focal mass lesion. No dilatation of the main duct. No
intraparenchymal cyst. No peripancreatic edema.

Spleen:  No splenomegaly. No focal mass lesion.

Adrenals/Urinary Tract: No adrenal nodule or mass. Right kidney
unremarkable. 2.6 cm simple cyst identified upper pole left kidney.

Stomach/Bowel: Stomach is nondistended. No gastric wall thickening.
No evidence of outlet obstruction. Duodenum is normally positioned
as is the ligament of Treitz. No small bowel or colonic dilatation
within the visualized abdomen.

Vascular/Lymphatic: No abdominal aortic aneurysm. There is no
gastrohepatic or hepatoduodenal ligament lymphadenopathy. No
intraperitoneal or retroperitoneal lymphadenopathy.

Other:  No intraperitoneal free fluid.

Musculoskeletal: No abnormal marrow enhancement within the
visualized bony anatomy.
IMPRESSION: 13 x 17 mm lesion in the dome of the liver is in the precise
location of a much larger cyst on MRI from almost 6 years ago. The
lesion today has central high signal intensity on T1 weighted
imaging suggesting proteinaceous debris or hemorrhage with a thin
hypointense rim. As on the CT, there is a thin border of irregular
peripheral enhancement, likely accentuated on the previous CT due to
the background fatty liver. Imaging features today are felt to be
most likely related to interval involution of the cyst into a much
smaller proteinaceous/hemorrhagic lesion with a hypointense capsule
and a thin rim of enhancement due to a pseudo capsule or chronic
reactive fibrous tissue. As metastatic lesion is considered less
likely but cannot be completely excluded, follow-up MRI in 3 months
is recommended.

2.6 cm simple cyst in the left kidney.

## 2018-03-17 ENCOUNTER — Ambulatory Visit: Payer: Medicaid Other | Admitting: Radiation Oncology

## 2018-03-17 ENCOUNTER — Telehealth: Payer: Self-pay | Admitting: *Deleted

## 2018-03-17 NOTE — Telephone Encounter (Signed)
Returned patient's phone call, spoke with patient 

## 2018-03-21 ENCOUNTER — Encounter: Payer: Self-pay | Admitting: Radiation Oncology

## 2018-03-21 ENCOUNTER — Other Ambulatory Visit: Payer: Self-pay

## 2018-03-21 ENCOUNTER — Ambulatory Visit
Admission: RE | Admit: 2018-03-21 | Discharge: 2018-03-21 | Disposition: A | Discharge status: Home / Self Care | Payer: Medicaid Other | Source: Ambulatory Visit | Attending: Radiation Oncology | Admitting: Radiation Oncology

## 2018-03-21 VITALS — BP 148/81 | HR 115 | Temp 98.3°F | Resp 18 | Ht 62.0 in | Wt 157.5 lb

## 2018-03-21 DIAGNOSIS — C773 Secondary and unspecified malignant neoplasm of axilla and upper limb lymph nodes: Secondary | ICD-10-CM

## 2018-03-21 DIAGNOSIS — Z923 Personal history of irradiation: Secondary | ICD-10-CM | POA: Insufficient documentation

## 2018-03-21 DIAGNOSIS — C50411 Malignant neoplasm of upper-outer quadrant of right female breast: Secondary | ICD-10-CM

## 2018-03-21 DIAGNOSIS — Z853 Personal history of malignant neoplasm of breast: Secondary | ICD-10-CM | POA: Diagnosis not present

## 2018-03-21 DIAGNOSIS — Z171 Estrogen receptor negative status [ER-]: Secondary | ICD-10-CM

## 2018-03-21 DIAGNOSIS — R5383 Other fatigue: Secondary | ICD-10-CM | POA: Insufficient documentation

## 2018-03-21 DIAGNOSIS — Z79899 Other long term (current) drug therapy: Secondary | ICD-10-CM | POA: Diagnosis not present

## 2018-03-21 NOTE — Progress Notes (Signed)
Radiation Oncology         ([phone] ________________________________  Name: Kathryn [name] MRN: [id number]  Date: 03/21/2018  DOB: 1960/09/23  Follow-Up Visit Note  CC: [name], MD (Inactive)  Nicholas Lose, MD    ICD-10-CM   1. Malignant neoplasm of upper-outer quadrant of right breast in female, estrogen receptor negative ([place]) C50.411    Z17.1   2. 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 Right breast // 50.4 Gy in 28 fractions 2. Right high axilla/supraclavicular region // 45 Gy in 25 fractions 3. Boost to lumpectomy cavity // 10 Gy in 5 fractions    She had her port catheter removed on January 26, 2018.                 On review of systems, she reports some lingering fatigue, improved since November. She has some sensitivity in her right breast near the incision. The lump she reported feeling in November while taking deep breaths has resolved. She reports arm soreness if she sleeps on it. she denies nipple discharge/bleeding, new or worsened arm numbness/weakness and any other symptoms. Pertinent positives are listed and detailed within the above HPI.                ALLERGIES:  has No Known Allergies.  Meds: Current Outpatient Medications  Medication Sig Dispense Refill  . cholecalciferol (VITAMIN D) 1000 UNITS tablet Take 1,000 Units by mouth daily.    . clonazePAM (KLONOPIN) 0.5 MG tablet Take 1 tablet (0.5 mg total) by mouth 3 (three) times daily as needed for anxiety. 90 tablet 5  . cyclobenzaprine (FLEXERIL) 10 MG tablet Take 10 mg by mouth 3 (three) times daily as needed for muscle spasms.    . Multiple Vitamin  (MULITIVITAMIN WITH MINERALS) TABS Take 1 tablet by mouth daily. Plus vit C 577m    . vitamin C (ASCORBIC ACID) 250 MG tablet Take 500 mg by mouth daily as needed (immunity).     . Hyaluronic Acid 20-60 MG CAPS Take 2 capsules by mouth daily. (Patient not taking: Reported on 03/21/2018) 30 each   . Misc Natural Products (ACAI+SUPERFRUIT/GREEN TEA) TABS Take 1 tablet by mouth daily.     No current facility-administered medications for this encounter.     Physical Findings: The patient is in no acute distress. Patient is alert and oriented.  height is _0  (1.575 m) and weight is 157 lb 8 oz (71.4 kg). Her oral temperature is 98.3 F (36.8 C). Her blood pressure is 148/81 (abnormal) and her pulse is 115 (abnormal). Her respiration is 18 and oxygen saturation is 99%. .  No significant changes. Lungs are clear to auscultation bilaterally. Heart has regular rate and rhythm. No palpable cervical, supraclavicular, or axillary adenopathy. Abdomen soft, non-tender, normal bowel sounds. Left breast no dominant mass, nipple discharge or bleeding. Right breast with mild erythema around the nipple areolar complex area from her skin folds created by her lumpectomy scar. Pt has significant architectural distortion, with the nipple pulled up superiorly. No skin breakdown, dominant mass, nipple discharge, or bleeding.     Lab Findings: Lab Results  Component Value Date   WBC 4.8 01/17/2018   HGB 15.0 01/17/2018   HCT 46.4 (H) 01/17/2018   MCV 94.1 01/17/2018   PLT 233 01/17/2018    Radiographic Findings: No results found.  Impression: No evidence recurrence on clinical exam. Patient brought up the issue of plastic surgery reconstruction. She has met with Dr. Iran Planas concerning this, but she is unsure if she would like to pursue it at this time. She may pursue outside consultation at one of the academic centers at some point.   Plan:  PRN follow-up in radiation oncology . She will continue close  follow-up with medical oncology and go through Survivorship later this month.   ____________________________________   [name], PhD, MD   This document serves as a record of services personally performed by Gery Pray, MD. It was created on his behalf by Mary-Margaret Loma Messing, a trained medical scribe. The creation of this record is based on the scribe's personal observations and the provider's statements to them. This document has been checked and approved by the attending provider.

## 2018-03-21 NOTE — Addendum Note (Signed)
Encounter addended by: Gery Pray, MD on: 03/21/2018 11:02 PM  Actions taken: Follow-up modified, LOS modified

## 2018-03-21 NOTE — Progress Notes (Signed)
Pt presents today for f/u with Dr. Sondra Come. Pt is unaccompanied. Pt reports fatigue is improved but not at baseline yet. Pt denies c/o pain. Pt is not using any special moisturizer on breast. Pt reports breast is still hyperpigmented. Pt reports skin is more "flexible".   BP (!) 148/81 (BP Location: Left Arm, Patient Position: Sitting)   Pulse (!) 115   Temp 98.3 F (36.8 C) (Oral)   Resp 18   Ht 5\' 2"  (1.575 m)   Wt 157 lb 8 oz (71.4 kg)   LMP 06/22/2011   SpO2 99%   BMI 28.81 kg/m   Wt Readings from Last 3 Encounters:  03/21/18 157 lb 8 oz (71.4 kg)  01/17/18 154 lb (69.9 kg)  12/13/17 155 lb (70.3 kg)   Loma Sousa, RN BSN

## 2018-03-23 ENCOUNTER — Telehealth: Payer: Self-pay

## 2018-03-23 NOTE — Telephone Encounter (Signed)
LVM for patient reminding of SCP visit with NP on 04/01/18 at 2 pm.  Center number [facility] if patient has questions.

## 2018-04-01 ENCOUNTER — Encounter: Payer: Medicaid Other | Admitting: Adult Health

## 2018-06-01 NOTE — Assessment & Plan Note (Signed)
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 T3N0 stage IIIb  01/19/2017:Right breast 10 x 8.3 x 6.9 cm area of cancer involving all 4 quadrants with skin involvement extends from nipple to pectoralis muscle and involving right axillary lymph nodes, right internal mammary nodes  01/22/2017: CT CAP: 1.9 cm dome of the right hepatic lobe suspicious for liver metastases, 9 mm groundglass nodule in the right lung apex could be inflammation versus cancer Liver MRI: Lesion is benign involuted cyst  Treatment plan: 1.Neoadj chemo with dose dense AC foll by Taxoland carboplatin1/17/19- 06/24/17 2.right lumpectomy 08/17/2017 3. Adjuvant Xeloda with radiation9/12/2017-11/08/2017  Patient did not want to go on either adjuvant Xeloda or immunotherapy  -------------------------------------------------------------------------------------------------------------------------------------------------- CT chest abdomen pelvis 12/06/2017: No evidence of metastatic disease. Breast cancer surveillance:

## 2018-06-02 ENCOUNTER — Telehealth: Payer: Self-pay | Admitting: Hematology and Oncology

## 2018-06-02 NOTE — Telephone Encounter (Signed)
Called patient regarding upcoming [facility] appointment, per patient's request this appointment will be a [facility] call.

## 2018-06-03 ENCOUNTER — Telehealth: Payer: Self-pay | Admitting: Hematology and Oncology

## 2018-06-06 NOTE — Progress Notes (Addendum)
HEMATOLOGY-ONCOLOGY DOXIMITY VISIT PROGRESS NOTE  I connected with Kathryn [name] on 06/07/2018 at  3:15 PM EDT by Doximity video conference and verified that I am speaking with the correct person using two identifiers.  I discussed the limitations, risks, security and privacy concerns of performing an evaluation and management service by Doximity and the availability of in person appointments.  I also discussed with the patient that there may be a patient responsible charge related to this service. The patient expressed understanding and agreed to proceed.  Patient's Location: Home Physician Location: Clinic  CHIEF COMPLIANT: Surveillance of triple negative right breast cancer   INTERVAL HISTORY: Kathryn [name] is a 58 y.o. female with above-mentioned history of triple negative right breast cancer treated with neoadjuvant chemotherapy, lumpectomy, radiation, and who is currently on surveillance. She presents over Doximity for 67-monthfollow-up.     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  T3N2 stage III    01/19/2017 Breast MRI    Right breast 10 x 8.3 x 6.9 cm area of cancer involving all 4 quadrants with skin involvement extends from nipple to pectoralis muscle and involving right axillary lymph nodes, right internal mammary nodes    01/22/2017 Imaging    CT chest abdomen pelvis reveals a 6 cm right breast mass, mild right axillary lymphadenopathy, 1.9 cm peripheral enhancing lesion in the dome of the right hepatic lobe, 9 mm groundglass nodule right lung apex    01/28/2017 - 06/24/2017 Neo-Adjuvant  Chemotherapy    Dose dense Adriamycin and Cytoxan  followed with Taxol and carboplatin    08/11/2017 Surgery    Right lumpectomy: IDC, grade 3, 2.9 cm, margins negative, isolated tumor cells and 2/19 lymph nodes, ER 0%, PR 0%, HER-2 negative ratio 1.1. ypT2N0 (I+)    08/25/2017 Cancer Staging    Staging form: Breast, AJCC 8th Edition - Pathologic: No Stage Recommended (ypT2, pN0(i+), cM0, G3, ER-, PR-, HER2-) - Signed by CGardenia Phlegm NP on 08/25/2017    09/23/2017 - 11/08/2017 Radiation Therapy    Adjuvant radiation therapy with Xeloda     REVIEW OF SYSTEMS:   Constitutional: Denies fevers, chills or abnormal weight loss Eyes: Denies blurriness of vision Ears, nose, mouth, throat, and face: Denies mucositis or sore throat Respiratory: Denies cough, dyspnea or wheezes Cardiovascular: Denies palpitation, chest discomfort Gastrointestinal:  Denies nausea, heartburn or change in bowel habits Skin: Denies abnormal skin rashes Lymphatics: Denies new lymphadenopathy or easy bruising Neurological:Denies numbness, tingling or new weaknesses Behavioral/Psych: Mood is stable, no new changes  Extremities: No lower extremity edema Breast: Slight discomfort as she extends her arm above the shoulder level. All other systems were reviewed with the patient and are negative.  Observations/Objective:  There were no vitals filed for this visit. There is no height or weight on file to calculate BMI.  I have reviewed the data as listed CMP Latest Ref Rng & Units 06/24/2017 06/18/2017 06/17/2017  Glucose 70 - 140 mg/dL 115 153(H) 160(H)  BUN 7 - 26 mg/dL '10 7 10  '$ Creatinine 0.60 - 1.10 mg/dL 0.74 0.80 0.82  Sodium 136 - 145 mmol/L 140  140 139  Potassium 3.5 - 5.1 mmol/L 3.4(L) 3.1(L) 3.1(L)  Chloride 98 - 109 mmol/L 105 102 100  CO2 22 - 29 mmol/L '25 24 25  '$ Calcium 8.4 - 10.4 mg/dL 8.8 9.4 9.3  Total Protein 6.4 - 8.3 g/dL 6.4 6.6 6.8  Total Bilirubin 0.2 - 1.2 mg/dL 0.3 0.3 0.3  Alkaline  Phos 40 - 150 U/L 99 98 98  AST 5 - 34 U/L '22 21 23  '$ ALT 0 - 55 U/L '24 23 23    '$ Lab Results  Component Value Date   WBC 4.8 01/17/2018   HGB 15.0 01/17/2018   HCT 46.4 (H) 01/17/2018   MCV 94.1 01/17/2018   PLT 233 01/17/2018   NEUTROABS 3.2 06/24/2017      Assessment Plan:  Malignant neoplasm of upper-outer quadrant of right breast in female, estrogen receptor negative ([place]) 08/11/2017:Right lumpectomy: IDC, grade 3, 2.9 cm, margins negative, isolated tumor cells and 2/19 lymph nodes, ER 0%, PR 0%, HER-2 negative ratio 1.1Y PT to wipe the N0 (I+)  12/29/2016: Palpable right breast lump for 6 months, mammogram revealed 6 cm mass at 1 o'clock position 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 T3N0 stage IIIb  01/19/2017:Right breast 10 x 8.3 x 6.9 cm area of cancer involving all 4 quadrants with skin involvement extends from nipple to pectoralis muscle and involving right axillary lymph nodes, right internal mammary nodes  01/22/2017: CT CAP: 1.9 cm dome of the right hepatic lobe suspicious for liver metastases, 9 mm groundglass nodule in the right lung apex could be inflammation versus cancer Liver MRI: Lesion is benign involuted cyst  Treatment plan: 1.Neoadj chemo with dose dense AC foll by Taxoland carboplatin1/17/19- 06/24/17 2.right lumpectomy 08/17/2017 3. Adjuvant Xeloda with radiation9/12/2017-11/08/2017  Patient did not want to go on either adjuvant Xeloda or immunotherapy  -------------------------------------------------------------------------------------------------------------------------------------------------- CT chest abdomen pelvis 12/06/2017: No evidence of metastatic disease. 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 The patient was provided an opportunity to ask questions and all were answered. The patient agreed with the plan and demonstrated an understanding of the instructions. The patient was advised to call back or seek an in-person evaluation if the symptoms worsen or if the condition fails to improve as anticipated.   I provided 15 minutes of face-to-face Doximity time during this encounter.    Rulon Eisenmenger, MD 06/07/2018   I, Molly Dorshimer, am acting as scribe for Nicholas Lose, MD.  I have reviewed the above documentation for accuracy and completeness, and I agree with the above.

## 2018-06-07 ENCOUNTER — Inpatient Hospital Stay: Payer: Medicaid Other | Attending: Hematology and Oncology | Admitting: Hematology and Oncology

## 2018-06-07 ENCOUNTER — Other Ambulatory Visit: Payer: Medicaid Other

## 2018-06-07 DIAGNOSIS — Z9221 Personal history of antineoplastic chemotherapy: Secondary | ICD-10-CM | POA: Diagnosis not present

## 2018-06-07 DIAGNOSIS — Z923 Personal history of irradiation: Secondary | ICD-10-CM | POA: Diagnosis not present

## 2018-06-07 DIAGNOSIS — Z171 Estrogen receptor negative status [ER-]: Secondary | ICD-10-CM | POA: Diagnosis not present

## 2018-06-07 DIAGNOSIS — C50411 Malignant neoplasm of upper-outer quadrant of right female breast: Secondary | ICD-10-CM

## 2018-06-16 ENCOUNTER — Encounter: Payer: Self-pay | Admitting: Hematology and Oncology

## 2018-07-05 ENCOUNTER — Other Ambulatory Visit: Payer: Self-pay

## 2018-07-05 MED ORDER — CLONAZEPAM 0.5 MG PO TABS: 0.5000 mg | ORAL_TABLET | Freq: Three times a day (TID) | ORAL | 5 refills | Status: DC | PRN

## 2018-07-05 NOTE — Telephone Encounter (Signed)
[place] Database Verified LR: 03/20/2018 Qty: 90 Pending appointment: 11/07/2018

## 2018-09-14 ENCOUNTER — Telehealth: Payer: Self-pay | Admitting: *Deleted

## 2018-09-14 NOTE — Telephone Encounter (Signed)
Received call from pt with hx of right lumpectomy requesting [facility] and bra's.  Scrip filled out, signed by Dr. Lindi Adie and faxed second to [facility].

## 2018-10-21 ENCOUNTER — Encounter: Payer: Self-pay | Admitting: Registered"

## 2018-10-21 ENCOUNTER — Other Ambulatory Visit: Payer: Self-pay

## 2018-10-21 ENCOUNTER — Encounter: Payer: Medicaid Other | Attending: Physician Assistant | Admitting: Registered"

## 2018-10-21 DIAGNOSIS — E119 Type 2 diabetes mellitus without complications: Secondary | ICD-10-CM | POA: Diagnosis present

## 2018-10-21 NOTE — Patient Instructions (Signed)
Since you are interested in taking supplements, you may be interested in looking into [email or web address] Aim to eat balanced meals and snacks, eating protein with carbs. Continue with exercise, getting at least 3 times per week. Continue watching your beverages for carbohydrate content

## 2018-10-21 NOTE — Progress Notes (Signed)
Diabetes Self-Management Education  Visit Type: First/Initial  Appt. Start Time: 1025 Appt. End Time: 1150  10/21/2018  [name], identified by name and date of birth, is a 58 y.o. female with a diagnosis of Diabetes: Type 2.   ASSESSMENT  Height '5\' 2"'$  (1.575 m), weight 153 lb 6.4 oz (69.6 kg), last menstrual period 06/22/2011. Body mass index is 28.06 kg/m.   Pt states she asked her MD if it was okay if she waited until she made some lifestyle changes and met with dietitian before starting medications.   Pt states changes she has made so far include consuming more whole grains, eating less cheese, stopped drinking juice and sweet tea. Pt states she used to exercise a lot before her cancer treatments, and has just started back walking recently. Patient states she also tries to choose organic options.  Pt states she takes fish oil x3 months after going through cancer treaments. Pt states she eats fish, not every week but  >3x/per month.  Pt states she lives alone and was relying on convenience foods such as frozen dinners and fast food but has cut those out.   Pt states she checked her blood pressure at home and when she saw it was high started [place] berry supplement and saw an immediate reduction, back down to the normal range.  RD discussed supplement use considerations including safety and efficacy.  Diabetes Self-Management Education - 10/21/18 1033      Visit Information   Visit Type  First/Initial      Initial Visit   Diabetes Type  Type 2    Are you currently following a meal plan?  Yes    What type of meal plan do you follow?  whole grains, organic vegetables and meat    Are you taking your medications as prescribed?  Not on Medications    Date Diagnosed  Aug 2020      Health Coping   How would you rate your overall health?  Excellent      Psychosocial Assessment   Patient Belief/Attitude about Diabetes  Motivated to manage diabetes    How often do you  need to have someone help you when you read instructions, pamphlets, or other written materials from your doctor or pharmacy?  1 - Never    What is the last grade level you completed in school?  12      Complications   Last HgB A1C per patient/outside source  6.7 %   per referral   How often do you check your blood sugar?  0 times/day (not testing)    Have you had a dilated eye exam in the past 12 months?  No    Have you had a dental exam in the past 12 months?  No    Are you checking your feet?  Yes    How many days per week are you checking your feet?  7      Dietary Intake   Breakfast  protein pancakes with agave syrup, [place] bacon OR eggs, wheat toast, [place] bacon OR oatmeal.    Snack (morning)  fruit OR rice rolls berries OR chips & salsa    Lunch  SW chick-fil-a salad OR salad with fruit & nuts OR left overs, cheese toast    Snack (afternoon)  none OR nuts    Dinner  whole grain pasta (rice, quinoa, corn) OR banana, mayo sandwich OR salmon, broccoli, green beans    Snack (evening)  second helping  dinner OR cheerios OR fruit OR keto friendly ice cream    Beverage(s)  water, decaf coffee sugar-free creamer, unsweet tea w truvia, carrot/tumeric/ginger      Exercise   Exercise Type  Light (walking / raking leaves)    How many days per week to you exercise?  3    How many minutes per day do you exercise?  45    Total minutes per week of exercise  135      Patient Education   Previous Diabetes Education  No    Disease state   Definition of diabetes, type 1 and 2, and the diagnosis of diabetes    Nutrition management   Role of diet in the treatment of diabetes and the relationship between the three main macronutrients and blood glucose level;Food label reading, portion sizes and measuring food.    Physical activity and exercise   Role of exercise on diabetes management, blood pressure control and cardiac health.    Medications  Reviewed patients medication for diabetes, action,  purpose, timing of dose and side effects.   discussed action of metformin even though not taking   Monitoring  Identified appropriate SMBG and/or A1C goals.      Individualized Goals (developed by patient)   Nutrition  General guidelines for healthy choices and portions discussed    Physical Activity  Exercise 3-5 times per week      Outcomes   Expected Outcomes  Demonstrated interest in learning. Expect positive outcomes    Future DMSE  PRN    Program Status  Completed       Individualized Plan for Diabetes Self-Management Training:   Learning Objective:  Patient will have a greater understanding of diabetes self-management. Patient education plan is to attend individual and/or group sessions per assessed needs and concerns.   Patient Instructions  Since you are interested in taking supplements, you may be interested in looking into [email or web address] Aim to eat balanced meals and snacks, eating protein with carbs. Continue with exercise, getting at least 3 times per week. Continue watching your beverages for carbohydrate content   Expected Outcomes:  Demonstrated interest in learning. Expect positive outcomes  Education material provided: ADA - How to Thrive: A Guide for Your Journey with Diabetes, A1C conversion sheet and Carbohydrate counting sheet  If problems or questions, patient to contact team via:  Phone  Future DSME appointment: PRN

## 2018-11-02 ENCOUNTER — Encounter: Payer: Self-pay | Admitting: General Practice

## 2018-11-02 NOTE — Progress Notes (Signed)
[facility] CSW Progress Notes  Call from patient, wonders if her BCCCP Medicaid can be extended, aware that it will run out soon.  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  Has no income at present.  Provided contact information for BCCCP program at [facility] and also asked that this program reach out to patient.  Discussed option of referral to [place] to investigate options on the [employer].  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  Also advised patient call [place] to determine if she is still eligible for their [place] help and also discussed option of requesting help from [employer].   Edwyna Shell, LCSW Clinical Social Worker Phone:  [phone]

## 2018-11-07 ENCOUNTER — Ambulatory Visit: Payer: Medicaid Other | Admitting: Neurology

## 2018-11-25 ENCOUNTER — Encounter: Payer: Self-pay | Admitting: General Practice

## 2018-11-25 NOTE — Progress Notes (Signed)
[facility] CSW Progress Notes  Staff message from [employer], states she will contact patient and provider to determine if patient meets eligibility requirements for recertification of BCCCP Medicaid.  Edwyna Shell, LCSW Clinical Social Worker Phone:  [phone]

## 2018-11-28 ENCOUNTER — Telehealth (HOSPITAL_COMMUNITY): Payer: Self-pay | Admitting: *Deleted

## 2018-11-28 NOTE — Telephone Encounter (Signed)
Attempted to call patient to discuss BCCCP Medicaid. No one answered the phone. Left voicemail for patient to call me back.

## 2018-12-02 ENCOUNTER — Telehealth (HOSPITAL_COMMUNITY): Payer: Self-pay | Admitting: *Deleted

## 2018-12-02 NOTE — Telephone Encounter (Signed)
Patient returned my call and left voicemail. Called patient back. Patient is not currently in active treatment, therefore unable to renew her BCCCP Medicaid. Verified with patient and explained BCCCP Medicaid. Told patient she can come to [facility] next year when due for her yearly mammogram if still eligible to have yearly mammogram paid by BCCCP. Gave patient phone number for BCCCP to schedule appointment. Patient verbalized understanding.

## 2019-01-08 IMAGING — CT CT ABD-PELV W/ CM
2 of 5 series · 12 of 36 positions shown, 15 images · IV contrast (omnipaque)
Comparison: Abdominal MRI of 02/03/2017. Chest abdomen and pelvic
CTs of 01/22/2017.

CLINICAL DATA: Right breast cancer. Just completed radiation
therapy 3 weeks ago. Re-evaluate lung nodule.

EXAM:
CT CHEST, ABDOMEN, AND PELVIS WITH CONTRAST
TECHNIQUE: Multidetector CT imaging of the chest, abdomen and pelvis was
performed following the standard protocol during bolus
administration of intravenous contrast.
CONTRAST:  100mL OMNIPAQUE IOHEXOL 300 MG/ML  SOLN

[Series 2: cap with · axial · 0.89mm/px · z∈[-32,+498]mm · 9 of 134 slices shown, 12 images]
[im 14/134  mediastinal]
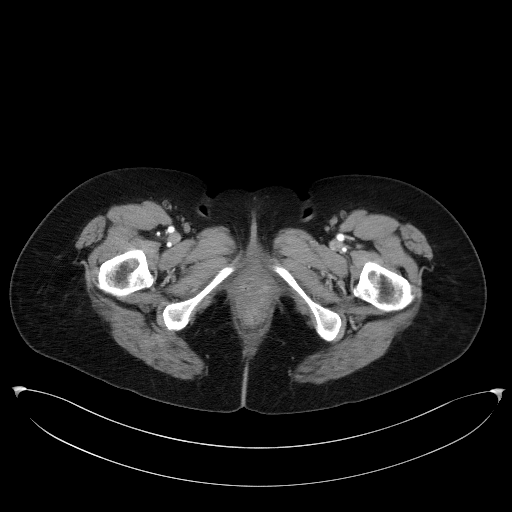
[im 14/134  lung]
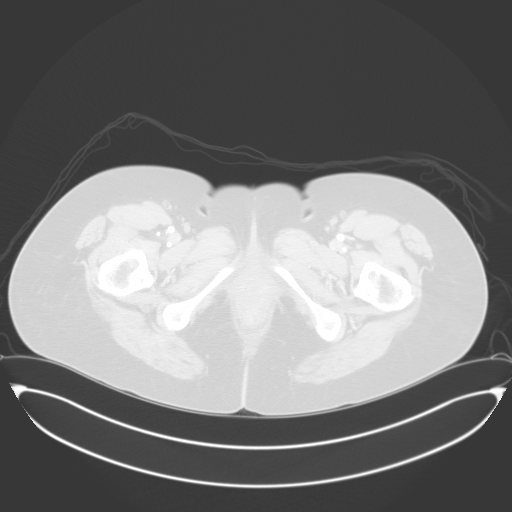
[im 27/134  lung]
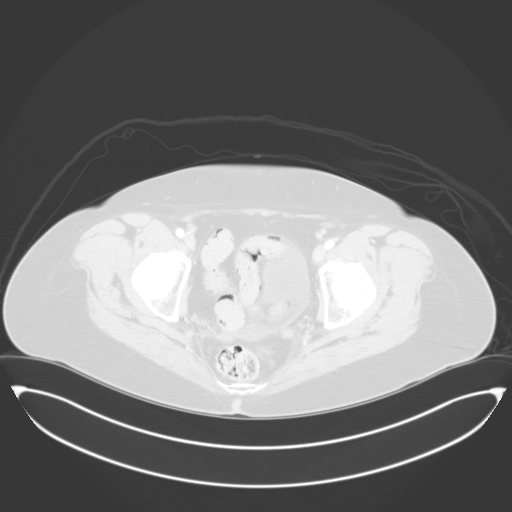
[im 40/134  lung]
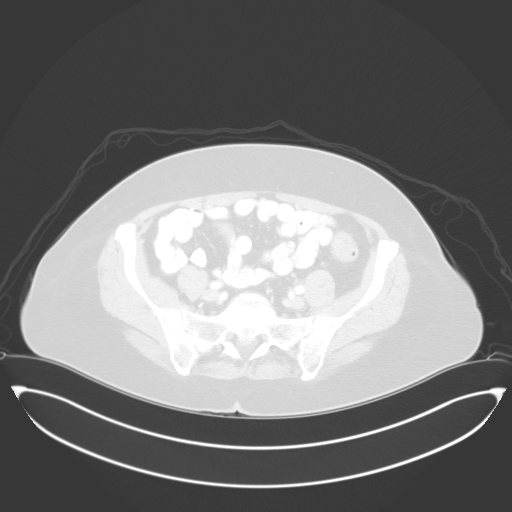
[im 54/134  lung]
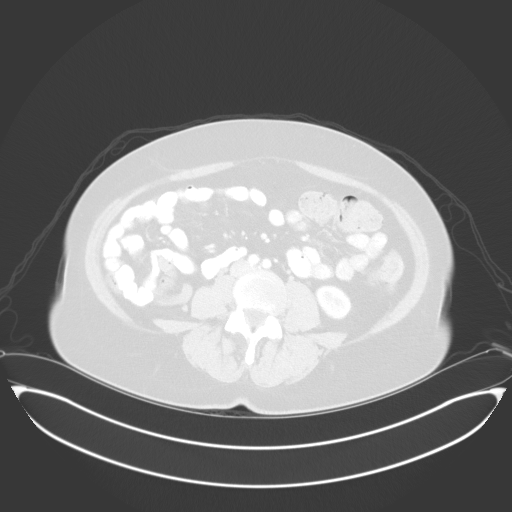
[im 67/134  mediastinal]
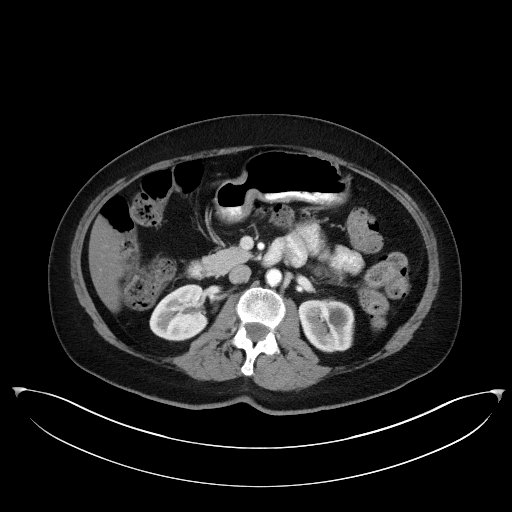
[im 67/134  lung]
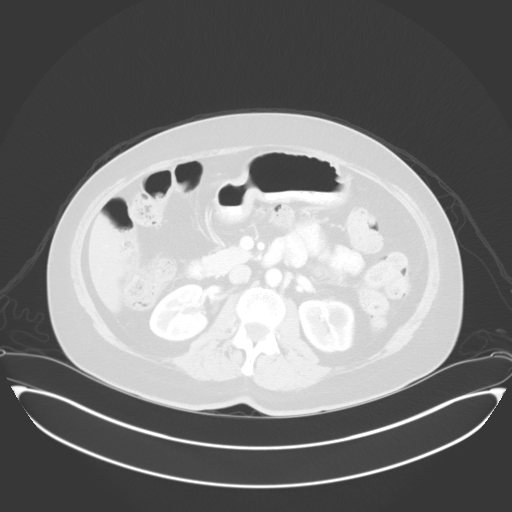
[im 80/134  lung]
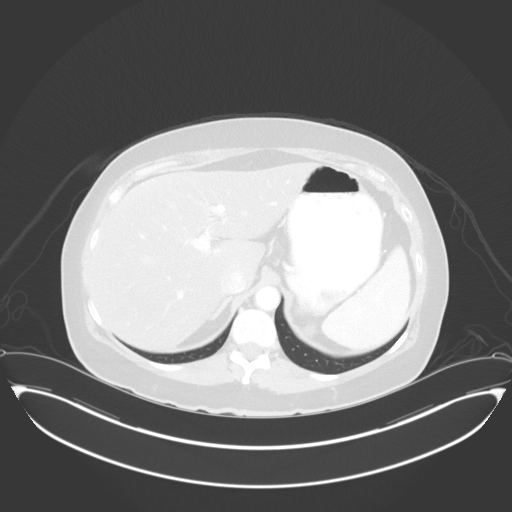
[im 94/134  lung]
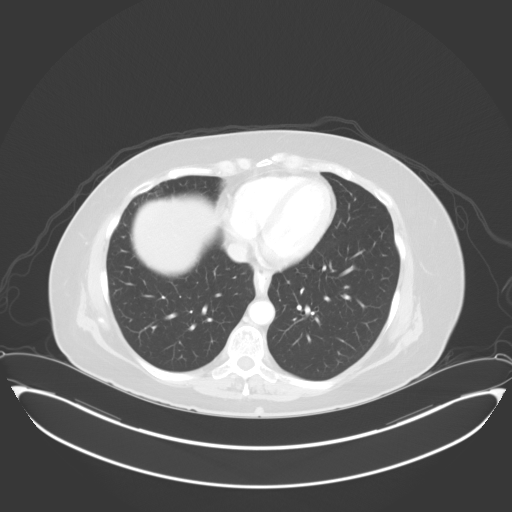
[im 107/134  lung]
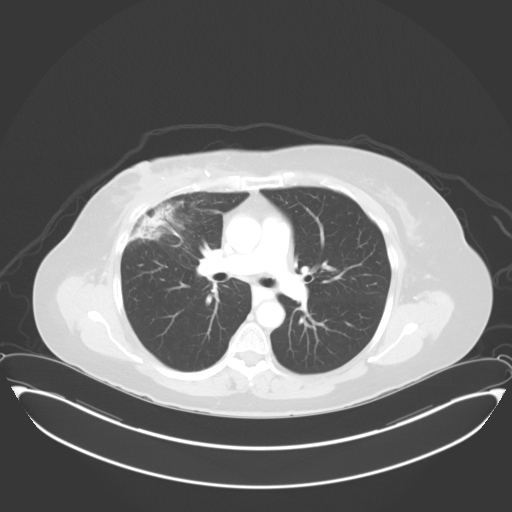
[im 120/134  mediastinal]
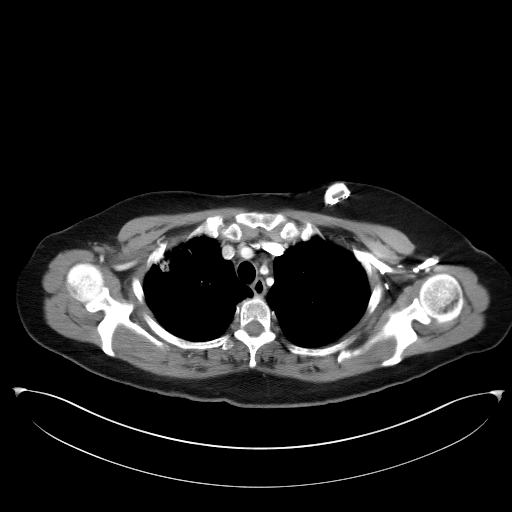
[im 120/134  lung]
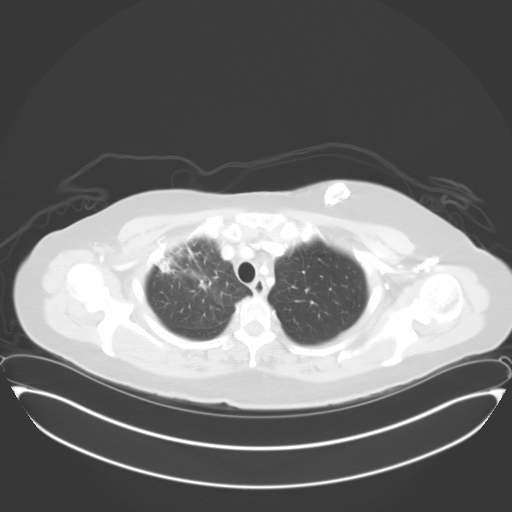

[Series 6: coronals · coronal · 0.82mm/px · 3 of 149 slices shown]
[im 30/149  lung]
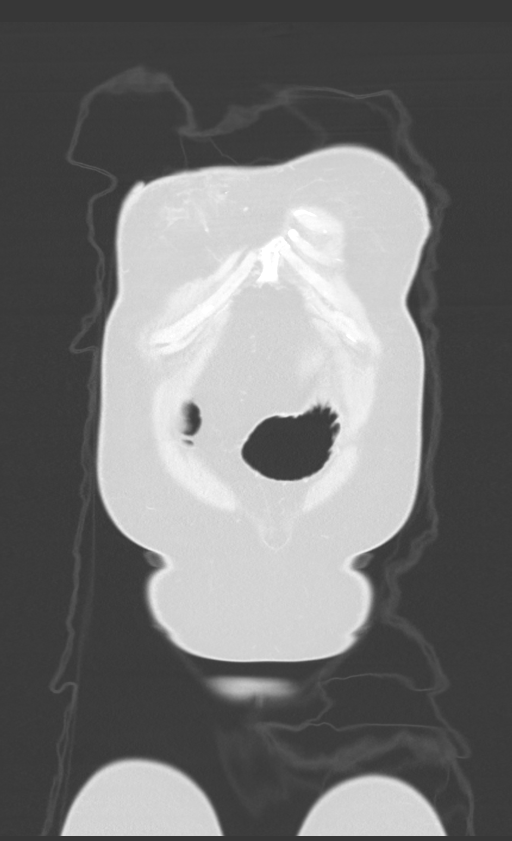
[im 60/149  lung]
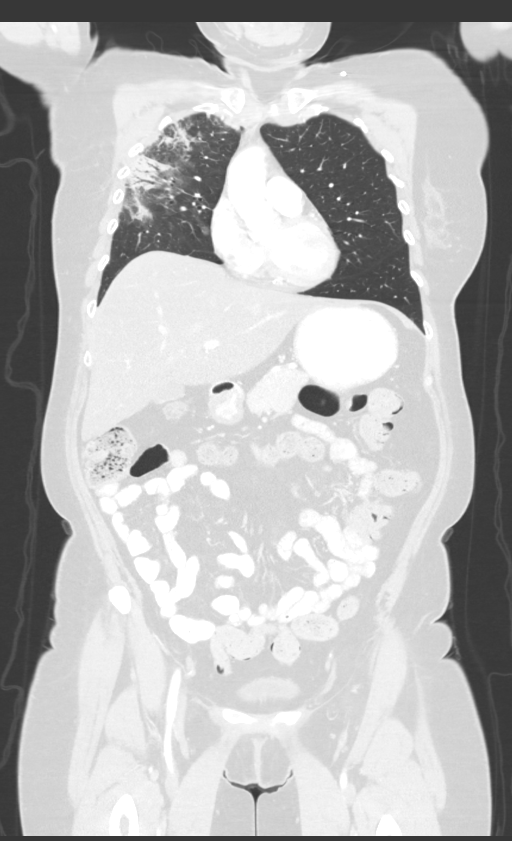
[im 89/149  lung]
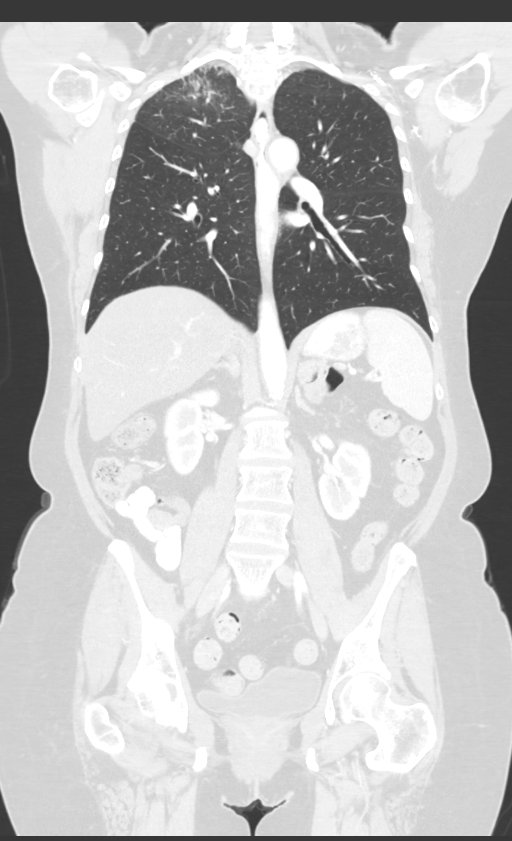

[12 of 36 positions shown; findings below may reference images not displayed]

FINDINGS: CT CHEST FINDINGS

Cardiovascular: Aortic and branch vessel atherosclerosis. Normal
heart size, without pericardial effusion. No central pulmonary
embolism, on this non-dedicated study.

Mediastinum/Nodes: No supraclavicular adenopathy. Interval right
axillary node dissection. No residual axillary adenopathy. No
subpectoral adenopathy. No mediastinal or hilar adenopathy. The
esophagus is contrast filled and mildly dilated, including on [DATE].
No internal mammary adenopathy.

Lungs/Pleura: No pleural fluid. Development of right upper lobe and
apical ground-glass and airspace disease. This presumably obscures
the previously described 9 mm right apical pulmonary nodule.

Clear left lung.

Musculoskeletal: Interval right mastectomy. No acute osseous
abnormality.

CT ABDOMEN PELVIS FINDINGS

Hepatobiliary: Marked hepatic steatosis. The high right hepatic lobe
hypoattenuating lesion measures 8 mm on image 46/2 and is less
conspicuous than at 1.2 cm on the prior exam (when remeasured).
Lateral segment left liver lobe hyperattenuating or hyperenhancing
lesion is similar to on the prior

Normal gallbladder, without biliary ductal dilatation.

Pancreas: Normal, without mass or ductal dilatation.

Spleen: Normal in size, without focal abnormality.

Adrenals/Urinary Tract: Normal adrenal glands. Upper pole left renal
exophytic cyst of 3.5 cm. Normal right kidney. Normal urinary
bladder.

Stomach/Bowel: Normal stomach, without wall thickening. Colonic
stool burden suggests constipation. Normal terminal ileum and
appendix. Normal small bowel.

Vascular/Lymphatic: Aortic and branch vessel atherosclerosis. No
abdominopelvic adenopathy.

Reproductive: Hysterectomy.  No adnexal mass.

Other: No significant free fluid.  Mild pelvic floor laxity.

Musculoskeletal: Disc bulge at the lumbosacral junction. No acute
osseous abnormality.
IMPRESSION: 1. Interval right mastectomy and axillary node dissection. No
evidence of residual or metastatic disease.
2. New right upper lobe and apical ground-glass and airspace
disease, radiation induced. This presumably obscures the right
apical ground-glass nodule described on the prior exam.
3. High right hepatic lobe hypoattenuating lesion is decreased
today, again favored to represent involution of a cyst. There is a
lateral segment left liver lobe focus of hyperenhancement which is
similar to on the prior exam, and when correlated with the
02/03/2017 MRI, may represent an area of focal nodular hyperplasia.
Recommend attention on follow-up.
4.  Aortic and branch vessel atherosclerosis.
5. Esophageal air fluid level suggests dysmotility or
gastroesophageal reflux.
6. Hepatic steatosis.
7.  Possible constipation.

## 2019-06-07 ENCOUNTER — Inpatient Hospital Stay: Payer: Medicaid Other | Attending: Hematology and Oncology | Admitting: Hematology and Oncology

## 2019-07-03 ENCOUNTER — Other Ambulatory Visit: Payer: Self-pay | Admitting: Pharmacist

## 2019-10-26 ENCOUNTER — Telehealth: Payer: Self-pay | Admitting: Neurology

## 2019-10-26 NOTE — Telephone Encounter (Signed)
Pt called needing a refill on her clonazePAM (KLONOPIN) 0.5 MG tablet sent in to the [facility] on [address]

## 2019-10-26 NOTE — Telephone Encounter (Signed)
The patient was last seen on 11/01/2017. She was instructed to follow up in one year. No appointments pending. The prescription request is for a controlled substance. She would need to schedule a follow up prior to [place] refilling this type of medication.    I called the patient and left a message advising that she would need to be seen in order for the refill request to be approved. She may also discuss this prescription management with her PCP. Provided our number to call back if an appt is desired.   Per [place] narcotic registry, the last refill was provided on 07/05/2018 for #[id number].

## 2020-04-04 ENCOUNTER — Ambulatory Visit: Payer: Medicaid Other | Admitting: Neurology

## 2020-04-25 ENCOUNTER — Ambulatory Visit: Payer: Medicaid Other | Admitting: Neurology

## 2020-04-25 ENCOUNTER — Encounter: Payer: Self-pay | Admitting: Neurology

## 2020-04-25 VITALS — BP 152/92 | HR 101 | Ht 62.0 in | Wt 143.5 lb

## 2020-04-25 DIAGNOSIS — G243 Spasmodic torticollis: Secondary | ICD-10-CM

## 2020-04-25 MED ORDER — CLONAZEPAM 0.5 MG PO TABS: 1.0000 mg | ORAL_TABLET | ORAL | 5 refills | Status: DC | PRN
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
## 2020-04-27 NOTE — Addendum Note (Signed)
Addended by: Marcial Pacas on: 04/27/2020 10:16 PM   Modules accepted: Level of Service

## 2020-04-30 ENCOUNTER — Telehealth: Payer: Self-pay | Admitting: Neurology

## 2020-04-30 NOTE — Telephone Encounter (Signed)
Received charge sheet for 200 units of Xeomin for cervical dystonia. Per Billing, patient's insurance is not in network. Her insurance will not pay [place].

## 2020-04-30 NOTE — Telephone Encounter (Addendum)
Please let patient know, please also guide her through xeomin patient assistant program,   If she  could not get enough help through the program, may refer her to her network program that are better covered by her insurance

## 2020-05-02 NOTE — Telephone Encounter (Signed)
I called patient. Explained that her insurance is out of network. She states that she will be looking into other insurance plans. I gave her two options that we take. Medicaid [employer] Access and [place]. She states she will call back once she figures everything out. Gave signed patient consent form for Xeomin to medical records.   Patient would be getting 200 units of Xeomin (V4008) for G24.3 ([id number], [id number]).

## 2020-05-13 ENCOUNTER — Telehealth: Payer: Self-pay | Admitting: Neurology

## 2020-05-13 ENCOUNTER — Other Ambulatory Visit: Payer: Self-pay | Admitting: Neurology

## 2020-05-13 DIAGNOSIS — G243 Spasmodic torticollis: Secondary | ICD-10-CM

## 2020-05-13 NOTE — Telephone Encounter (Signed)
Pt states she took her last 2 clonazePAM (KLONOPIN) 0.5 MG tablet  this morning.  Pt being told she has to wait until 05-10 but pt is out, please call.  Pt [id number]

## 2020-05-13 NOTE — Telephone Encounter (Signed)
[place] narcotic registry checked. The patient received #30 on 04/24/20 from her PCP with the instructions to take one tablet daily. She was seen in our clinic on 04/25/20. Her dose was increased to two tablets daily. She is out early due to the increase. The pharmacy has a post-dated rx on hold and will fill it for her today (spoke to pharmacist, Clair Gulling). The patient has been notified.

## 2020-05-13 NOTE — Telephone Encounter (Signed)
Ok to keep her on clonazepam 0.5mg  bid prn 65 tablets each month, with no earlier refill

## 2020-11-04 ENCOUNTER — Other Ambulatory Visit: Payer: Self-pay | Admitting: Neurology

## 2020-11-04 DIAGNOSIS — G243 Spasmodic torticollis: Secondary | ICD-10-CM

## 2020-12-05 ENCOUNTER — Other Ambulatory Visit: Payer: Self-pay | Admitting: Neurology

## 2020-12-05 DIAGNOSIS — G243 Spasmodic torticollis: Secondary | ICD-10-CM

## 2020-12-10 NOTE — Telephone Encounter (Signed)
Pt needs appt for refills

## 2020-12-11 ENCOUNTER — Other Ambulatory Visit: Payer: Self-pay | Admitting: Neurology

## 2020-12-11 DIAGNOSIS — G243 Spasmodic torticollis: Secondary | ICD-10-CM

## 2020-12-12 ENCOUNTER — Telehealth: Payer: Self-pay | Admitting: *Deleted

## 2020-12-12 NOTE — Telephone Encounter (Signed)
The patient called today asking for refills of clonazepam. I returned the call. See below. ___________________________________  The patient was last seen 04/26/20. The plan was to move forward with Xeomin injections. Lovena Le had requested the patient to call back after researching plans.   States she received a letter stating she had active [place] Medicaid (GN#[id number] P). She did not call our office to provide this information. She is on the fence about doing Xeomin injections but would like to see if they can get approved.  She has been using clonazepam 0.5mg , #65 tabs per month. 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 The stress makes her cervical dystonia worse. Since the stress is the primary problem, she would need to see her PCP or psychiatry to discuss treatment. This plan has been reviewed by Dr. Krista Blue. She is in agreement and even offered to put in a referral to psychiatry.  I called the patient back again. Reports actually not taking the clonazepam since 12/06/20. She started using OTC Ashwanda Relax and it seems to help. No prescription will be sent to the pharmacy for clonazepam from our office. She declined the referral to psychiatry stating she did not feel it was needed. I invited her to call back, if she changes her mind. She verbalized understanding that if she continues care here, it will only be for Xeomin injections.

## 2020-12-19 NOTE — Telephone Encounter (Addendum)
Filled out designated PA form to request Xeomin approval from [place]. Patient would be getting 200 units of Xeomin (V9872) for G24.3 ([id number], [id number]). I faxed this request with last OV notes. Pending approval. [employer]: [phone].

## 2020-12-23 NOTE — Telephone Encounter (Signed)
Received fax from [place]. No Kathryn [name] is required for CPT 64616 or CPT [phone]. CPT [phone] has been requested on a separate form for pharmacy. That is still pending.

## 2020-12-26 NOTE — Telephone Encounter (Signed)
Received approval letter from [employer]. No PA number was included, but PA is approved for 12 months from 12/23/20- 12/23/21. I called PA line @ [phone] and spoke with Amiria to see which specialty pharmacy patient should use. Mardella Layman states patient can use any Oncologist. [employer]'s website states the preferred pharmacy vendor is [facility]. I located prescription form and will fill out & fax it once I speak with patient.  I called patient and LVM advising of approval. Requested she call back if she would like to move forward.

## 2020-12-30 ENCOUNTER — Emergency Department (HOSPITAL_BASED_OUTPATIENT_CLINIC_OR_DEPARTMENT_OTHER): Payer: Medicaid Other

## 2020-12-30 ENCOUNTER — Encounter: Payer: Self-pay | Admitting: Hematology and Oncology

## 2020-12-30 ENCOUNTER — Other Ambulatory Visit: Payer: Self-pay

## 2020-12-30 ENCOUNTER — Encounter (HOSPITAL_BASED_OUTPATIENT_CLINIC_OR_DEPARTMENT_OTHER): Payer: Self-pay

## 2020-12-30 ENCOUNTER — Emergency Department (HOSPITAL_BASED_OUTPATIENT_CLINIC_OR_DEPARTMENT_OTHER)
Admission: EM | Admit: 2020-12-30 | Discharge: 2020-12-30 | Disposition: A | Discharge status: Home / Self Care | Payer: Medicaid Other | Attending: Emergency Medicine | Admitting: Emergency Medicine

## 2020-12-30 DIAGNOSIS — R791 Abnormal coagulation profile: Secondary | ICD-10-CM | POA: Insufficient documentation

## 2020-12-30 DIAGNOSIS — I10 Essential (primary) hypertension: Secondary | ICD-10-CM | POA: Insufficient documentation

## 2020-12-30 DIAGNOSIS — R202 Paresthesia of skin: Secondary | ICD-10-CM | POA: Insufficient documentation

## 2020-12-30 DIAGNOSIS — Z853 Personal history of malignant neoplasm of breast: Secondary | ICD-10-CM | POA: Insufficient documentation

## 2020-12-30 DIAGNOSIS — Z79899 Other long term (current) drug therapy: Secondary | ICD-10-CM | POA: Diagnosis not present

## 2020-12-30 LAB — COMPREHENSIVE METABOLIC PANEL
ALT: 25 U/L (ref 0–44)
AST: 25 U/L (ref 15–41)
Albumin: 4.8 g/dL (ref 3.5–5.0)
Alkaline Phosphatase: 64 U/L (ref 38–126)
Anion gap: 11 (ref 5–15)
BUN: 11 mg/dL (ref 6–20)
CO2: 27 mmol/L (ref 22–32)
Calcium: 9.8 mg/dL (ref 8.9–10.3)
Chloride: 102 mmol/L (ref 98–111)
Creatinine, Ser: 0.67 mg/dL (ref 0.44–1.00)
GFR, Estimated: >60 mL/min (ref >60)
Glucose, Bld: 108 mg/dL — ABNORMAL HIGH (ref 70–99)
Potassium: 3.6 mmol/L (ref 3.5–5.1)
Sodium: 140 mmol/L (ref 135–145)
Total Bilirubin: 0.5 mg/dL (ref 0.3–1.2)
Total Protein: 7.6 g/dL (ref 6.5–8.1)

## 2020-12-30 LAB — DIFFERENTIAL
Abs Immature Granulocytes: 0.02 10*3/uL (ref 0.00–0.07)
Basophils Absolute: 0.1 10*3/uL (ref 0.0–0.1)
Basophils Relative: 1 %
Eosinophils Absolute: 0.1 10*3/uL (ref 0.0–0.5)
Eosinophils Relative: 1 %
Immature Granulocytes: 0 %
Lymphocytes Relative: 18 %
Lymphs Abs: 1.2 10*3/uL (ref 0.7–4.0)
Monocytes Absolute: 0.4 10*3/uL (ref 0.1–1.0)
Monocytes Relative: 6 %
Neutro Abs: 4.8 10*3/uL (ref 1.7–7.7)
Neutrophils Relative %: 74 %

## 2020-12-30 LAB — CBG MONITORING, ED: Glucose-Capillary: 125 mg/dL — ABNORMAL HIGH (ref 70–99)

## 2020-12-30 LAB — CBC
HCT: 50.9 % — ABNORMAL HIGH (ref 36.0–46.0)
Hemoglobin: 17 g/dL — ABNORMAL HIGH (ref 12.0–15.0)
MCH: 29.9 pg (ref 26.0–34.0)
MCHC: 33.4 g/dL (ref 30.0–36.0)
MCV: 89.5 fL (ref 80.0–100.0)
Platelets: 222 10*3/uL (ref 150–400)
RBC: 5.69 MIL/uL — ABNORMAL HIGH (ref 3.87–5.11)
RDW: 13 % (ref 11.5–15.5)
WBC: 6.5 10*3/uL (ref 4.0–10.5)
nRBC: 0 % (ref 0.0–0.2)

## 2020-12-30 LAB — PROTIME-INR
INR: 1 (ref 0.8–1.2)
Prothrombin Time: 13 seconds (ref 11.4–15.2)

## 2020-12-30 LAB — APTT: aPTT: 26 seconds (ref 24–36)

## 2020-12-30 MED ORDER — AMLODIPINE BESYLATE 2.5 MG PO TABS: 2.5000 mg | ORAL_TABLET | Freq: Every day | ORAL | 0 refills | Status: AC

## 2020-12-30 NOTE — ED Triage Notes (Signed)
Patient here POV from Home with HTN, Nausea, and Numbness.  Symptoms have been present for approximately 1 week but have worsened the past 3-4 days. Patient states she also began to have Left Arm Numbness for a few days and it began to radiate to Face yesterday.  NAD Noted during Triage. A&Ox4. GCS 15. BIB Wheelchair. Patient does not take any Daily Medications.

## 2020-12-30 NOTE — ED Provider Notes (Signed)
[facility] EMERGENCY DEPT Provider Note   CSN: [id number] Arrival date & time: 12/30/20  1109     History Chief Complaint  Patient presents with   Hypertension    Kathryn [name] is a 60 y.o. female.   Hypertension   Patient presents to the ER for evaluation of left-sided facial and arm numbness.  Patient states she initially started having symptoms a few days ago.  She noticed some numbness in her left arm.  Patient states prior to that she did have an episode where she felt like something passed through her head rapidly.  Was not actually painful.  Since that time she has had intermittent episodes of her left arm feeling numb.  Today she also started to feel that her face was feeling numb.  She has had some headaches intermittently but not with this right now.  Patient has never had this before.  She had an appointment with her doctor this morning at noon but when the symptoms worsened she decided to come to the ED.  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   Breast cancer, right breast ([place]) 08/10/2017   Genetic testing 07/01/2017   Port-A-Cath in place 02/11/2017   Malignant neoplasm of upper-outer quadrant of right breast in female, estrogen receptor negative ([place]) 01/04/2017   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    Past Surgical History:  Procedure Laterality Date   ABDOMINAL HYSTERECTOMY  06/29/2011   Procedure: HYSTERECTOMY ABDOMINAL;  Surgeon: Selinda Orion, MD;  Location: [facility];  Service: Gynecology;  Laterality: N/A;  PROCEDURE READS: EXPLORATORY LAPAROTOMY, TOTAL ABDOMINAL HYSTERECTOMY WITH BSO  AND  SUPERCERVICAL HYSTERECTOMY WITH BSO AND EXCISION OF ENDOMETRIOMIAS OWER   BENIGN BREAST CYST REMOVED  YRS AGO   BREAST LUMPECTOMY WITH RADIOACTIVE SEED AND AXILLARY LYMPH NODE DISSECTION Right 08/10/2017   Procedure: BREAST LUMPECTOMY WITH RADIOACTIVE SEED (X2)  AND AXILLARY LYMPH NODE DISSECTION;  Surgeon: Fanny Skates, MD;  Location: [place] [facility] [place];  Service: General;  Laterality: Right;   ENDOMETRIAL ABLATION  06/29/2011   Procedure: ENDOMETRIAL ABLATION;  Surgeon: Selinda Orion, MD;  Location: [facility];  Service: Gynecology;  Laterality: N/A;   EXCISION OF A HIDRADENITIS ABSCESS, LEFT INGUINAL AREA  03-30-2005   IR IMAGING GUIDED PORT INSERTION  01/26/2017   IR REMOVAL TUN ACCESS W/ PORT W/O FL MOD SED  01/17/2018   IR US GUIDE VASC ACCESS LEFT  01/26/2017   LAPAROTOMY  06/29/2011   Procedure: EXPLORATORY LAPAROTOMY;  Surgeon: Selinda Orion, MD;  Location: [facility];  Service: Gynecology;  Laterality: N/A;   SALPINGOOPHORECTOMY  06/29/2011   Procedure: SALPINGO OOPHERECTOMY;  Surgeon: Selinda Orion, MD;  Location: [facility];  Service: Gynecology;  Laterality: Bilateral;     OB History     Gravida  3   Para  2   Term  2   Preterm  0   AB  1   Living  2      SAB  1   IAB  0   Ectopic  0  Multiple  0   Live Births              Family History  Problem Relation Age of Onset   Dementia Mother    Diabetes Father    Hypertension Father    Kidney disease Father    Cancer Father    Cancer Maternal Aunt    Heart attack Paternal Aunt    Breast cancer Paternal Aunt    Breast cancer Cousin    Anesthesia problems Neg Hx     Social History   Tobacco Use   Smoking status: Never   Smokeless tobacco: Never  Vaping Use   Vaping Use: Never used  Substance Use Topics   Alcohol use: Yes    Comment: occ   Drug use: No    Home Medications Prior to Admission medications   Medication Sig Start  Date End Date Taking? Authorizing Provider  amLODipine (NORVASC) 2.5 MG tablet Take 1 tablet (2.5 mg total) by mouth daily. 12/30/20  Yes Dorie Rank, MD  cholecalciferol (VITAMIN D) 1000 UNITS tablet Take 1,000 Units by mouth daily.    [provider]  cyclobenzaprine (FLEXERIL) 10 MG tablet TAKE 1 TABLET BY MOUTH THREE TIMES A DAY AS NEEDED 11/04/20   Marcial Pacas, MD  Multiple Vitamin (MULITIVITAMIN WITH MINERALS) TABS Take 1 tablet by mouth daily. Plus vit C 500mg     [provider]  Omega-3 Fatty Acids (OMEGA 3 PO) Take by mouth.    [provider]  UNABLE TO FIND Med Name: Ashwanda Relax    [provider]  vitamin C (ASCORBIC ACID) 250 MG tablet Take 500 mg by mouth daily as needed (immunity).     [provider]    Allergies    Patient has no known allergies.  Review of Systems   Review of Systems  All other systems reviewed and are negative.  Physical Exam Updated Vital Signs BP (!) 140/92    Pulse 87    Temp 98.7 F (37.1 C) (Oral)    Resp 15    Ht 1.575 m (5\' 2" )    Wt 65.1 kg    LMP 06/22/2011    SpO2 99%    BMI 26.25 kg/m   Physical Exam Vitals and nursing note reviewed.  Constitutional:      General: She is not in acute distress.    Appearance: She is well-developed.  HENT:     Head: Normocephalic and atraumatic.     Right Ear: External ear normal.     Left Ear: External ear normal.  Eyes:     General: No scleral icterus.       Right eye: No discharge.        Left eye: No discharge.     Conjunctiva/sclera: Conjunctivae normal.  Neck:     Trachea: No tracheal deviation.  Cardiovascular:     Rate and Rhythm: Normal rate and regular rhythm.  Pulmonary:     Effort: Pulmonary effort is normal. No respiratory distress.     Breath sounds: Normal breath sounds. No stridor. No wheezing or rales.  Abdominal:     General: Bowel sounds are normal. There is no distension.     Palpations: Abdomen is soft.     Tenderness: There  is no abdominal tenderness. There is no guarding or rebound.  Musculoskeletal:        General: No tenderness.     Cervical back: Neck supple.  Skin:    General: Skin is warm  and dry.     Findings: No rash.  Neurological:     Mental Status: She is alert and oriented to person, place, and time.     Cranial Nerves: No cranial nerve deficit (No facial droop, extraocular movements intact, tongue midline ).     Sensory: No sensory deficit.     Motor: No abnormal muscle tone or seizure activity.     Coordination: Coordination normal.     Comments: No pronator drift bilateral upper extrem, able to hold both legs off bed for 5 seconds, sensation intact in all extremities, no visual field cuts, no left or right sided neglect, normal finger-nose exam bilaterally, no nystagmus noted     ED Results / Procedures / Treatments   Labs (all labs ordered are listed, but only abnormal results are displayed) Labs Reviewed  CBC - Abnormal; Notable for the following components:      Result Value   RBC 5.69 (*)    Hemoglobin 17.0 (*)    HCT 50.9 (*)    All other components within normal limits  COMPREHENSIVE METABOLIC PANEL - Abnormal; Notable for the following components:   Glucose, Bld 108 (*)    All other components within normal limits  CBG MONITORING, ED - Abnormal; Notable for the following components:   Glucose-Capillary 125 (*)    All other components within normal limits  PROTIME-INR  APTT  DIFFERENTIAL    EKG EKG Interpretation  Date/Time:  Monday December 30 2020 11:36:11 EST Ventricular Rate:  98 PR Interval:  100 QRS Duration: 64 QT Interval:  342 QTC Calculation: 436 R Axis:   5 Text Interpretation: Sinus rhythm with short PR Otherwise normal ECG Since last tracing rate slower Confirmed by Dorie Rank [phone]) on 12/30/2020 11:41:50 AM  Radiology CT Head Wo Contrast  Result Date: 12/30/2020 CLINICAL DATA:  Neuro deficit, acute, stroke suspected. Left arm numbness. Left  facial numbness. Symptoms over the last week. EXAM: CT HEAD WITHOUT CONTRAST TECHNIQUE: Contiguous axial images were obtained from the base of the skull through the vertex without intravenous contrast. COMPARISON:  None. FINDINGS: Brain: The brain shows a normal appearance without evidence of malformation, atrophy, old or acute small or large vessel infarction, mass lesion, hemorrhage, hydrocephalus or extra-axial collection. Incidental asymmetric physiologic calcification in the basal ganglia on the right. Vascular: No hyperdense vessel. No evidence of atherosclerotic calcification. Skull: Normal.  No traumatic finding.  No focal bone lesion. Sinuses/Orbits: Sinuses are clear. Orbits appear normal. Mastoids are clear. Other: None significant IMPRESSION: Normal head CT. No abnormality seen to explain the presenting symptoms. Electronically Signed   By: Nelson Chimes M.D.   On: 12/30/2020 14:21    Procedures Procedures   Medications Ordered in ED Medications - No data to display  ED Course  I have reviewed the triage vital signs and the nursing notes.  Pertinent labs & imaging results that were available during my care of the patient were reviewed by me and considered in my medical decision making (see chart for details).  Clinical Course as of 12/31/20 1458  Mon Dec 30, 2020  1347 CBC shows a hemoglobin of 17. [JK]  7989 Metabolic panel is normal. [JK]  1436 CT without acute findings [JK]    Clinical Course User Index [JK] Dorie Rank, MD   MDM Rules/Calculators/A&P                         DDs TIA, Stroke, Complex migraine  ED  workup reassuring.   No focal neuro deficit on exam.  Labs without anemia, electrolyte abnormality.  Initially hypertensivfe.  Decreased without intervention.  CT negative for acute process.  Doubt acute CVA.  MRI not availability at this civility but do not feel emergent MRI necessary at this time.  Pt comfortable with this plan and agrees.  Close outpt follow up with  neurologist/PCP    Final Clinical Impression(s) / ED Diagnoses Final diagnoses:  Hypertension, unspecified type  Paresthesia    Rx / DC Orders ED Discharge Orders          Ordered    amLODipine (NORVASC) 2.5 MG tablet  Daily        12/30/20 1510             Dorie Rank, MD 12/31/20 1458

## 2020-12-30 NOTE — Discharge Instructions (Signed)
Follow-up with your neurologist or primary care doctor for further evaluation as we discussed.  Return to the emergency room for worsening symptoms.  Start taking the blood pressure medication if you are measuring blood pressures in the 518D systolic.  Follow-up with your primary care doctor to recheck on your blood pressure.

## 2021-01-17 ENCOUNTER — Telehealth: Payer: Self-pay | Admitting: General Practice

## 2021-01-17 NOTE — Telephone Encounter (Signed)
[place] CSW Progress Notes  Patient called w questions about her Medicaid status, advised her to call numbers provided on notices she received as undersigned CSW does not have the information she is seeking.  She will do this.   Kathryn Shell, LCSW Clinical Social Worker Phone:  [phone]

## 2022-02-16 ENCOUNTER — Encounter: Payer: Self-pay | Admitting: Hematology and Oncology

## 2022-02-19 DIAGNOSIS — E782 Mixed hyperlipidemia: Secondary | ICD-10-CM | POA: Insufficient documentation

## 2022-02-19 DIAGNOSIS — I10 Essential (primary) hypertension: Secondary | ICD-10-CM | POA: Insufficient documentation

## 2022-02-19 DIAGNOSIS — F3341 Major depressive disorder, recurrent, in partial remission: Secondary | ICD-10-CM | POA: Insufficient documentation

## 2022-02-23 ENCOUNTER — Encounter: Payer: Self-pay | Admitting: Hematology and Oncology

## 2022-02-23 ENCOUNTER — Ambulatory Visit: Payer: Medicaid Other | Admitting: Neurology

## 2022-02-23 ENCOUNTER — Encounter: Payer: Self-pay | Admitting: Neurology

## 2022-02-23 VITALS — BP 133/73 | HR 98 | Ht 62.0 in | Wt 141.5 lb

## 2022-02-23 DIAGNOSIS — G243 Spasmodic torticollis: Secondary | ICD-10-CM | POA: Diagnosis not present
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
## 2022-03-09 ENCOUNTER — Telehealth: Payer: Self-pay | Admitting: Neurology

## 2022-03-09 NOTE — Telephone Encounter (Signed)
J code: [id number] EMG guided [id number] Dx code: G24.3

## 2022-03-09 NOTE — Telephone Encounter (Signed)
Preauthorization for xeomin 300 units  Please assist, thanks

## 2022-03-09 NOTE — Telephone Encounter (Signed)
Pt is calling stated she is following up on getting Botox. Requesting a call back from nurse

## 2022-03-11 ENCOUNTER — Other Ambulatory Visit (HOSPITAL_COMMUNITY): Payer: Self-pay

## 2022-03-11 ENCOUNTER — Encounter: Payer: Self-pay | Admitting: Hematology and Oncology

## 2022-03-11 NOTE — Telephone Encounter (Signed)
Pharmacy Patient Advocate Encounter- Botox BIV-Pharmacy Benefit:  PA was submitted to [facility] Medicaid and has been approved through: 03/11/2023 Authorization# PA Case ID: [id number]  Please send prescription to Specialty Pharmacy: [facility]: [phone]  Estimated Copay is: Zero  Patient Is Not eligible for Xeomin Copay Card, which will make patient's copay as little as zero. Copay card will be provided to pharmacy.  Key: [id number]

## 2022-03-12 ENCOUNTER — Other Ambulatory Visit: Payer: Self-pay

## 2022-03-12 ENCOUNTER — Other Ambulatory Visit (HOSPITAL_COMMUNITY): Payer: Self-pay

## 2022-03-12 MED ORDER — INCOBOTULINUMTOXINA 100 UNITS IM SOLR
300.0000 [IU] | INTRAMUSCULAR | 0 refills | Status: DC
  Filled 2022-03-12 (×2): qty 3, 90d supply, fill #0

## 2022-03-12 NOTE — Telephone Encounter (Signed)
Rx sent to [facility].

## 2022-03-12 NOTE — Addendum Note (Signed)
Addended by: Cristela Felt E on: 03/12/2022 08:11 AM   Modules accepted: Orders

## 2022-03-17 ENCOUNTER — Other Ambulatory Visit (HOSPITAL_COMMUNITY): Payer: Self-pay

## 2022-03-19 NOTE — Telephone Encounter (Signed)
Received (3) 100 units of Xeomin from [facility].

## 2022-04-16 ENCOUNTER — Encounter: Payer: Self-pay | Admitting: *Deleted

## 2022-04-16 NOTE — Progress Notes (Signed)
Received call from pt PCP Joni Reining PA [phone]) wanting to know if pt has been scheduled with our office based on referral sent in February.  Alerted PCP office that our office has attempted x5 to contact pt with no answer and unable to LVM.  Referral has been canceled due to pt not responding to calls. RN confirmed pt number on file as the correct contact number.  PCP office states they will allert Joni Reining PA.

## 2022-04-30 ENCOUNTER — Ambulatory Visit: Payer: Medicaid Other | Admitting: Neurology

## 2022-04-30 VITALS — BP 126/84

## 2022-04-30 DIAGNOSIS — G243 Spasmodic torticollis: Secondary | ICD-10-CM

## 2022-04-30 MED ORDER — INCOBOTULINUMTOXINA 100 UNITS IM SOLR: 300.0000 [IU] | INTRAMUSCULAR | Status: AC
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
## 2022-04-30 NOTE — Progress Notes (Signed)
Xeomin 100 units x 3 vials  [id number] Exp-06/2024 SP   Bacteriostatic 0.9% Sodium Chloride- 4 mL total Lot: [id number] Expiration: 11/25 NDC: 29562-130-86 Dx: .g24.3 S/P  Witnessed by [employer] rn

## 2022-06-02 ENCOUNTER — Other Ambulatory Visit (HOSPITAL_COMMUNITY): Payer: Self-pay

## 2022-07-15 ENCOUNTER — Other Ambulatory Visit: Payer: Self-pay

## 2022-07-15 ENCOUNTER — Other Ambulatory Visit (HOSPITAL_COMMUNITY): Payer: Self-pay

## 2022-07-15 ENCOUNTER — Other Ambulatory Visit: Payer: Self-pay | Admitting: Neurology

## 2022-07-15 MED ORDER — XEOMIN 100 UNITS IM SOLR
300.0000 [IU] | INTRAMUSCULAR | 0 refills | Status: DC
  Filled 2022-07-15: qty 3, 90d supply, fill #0

## 2022-07-23 ENCOUNTER — Other Ambulatory Visit (HOSPITAL_COMMUNITY): Payer: Self-pay

## 2022-07-30 ENCOUNTER — Ambulatory Visit (INDEPENDENT_AMBULATORY_CARE_PROVIDER_SITE_OTHER): Payer: Medicaid Other | Admitting: Neurology

## 2022-07-30 ENCOUNTER — Encounter: Payer: Self-pay | Admitting: Neurology

## 2022-07-30 VITALS — BP 113/76 | HR 103 | Ht 62.0 in | Wt 145.0 lb

## 2022-07-30 DIAGNOSIS — G243 Spasmodic torticollis: Secondary | ICD-10-CM | POA: Diagnosis not present

## 2022-07-30 MED ORDER — INCOBOTULINUMTOXINA 100 UNITS IM SOLR: 300.0000 [IU] | Freq: Once | INTRAMUSCULAR | Status: AC

## 2022-07-30 NOTE — Progress Notes (Signed)
xenomin 100 units x 3 vial  [phone] Exp-06.2026  s/p Bacteriostatic 0.9% Sodium Chloride- 6mL  [id number] Expiration: 11.01.2025 NDC: 7829562130 Dx: G24.3 WITNESSED QM:VHQIO,NG
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
## 2022-09-25 ENCOUNTER — Other Ambulatory Visit (HOSPITAL_COMMUNITY): Payer: Self-pay

## 2022-10-13 ENCOUNTER — Other Ambulatory Visit: Payer: Self-pay

## 2022-10-13 NOTE — Progress Notes (Signed)
Encounter opened before it was realized patient was in wrong neurology branch. Specialty Pharmacy Refill Coordination Note  Kathryn [name] is a 62 y.o. female contacted today regarding refills of specialty medication(s) Incobotulinumtoxina   Patient requested Courier to Provider Office   Delivery date: 10/29/22   Verified address: [facility], [address], [place], [address]   Medication will be filled on 10/28/2022.

## 2022-10-14 ENCOUNTER — Other Ambulatory Visit: Payer: Self-pay | Admitting: Neurology

## 2022-10-14 ENCOUNTER — Other Ambulatory Visit: Payer: Self-pay

## 2022-10-14 MED ORDER — XEOMIN 100 UNITS IM SOLR
300.0000 [IU] | INTRAMUSCULAR | 0 refills | Status: DC
  Filled 2022-10-14: qty 3, 90d supply, fill #0

## 2022-10-15 ENCOUNTER — Other Ambulatory Visit: Payer: Self-pay

## 2022-10-27 ENCOUNTER — Other Ambulatory Visit: Payer: Self-pay

## 2022-11-04 ENCOUNTER — Ambulatory Visit: Payer: Medicaid Other | Admitting: Neurology

## 2022-11-04 VITALS — BP 123/80 | HR 120

## 2022-11-04 DIAGNOSIS — G243 Spasmodic torticollis: Secondary | ICD-10-CM

## 2022-11-04 MED ORDER — INCOBOTULINUMTOXINA 100 UNITS IM SOLR: 300.0000 [IU] | Freq: Once | INTRAMUSCULAR | Status: AC
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
## 2022-11-04 NOTE — Progress Notes (Signed)
xeomin 100units x 3 vial  [phone] Exp-10.2026 s/p Bacteriostatic 0.9% Sodium Chloride- 6mL  [id number] Expiration: 10.2026 NDC: [id number] Dx: G24.3 WITNESSED QM:VHQIO, CMA

## 2022-12-29 ENCOUNTER — Other Ambulatory Visit: Payer: Self-pay

## 2022-12-29 ENCOUNTER — Other Ambulatory Visit: Payer: Self-pay | Admitting: Neurology

## 2022-12-29 MED ORDER — XEOMIN 100 UNITS IM SOLR
300.0000 [IU] | INTRAMUSCULAR | 0 refills | Status: DC
  Filled 2022-12-29 – 2023-01-28 (×2): qty 3, 90d supply, fill #0

## 2022-12-30 ENCOUNTER — Other Ambulatory Visit: Payer: Self-pay

## 2023-01-11 ENCOUNTER — Other Ambulatory Visit: Payer: Self-pay

## 2023-01-28 ENCOUNTER — Other Ambulatory Visit: Payer: Self-pay

## 2023-01-28 NOTE — Progress Notes (Signed)
Specialty Pharmacy Refill Coordination Note  Kathryn [name] is a 63 y.o. female contacted today regarding refills of specialty medication(s) IncobotulinumtoxinA (Xeomin)   Patient requested Courier to Provider Office   Delivery date: 02/08/23   Verified address: [facility], [address] [place], [address]   Medication will be filled on 02/05/23.

## 2023-02-05 ENCOUNTER — Other Ambulatory Visit (HOSPITAL_COMMUNITY): Payer: Self-pay

## 2023-02-05 ENCOUNTER — Other Ambulatory Visit: Payer: Self-pay

## 2023-02-10 ENCOUNTER — Ambulatory Visit: Payer: Medicaid Other | Admitting: Neurology

## 2023-02-10 VITALS — BP 145/87 | HR 110

## 2023-02-10 DIAGNOSIS — G243 Spasmodic torticollis: Secondary | ICD-10-CM | POA: Diagnosis not present

## 2023-02-10 MED ORDER — INCOBOTULINUMTOXINA 100 UNITS IM SOLR: 300.0000 [IU] | INTRAMUSCULAR | Status: AC
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
## 2023-02-10 NOTE — Progress Notes (Signed)
xeomin 100 units x 3 vials [id number] Exp-01/2025 SP   Bacteriostatic 0.9% Sodium Chloride- 1mL  [id number] Expiration: 11/13/2023 NDC: 6213-0865-78 Dx: G24.3 WITNESSED IO:NGEXB J

## 2023-04-13 ENCOUNTER — Telehealth: Payer: Self-pay | Admitting: Neurology

## 2023-04-13 NOTE — Telephone Encounter (Signed)
 Submitted auth request to [facility] via CMM, received approval. Pt will continue to fill through [facility].  Auth#: [id number] (04/13/23-04/12/24)

## 2023-04-27 ENCOUNTER — Other Ambulatory Visit: Payer: Self-pay

## 2023-04-27 ENCOUNTER — Other Ambulatory Visit (HOSPITAL_COMMUNITY): Payer: Self-pay

## 2023-04-27 ENCOUNTER — Other Ambulatory Visit: Payer: Self-pay | Admitting: Neurology

## 2023-04-27 MED ORDER — XEOMIN 100 UNITS IM SOLR
300.0000 [IU] | INTRAMUSCULAR | 0 refills | Status: AC
  Filled 2023-04-27: qty 3, 90d supply, fill #0

## 2023-04-27 NOTE — Progress Notes (Signed)
 Specialty Pharmacy Refill Coordination Note  Kathryn [name] is a 63 y.o. female contacted today regarding refills of specialty medication(s) IncobotulinumtoxinA (XEOMIN)   Patient requested Courier to Provider Office   Delivery date: 05/10/23   Verified address: [facility], [address] [place], [address]   Medication will be filled on 05/07/23.

## 2023-05-05 ENCOUNTER — Telehealth: Payer: Self-pay | Admitting: Neurology

## 2023-05-05 NOTE — Telephone Encounter (Signed)
 Pt LVM at 12:14 pm returning phone call to reschedule Xeomin  appt

## 2023-05-05 NOTE — Telephone Encounter (Signed)
 LVM and sent text msg informing pt of need to reschedule 05/12/23 Xeomin  appointment - MD out

## 2023-05-07 ENCOUNTER — Other Ambulatory Visit: Payer: Self-pay

## 2023-05-12 ENCOUNTER — Ambulatory Visit: Payer: Medicaid Other | Admitting: Neurology

## 2023-05-19 ENCOUNTER — Telehealth: Payer: Self-pay | Admitting: Neurology

## 2023-05-19 ENCOUNTER — Ambulatory Visit: Admitting: Neurology

## 2023-05-19 VITALS — BP 157/94

## 2023-05-19 DIAGNOSIS — G243 Spasmodic torticollis: Secondary | ICD-10-CM | POA: Diagnosis not present

## 2023-05-19 MED ORDER — INCOBOTULINUMTOXINA 100 UNITS IM SOLR: 300.0000 [IU] | INTRAMUSCULAR | Status: AC

## 2023-05-19 NOTE — Telephone Encounter (Signed)
 Please change to lower dose of xeomin  from 300 to 200 units for next injection in August 2025.
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
## 2023-05-19 NOTE — Progress Notes (Signed)
 xeomin  100units x 3 vial  [phone] [id number] Exp-2027/09, 2027/08  s/p Bacteriostatic 0.9% Sodium Chloride - 6mL  [id number]:[id number] Expiration: 11/13/23 NDC: [id number] Dx: Terrel Ferries  WITNESSED ON:GEXBM J RN

## 2023-05-19 NOTE — Progress Notes (Deleted)
 xeomin  100units x 3 vial  [phone] [id number] Exp-2027/08 [id number] Exp-2027/09  s/p Bacteriostatic 0.9% Sodium Chloride - 6mL  YQM:[id number] Expiration: 11/13/23 NDC: [id number] Dx:G24.3

## 2023-05-20 ENCOUNTER — Other Ambulatory Visit: Payer: Self-pay

## 2023-05-20 MED ORDER — XEOMIN 200 UNITS IM SOLR
200.0000 [IU] | INTRAMUSCULAR | 2 refills | Status: AC
  Filled 2023-05-20 – 2023-08-13 (×2): qty 1, 90d supply, fill #0
  Filled 2023-11-22: qty 1, 90d supply, fill #1
  Filled 2024-02-15: qty 1, 90d supply, fill #2

## 2023-05-20 NOTE — Addendum Note (Signed)
 Addended by: Randi Buster on: 05/20/2023 07:20 AM   Modules accepted: Orders

## 2023-05-20 NOTE — Telephone Encounter (Signed)
 Xeomin  200 units to [facility]

## 2023-05-20 NOTE — Telephone Encounter (Signed)
 Please send new rx for 200 units to [facility], thank you.

## 2023-07-26 ENCOUNTER — Other Ambulatory Visit: Payer: Self-pay

## 2023-08-12 ENCOUNTER — Other Ambulatory Visit: Payer: Self-pay

## 2023-08-13 ENCOUNTER — Other Ambulatory Visit: Payer: Self-pay

## 2023-08-13 NOTE — Progress Notes (Signed)
 Specialty Pharmacy Refill Coordination Note  Kathryn [name] is a 63 y.o. female contacted today regarding refills of specialty medication(s) IncobotulinumtoxinA  (Xeomin )   Patient requested Courier to Provider Office   Delivery date: 08/19/23   Verified address: [facility], [address], [place], [address]   Medication will be filled on 08.06.25.

## 2023-08-18 ENCOUNTER — Other Ambulatory Visit: Payer: Self-pay

## 2023-08-25 ENCOUNTER — Encounter: Payer: Self-pay | Admitting: Neurology

## 2023-08-25 ENCOUNTER — Ambulatory Visit: Admitting: Neurology

## 2023-08-25 VITALS — BP 128/72 | Ht 62.0 in | Wt 142.0 lb

## 2023-08-25 DIAGNOSIS — G243 Spasmodic torticollis: Secondary | ICD-10-CM

## 2023-08-25 MED ORDER — INCOBOTULINUMTOXINA 50 UNITS IM SOLR: 50.0000 [IU] | INTRAMUSCULAR | Status: DC

## 2023-08-25 MED ORDER — INCOBOTULINUMTOXINA 200 UNITS IM SOLR: 200.0000 [IU] | Freq: Once | INTRAMUSCULAR | Status: AC

## 2023-08-25 NOTE — Progress Notes (Signed)
 xeomin  200units x 1 vial  [phone] Exp-2027/02  s/p Bacteriostatic 0.9% Sodium Chloride - 4mL  Onu:[id number] Expiration: 11/11/24 NDC: 9590803397 Dx: G24.3  WITNESSED BY:A JOSHUA

## 2023-08-25 NOTE — Progress Notes (Deleted)
 xeomin  50units x 1 vial  [id number] Onu-[id number] Exp-2027/07 s/p Bacteriostatic 0.9% Sodium Chloride - 1mL  Onu:[id number] Expiration: 11/11/24 NDC: [id number] Dx: [place]  WITNESSED BY:A JOSHUA RN
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
## 2023-09-23 LAB — COLOGUARD: COLOGUARD: NEGATIVE

## 2023-11-09 ENCOUNTER — Other Ambulatory Visit: Payer: Self-pay

## 2023-11-22 ENCOUNTER — Other Ambulatory Visit: Payer: Self-pay

## 2023-11-22 NOTE — Progress Notes (Signed)
 Specialty Pharmacy Refill Coordination Note  Kathryn [name] is a 63 y.o. female contacted today regarding refills of specialty medication(s) IncobotulinumtoxinA  (Xeomin )   Patient requested Courier to Provider Office   Delivery date: 11/25/23   Verified address: [facility], [address], [place], [address]   Medication will be filled on: 11/24/23  Copay: $4.00, patient aware and approved copay  Appointment: 11.19.25

## 2023-11-24 ENCOUNTER — Other Ambulatory Visit (HOSPITAL_COMMUNITY): Payer: Self-pay

## 2023-11-24 ENCOUNTER — Other Ambulatory Visit: Payer: Self-pay

## 2023-12-01 ENCOUNTER — Ambulatory Visit: Admitting: Neurology

## 2023-12-01 ENCOUNTER — Encounter: Payer: Self-pay | Admitting: Neurology

## 2023-12-01 VITALS — BP 128/88 | HR 98

## 2023-12-01 DIAGNOSIS — G243 Spasmodic torticollis: Secondary | ICD-10-CM

## 2023-12-01 MED ORDER — INCOBOTULINUMTOXINA 200 UNITS IM SOLR: 200.0000 [IU] | Freq: Once | INTRAMUSCULAR | Status: AC

## 2023-12-01 NOTE — Progress Notes (Signed)
 xeomin  200units x 1 vial  [id number] [phone] Exp-2028/01  s/p Bacteriostatic 0.9% Sodium Chloride - 4mL  Onu:[id number] Expiration: 11/11/24 NDC: [id number] Dx: G24.3  WITNESSED BY:j webb rma
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
## 2024-02-15 ENCOUNTER — Other Ambulatory Visit (HOSPITAL_COMMUNITY): Payer: Self-pay

## 2024-03-08 ENCOUNTER — Ambulatory Visit: Admitting: Neurology
